# Patient Record
Sex: Male | Born: 1960 | Race: Black or African American | Hispanic: No | Marital: Single | Smoking: Current every day smoker
Health system: Southern US, Community
[De-identification: ages and names within clinical notes are randomized; demographics above are authoritative.]

## PROBLEM LIST (undated history)

## (undated) DIAGNOSIS — G40309 Generalized idiopathic epilepsy and epileptic syndromes, not intractable, without status epilepticus: Secondary | ICD-10-CM

## (undated) DIAGNOSIS — R569 Unspecified convulsions: Secondary | ICD-10-CM

## (undated) DIAGNOSIS — I499 Cardiac arrhythmia, unspecified: Secondary | ICD-10-CM

## (undated) DIAGNOSIS — R29818 Other symptoms and signs involving the nervous system: Secondary | ICD-10-CM

## (undated) DIAGNOSIS — Z8249 Family history of ischemic heart disease and other diseases of the circulatory system: Secondary | ICD-10-CM

## (undated) DIAGNOSIS — E785 Hyperlipidemia, unspecified: Secondary | ICD-10-CM

## (undated) DIAGNOSIS — I10 Essential (primary) hypertension: Secondary | ICD-10-CM

## (undated) DIAGNOSIS — I2510 Atherosclerotic heart disease of native coronary artery without angina pectoris: Secondary | ICD-10-CM

## (undated) DIAGNOSIS — K5900 Constipation, unspecified: Secondary | ICD-10-CM

## (undated) HISTORY — DX: Hyperlipidemia, unspecified: E78.5

## (undated) HISTORY — PX: NO PAST SURGERIES: SHX2092

## (undated) HISTORY — PX: OTHER SURGICAL HISTORY: SHX169

---

## 2010-05-06 ENCOUNTER — Emergency Department (HOSPITAL_COMMUNITY): Admission: EM | Admit: 2010-05-06 | Discharge: 2010-05-06 | Payer: Self-pay | Admitting: Emergency Medicine

## 2010-07-21 ENCOUNTER — Emergency Department (HOSPITAL_COMMUNITY): Admission: EM | Admit: 2010-07-21 | Discharge: 2010-07-21 | Payer: Self-pay | Admitting: Emergency Medicine

## 2010-09-11 DIAGNOSIS — G40909 Epilepsy, unspecified, not intractable, without status epilepticus: Secondary | ICD-10-CM | POA: Insufficient documentation

## 2010-09-17 DIAGNOSIS — F70 Mild intellectual disabilities: Secondary | ICD-10-CM | POA: Insufficient documentation

## 2010-09-17 DIAGNOSIS — K036 Deposits [accretions] on teeth: Secondary | ICD-10-CM | POA: Insufficient documentation

## 2010-09-17 DIAGNOSIS — D649 Anemia, unspecified: Secondary | ICD-10-CM | POA: Insufficient documentation

## 2010-09-17 DIAGNOSIS — I10 Essential (primary) hypertension: Secondary | ICD-10-CM | POA: Insufficient documentation

## 2010-10-23 DIAGNOSIS — R82998 Other abnormal findings in urine: Secondary | ICD-10-CM | POA: Insufficient documentation

## 2010-10-23 DIAGNOSIS — G40401 Other generalized epilepsy and epileptic syndromes, not intractable, with status epilepticus: Secondary | ICD-10-CM | POA: Insufficient documentation

## 2010-10-23 DIAGNOSIS — IMO0002 Reserved for concepts with insufficient information to code with codable children: Secondary | ICD-10-CM | POA: Insufficient documentation

## 2010-10-23 DIAGNOSIS — D72819 Decreased white blood cell count, unspecified: Secondary | ICD-10-CM | POA: Insufficient documentation

## 2010-10-23 DIAGNOSIS — D696 Thrombocytopenia, unspecified: Secondary | ICD-10-CM | POA: Insufficient documentation

## 2010-11-09 ENCOUNTER — Emergency Department (HOSPITAL_COMMUNITY)
Admission: EM | Admit: 2010-11-09 | Discharge: 2010-11-09 | Payer: Self-pay | Source: Home / Self Care | Admitting: Emergency Medicine

## 2010-11-12 LAB — POCT I-STAT, CHEM 8
Creatinine, Ser: 1.2 mg/dL (ref 0.4–1.5)
Glucose, Bld: 90 mg/dL (ref 70–99)
HCT: 41 % (ref 39.0–52.0)
Hemoglobin: 13.9 g/dL (ref 13.0–17.0)
TCO2: 30 mmol/L (ref 0–100)

## 2011-01-03 LAB — VALPROIC ACID LEVEL: Valproic Acid Lvl: 27.6 ug/mL — ABNORMAL LOW (ref 50.0–100.0)

## 2011-01-03 LAB — POCT I-STAT, CHEM 8
BUN: 7 mg/dL (ref 6–23)
Chloride: 102 mEq/L (ref 96–112)
Potassium: 4.4 mEq/L (ref 3.5–5.1)
Sodium: 139 mEq/L (ref 135–145)
TCO2: 29 mmol/L (ref 0–100)

## 2011-01-03 LAB — URINALYSIS, ROUTINE W REFLEX MICROSCOPIC
Bilirubin Urine: NEGATIVE
Glucose, UA: NEGATIVE mg/dL
Hgb urine dipstick: NEGATIVE
Ketones, ur: NEGATIVE mg/dL
Protein, ur: NEGATIVE mg/dL
pH: 8 (ref 5.0–8.0)

## 2011-01-05 LAB — BASIC METABOLIC PANEL
CO2: 27 mEq/L (ref 19–32)
Calcium: 9.3 mg/dL (ref 8.4–10.5)
GFR calc Af Amer: >60 mL/min (ref >60)
Potassium: 3.6 mEq/L (ref 3.5–5.1)
Sodium: 136 mEq/L (ref 135–145)

## 2011-01-05 LAB — VALPROIC ACID LEVEL: Valproic Acid Lvl: 12.5 ug/mL — ABNORMAL LOW (ref 50.0–100.0)

## 2011-11-02 ENCOUNTER — Encounter (HOSPITAL_COMMUNITY): Payer: Self-pay | Admitting: *Deleted

## 2011-11-02 ENCOUNTER — Emergency Department (HOSPITAL_COMMUNITY)
Admission: EM | Admit: 2011-11-02 | Discharge: 2011-11-02 | Disposition: A | Discharge status: Home / Self Care | Payer: Medicaid Other | Attending: Emergency Medicine | Admitting: Emergency Medicine

## 2011-11-02 DIAGNOSIS — G40909 Epilepsy, unspecified, not intractable, without status epilepticus: Secondary | ICD-10-CM

## 2011-11-02 DIAGNOSIS — R569 Unspecified convulsions: Secondary | ICD-10-CM | POA: Insufficient documentation

## 2011-11-02 HISTORY — DX: Generalized idiopathic epilepsy and epileptic syndromes, not intractable, without status epilepticus: G40.309

## 2011-11-02 LAB — CBC
HCT: 39.6 % (ref 39.0–52.0)
Platelets: 107 10*3/uL — ABNORMAL LOW (ref 150–400)
RBC: 5.57 MIL/uL (ref 4.22–5.81)
RDW: 16.5 % — ABNORMAL HIGH (ref 11.5–15.5)
WBC: 4.6 10*3/uL (ref 4.0–10.5)

## 2011-11-02 LAB — BASIC METABOLIC PANEL
BUN: 10 mg/dL (ref 6–23)
Chloride: 99 mEq/L (ref 96–112)
GFR calc Af Amer: >90 mL/min (ref >90)
Potassium: 4.1 mEq/L (ref 3.5–5.1)

## 2011-11-02 LAB — VALPROIC ACID LEVEL: Valproic Acid Lvl: 192.6 ug/mL (ref 50.0–100.0)

## 2011-11-02 MED ORDER — LORAZEPAM 1 MG PO TABS
1.0000 mg | ORAL_TABLET | Freq: Once | ORAL | Status: AC
  Administered 2011-11-02: 1 mg via ORAL
  Filled 2011-11-02: qty 1

## 2011-11-02 NOTE — ED Provider Notes (Signed)
History     CSN: [id number]  Arrival date & time 11/02/11  1526   First MD Initiated Contact with Patient 11/02/11 1742      Chief Complaint  Patient presents with  . Seizures    (Consider location/radiation/quality/duration/timing/severity/associated sxs/prior treatment) HPI History provided by pt who is a poor historian.  Pt had a witnessed seizure this evening.  His mother told him that he was unresponsive for approx 5 minutes while sitting in chair.  Did not fall.  No convulsions.  Pt reports that this is typical for him.  He has a seizure approx once a week.  Has not seen a neurologist since moving from [place] 1 year ago.  He is compliant w/ his depakote and keppra which his doctor had prescribed multiple refills for.  Was supposedly taken off of his dilantin last week in the ED but there is no record in prior chart.  No recent illnesses.  Currently feeling well.    Past Medical History  Diagnosis Date  . Epilepsy seizure, nonconvulsive, generalized     No past surgical history on file.  History reviewed. No pertinent family history.  History  Substance Use Topics  . Smoking status: Never Smoker   . Smokeless tobacco: Not on file  . Alcohol Use: No      Review of Systems  All other systems reviewed and are negative.    Allergies  Review of patient's allergies indicates no known allergies.  Home Medications   Current Outpatient Rx  Name Route Sig Dispense Refill  . DIVALPROEX SODIUM 500 MG PO TBEC Oral Take 500 mg by mouth 3 (three) times daily.    Marland Kitchen LEVETIRACETAM 1000 MG PO TABS Oral Take 1,000 mg by mouth 2 (two) times daily.      BP 125/79  Pulse 64  Temp(Src) 98.3 F (36.8 C) (Oral)  Resp 18  SpO2 97%  Physical Exam  Nursing note and vitals reviewed. Constitutional: He is oriented to person, place, and time. He appears well-developed and well-nourished. No distress.  HENT:  Head: Normocephalic and atraumatic.  Eyes:       Normal appearance    Neck: Normal range of motion.  Cardiovascular: Normal rate and regular rhythm.   Pulmonary/Chest: Effort normal and breath sounds normal.  Neurological: He is alert and oriented to person, place, and time. He has normal strength. No sensory deficit. Coordination normal. GCS eye subscore is 4. GCS verbal subscore is 5. GCS motor subscore is 6.  Skin: Skin is warm and dry. No rash noted.  Psychiatric: He has a normal mood and affect. His behavior is normal.    ED Course  Procedures (including critical care time)  Labs Reviewed  CBC - Abnormal; Notable for the following:    MCV 71.1 (*)    MCH 23.3 (*)    RDW 16.5 (*)    Platelets 107 (*) PLATELET COUNT CONFIRMED BY SMEAR   All other components within normal limits  BASIC METABOLIC PANEL - Abnormal; Notable for the following:    GFR calc non Af Amer 85 (*)    All other components within normal limits  VALPROIC ACID LEVEL - Abnormal; Notable for the following:    Valproic Acid Lvl 192.6 (*)    All other components within normal limits   No results found.   1. Seizure disorder       MDM  Pt w/ h/o seizure disorder presents w/ c/o typical seizure.  Has not seen a neurologist since  moving here from [place] 9yr ago but had several refills of all medications.  No acute findings on exam.  Labs unremarkable w/ exception of elevated valproic acid level.  Has been taking three 500mg  depakote TID but it has been prescribed one 500mg  TID.  No acidosis or electrolyte abnormalities.  Explained to patient how he should take his medication and recommended close f/u with neurology.  D/c'd home w/ referral to [facility].  Return precautions discussed.         Otilio Miu, [place] 11/03/11 [phone]

## 2011-11-02 NOTE — ED Notes (Signed)
Pharmacy Tech at bedside to reconcile medication.

## 2011-11-02 NOTE — ED Notes (Signed)
PA at bedside.

## 2011-11-02 NOTE — ED Notes (Signed)
Spoke with pt's niece Seth Brock) who will be coming to pick pt up.  Contact information: [phone]

## 2011-11-02 NOTE — ED Notes (Signed)
Patient with epileptic seizures and states that he blacked out for about 5 minutes this morning.  Patient is alert and oriented x 3.  Patient wants to make sure that he is medically ok.  Ambulatory at triage.

## 2011-11-02 NOTE — ED Notes (Signed)
Critical Lab value: Valproic Acid 192.6 from [facility].  Will notify MD

## 2011-11-02 NOTE — ED Notes (Signed)
PA Schinlever has been notified regarding critical lab value (Valproic Acid)

## 2011-11-03 NOTE — ED Provider Notes (Signed)
Medical screening examination/treatment/procedure(s) were performed by non-physician practitioner and as supervising physician I was immediately available for consultation/collaboration.  Loren Racer, MD 11/03/11 2328

## 2012-03-15 ENCOUNTER — Encounter (HOSPITAL_COMMUNITY): Payer: Self-pay | Admitting: Emergency Medicine

## 2012-03-15 ENCOUNTER — Emergency Department (HOSPITAL_COMMUNITY)
Admission: EM | Admit: 2012-03-15 | Discharge: 2012-03-15 | Disposition: A | Discharge status: Home / Self Care | Payer: Medicaid Other | Attending: Emergency Medicine | Admitting: Emergency Medicine

## 2012-03-15 DIAGNOSIS — G40909 Epilepsy, unspecified, not intractable, without status epilepticus: Secondary | ICD-10-CM | POA: Insufficient documentation

## 2012-03-15 DIAGNOSIS — R7889 Finding of other specified substances, not normally found in blood: Secondary | ICD-10-CM

## 2012-03-15 DIAGNOSIS — R569 Unspecified convulsions: Secondary | ICD-10-CM

## 2012-03-15 DIAGNOSIS — R7989 Other specified abnormal findings of blood chemistry: Secondary | ICD-10-CM | POA: Insufficient documentation

## 2012-03-15 DIAGNOSIS — Z79899 Other long term (current) drug therapy: Secondary | ICD-10-CM | POA: Insufficient documentation

## 2012-03-15 LAB — VALPROIC ACID LEVEL: Valproic Acid Lvl: 140.4 ug/mL — ABNORMAL HIGH (ref 50.0–100.0)

## 2012-03-15 LAB — BASIC METABOLIC PANEL
BUN: 11 mg/dL (ref 6–23)
CO2: 27 mEq/L (ref 19–32)
Calcium: 9.5 mg/dL (ref 8.4–10.5)
Chloride: 102 mEq/L (ref 96–112)
Creatinine, Ser: 1.03 mg/dL (ref 0.50–1.35)
GFR calc Af Amer: >90 mL/min (ref >90)
GFR calc non Af Amer: 84 mL/min — ABNORMAL LOW (ref >90)
Glucose, Bld: 79 mg/dL (ref 70–99)
Potassium: 4.6 mEq/L (ref 3.5–5.1)
Sodium: 137 mEq/L (ref 135–145)

## 2012-03-15 NOTE — ED Provider Notes (Signed)
History    50yM with seizure. Pt with hx of seizure disorder. Remembers going to store to get food today and then next thing remembers is people around him. Amnestic to exact events. Currently no complaints. Reports compliance with keppra and dilantin. No fever. No trauma. No numbness, tingling or loss of strength. Denies pain anwyhere. No neck stiffness.   CSN: [id number]  Arrival date & time 03/15/12  1357   First MD Initiated Contact with Patient 03/15/12 1455      Chief Complaint  Patient presents with  . Seizures    (Consider location/radiation/quality/duration/timing/severity/associated sxs/prior treatment) HPI  Past Medical History  Diagnosis Date  . Epilepsy seizure, nonconvulsive, generalized     No past surgical history on file.  No family history on file.  History  Substance Use Topics  . Smoking status: Never Smoker   . Smokeless tobacco: Not on file  . Alcohol Use: No      Review of Systems   Review of symptoms negative unless otherwise noted in HPI.   Allergies  Review of patient's allergies indicates no known allergies.  Home Medications   Current Outpatient Rx  Name Route Sig Dispense Refill  . DIVALPROEX SODIUM 500 MG PO TBEC Oral Take 500 mg by mouth 3 (three) times daily.    Marland Kitchen LEVETIRACETAM 1000 MG PO TABS Oral Take 2,000 mg by mouth 2 (two) times daily.       BP 124/74  Pulse 48  Temp(Src) 98.4 F (36.9 C) (Oral)  Resp 20  SpO2 99%  Physical Exam  Nursing note and vitals reviewed. Constitutional: He appears well-developed and well-nourished. No distress.       Laying in bed watching tv. NAD.No external signs of acute trauma.  HENT:  Head: Normocephalic and atraumatic.  Eyes: Conjunctivae and EOM are normal. Pupils are equal, round, and reactive to light. Right eye exhibits no discharge. Left eye exhibits no discharge.  Neck: Normal range of motion. Neck supple.  Cardiovascular: Normal rate, regular rhythm and normal heart sounds.   Exam reveals no gallop and no friction rub.   No murmur heard. Pulmonary/Chest: Effort normal and breath sounds normal. No respiratory distress.  Abdominal: Soft. He exhibits no distension. There is no tenderness.  Musculoskeletal: He exhibits no edema and no tenderness.  Lymphadenopathy:    He has no cervical adenopathy.  Neurological: He is alert. No cranial nerve deficit. He exhibits normal muscle tone. Coordination normal.       Strength 5/5 b/l u/l ext. Good finger to nose b/l.  Skin: Skin is warm and dry.  Psychiatric: Thought content normal.       Appears to have MR. Answering questions appropriately though.    ED Course  Procedures (including critical care time)  Labs Reviewed  BASIC METABOLIC PANEL - Abnormal; Notable for the following:    GFR calc non Af Amer 84 (*)    All other components within normal limits  VALPROIC ACID LEVEL - Abnormal; Notable for the following:    Valproic Acid Lvl 140.4 (*)    All other components within normal limits  LAB REPORT - SCANNED   No results found.   1. Elevated Dilantin level   2. Seizure       MDM  50yM with seizure. Hx of same. Back to baseline and nonfocal exam. Dilantin supratherapeutic. Noted on previous eval but lower than that evaluation. W/u otherwise failry unremarkable. Pt reports taking meds as prescribed. Instructed to decrease dose for a couple days  and then resume as prescribed. Neurology referral provided for close fu. Return precautions discussed.        Raeford Razor, MD 03/18/12 [phone]

## 2012-03-15 NOTE — ED Notes (Signed)
Pt's mother was notified that pt was in the ER.  Mother told this writer to let her know when he is on his way home.

## 2012-03-15 NOTE — ED Notes (Signed)
[id number]<AV> Expected date:<BR> Expected time: 1:42 PM<BR> Means of arrival:Ambulance<BR> Comments:<BR> M90 -- Seizure

## 2012-03-15 NOTE — ED Notes (Signed)
Per EMS: Pt coming from grocery store.  Has developmental delays.  Hx of seizures.  Lives with parents.  Takes dilantin, depakote, and capri for his seizures.  Witnessed seizure about a minute long.  Pt woke up and was alert but could not answer questions.  Able to answer questions about 10 minutes after seizure subsided.

## 2012-03-15 NOTE — Discharge Instructions (Signed)
Epilepsy  A seizure (convulsion) is a sudden change in brain function that causes a change in behavior, muscle activity, or ability to remain awake and alert. If a person has recurring seizures, this is called epilepsy.  CAUSES   Epilepsy is a disorder with many possible causes. Anything that disturbs the normal pattern of brain cell activity can lead to seizures. Seizure can be caused from illness to brain damage to abnormal brain development. Epilepsy may develop because of:   An abnormality in brain wiring.   An imbalance of nerve signaling chemicals (neurotransmitters).   Some combination of these factors.  Scientists are learning an increasing amount about genetic causes of seizures.  SYMPTOMS   The symptoms of a seizure can vary greatly from one person to another. These may include:   An aura, or warning that tells a person they are about to have a seizure.   Abnormal sensations, such as abnormal smell or seeing flashing lights.   Sudden, general body stiffness.   Rhythmic jerking of the face, arm, or leg - on one or both sides.   Sudden change in consciousness.   The person may appear to be awake but not responding.   They may appear to be asleep but cannot be awakened.   Grimacing, chewing, lip smacking, or drooling.   Often there is a period of sleepiness after a seizure.  DIAGNOSIS   The description you give to your caregiver about what you experienced will help them understand your problems. Equally important is the description by any witnesses to your seizure. A physical exam, including a detailed neurological exam, is necessary. An EEG (electroencephalogram) is a painless test of your brain waves. In this test a diagram is created of your brain waves. These diagrams can be interpreted by a specialist. Pictures of your brain are usually taken with:   An MRI.   A CT scan.  Lab tests may be done to look for:   Signs of infection.   Abnormal blood chemistry.  PREVENTION   There is no way to  prevent the development of epilepsy. If you have seizures that are typically triggered by an event (such as flashing lights), try to avoid the trigger. This can help you avoid a seizure.   PROGNOSIS   Most people with epilepsy lead outwardly normal lives. While epilepsy cannot currently be cured, for some people it does eventually go away. Most seizures do not cause brain damage. It is not uncommon for people with epilepsy, especially children, to develop behavioral and emotional problems. These problems are sometimes the consequence of medicine for seizures or social stress. For some people with epilepsy, the risk of seizures restricts their independence and recreational activities. For example, some states refuse drivers licenses to people with epilepsy.  Most women with epilepsy can become pregnant. They should discuss their epilepsy and the medicine they are taking with their caregivers. Women with epilepsy have a 90 percent or better chance of having a normal, healthy baby.  RISKS AND COMPLICATIONS   People with epilepsy are at increased risk of falls, accidents, and injuries. People with epilepsy are at special risk for two life-threatening conditions. These are status epilepticus and sudden unexplained death (extremely rare). Status epilepticus is a long lasting, continuous seizure that is a medical emergency.  TREATMENT   Once epilepsy is diagnosed, it is important to begin treatment as soon as possible. For about 80 percent of those diagnosed with epilepsy, seizures can be controlled with modern medicines   FDA approved a pacemaker for the brain the (vagus nerve stimulator). This stimulator can be used for people with seizures that are not well-controlled by medicine. Studies have shown that in some cases, children may experience fewer seizures if they maintain a  strict diet. The strict diet is called the ketogenic diet. This diet is rich in fats and low in carbohydrates. HOME CARE INSTRUCTIONS   Your caregiver will make recommendations about driving and safety in normal activities. Follow these carefully.   Take any medicine prescribed exactly as directed.   Do any blood tests requested to monitor the levels of your medicine.   The people you live and work with should know that you are prone to seizures. They should receive instructions on how to help you. In general, a witness to a seizure should:   Cushion your head and body.   Turn you on your side.   Avoid unnecessarily restraining you.   Not place anything inside your mouth.   Call for local emergency medical help if there is any question about what has occurred.   Keep a seizure diary. Record what you recall about any seizure, especially any possible trigger.   If your caregiver has given you a follow-up appointment, it is very important to keep that appointment. Not keeping the appointment could result in permanent injury and disability. If there is any problem keeping the appointment, you must call back to this facility for assistance.  SEEK MEDICAL CARE IF:   You develop signs of infection or other illness. This might increase the risk of a seizure.   You seem to be having more frequent seizures.   Your seizure pattern is changing.  SEEK IMMEDIATE MEDICAL CARE IF:   A seizure does not stop after a few moments.   A seizure causes any difficulty in breathing.   A seizure results in a very severe headache.   A seizure leaves you with the inability to speak or use a part of your body.  MAKE SURE YOU:   Understand these instructions.   Will watch your condition.   Will get help right away if you are not doing well or get worse.  Document Released: 10/07/2005 Document Revised: 09/26/2011 Document Reviewed: 05/13/2008 [facility] Patient Information 2012 [place],  [place].  RESOURCE GUIDE  Dental Problems  Patients with Medicaid: [facility] [phone] [address]                                           [phone] [address] Phone:  [phone]                                                  Phone:  [phone]  If unable to pay or uninsured, contact:  [facility] or [facility]. to become qualified for the adult dental clinic.  Chronic Pain Problems Contact [facility]  [phone] Patients need to be referred by their primary care doctor.  Insufficient Money for Medicine Contact [employer]:  call "211" or [facility] (801) 172-0986.  No Primary Care Doctor Call [facility]  [phone] Other agencies that provide inexpensive medical care    [facility]  [phone]    [facility]  [phone]    [facility]  [phone]    [facility]  [phone]    [facility]  229-009-2014    [facility]  [phone]  Psychological Services [facility] Behavioral Health  [phone] [facility]  [phone] [facility]   [phone] (emergency services [phone])  Substance Abuse Resources [facility]  ([phone] [facility] [phone] [facility] [place] [phone] Floydene Flock [phone] Residential & Outpatient Substance Abuse Program  7314342224  Abuse/Neglect [facility] Child Abuse Hotline 959-085-4901 [facility] Child Abuse Hotline [phone] (After Hours)  Emergency Shelter [facility] 801-203-9372  M[facility] Homes [facility] [facility] [phone] [facility] ([phone]  MRSA Hotline #:   203-061-9396    [facility] Resources  [facility]     [employer]                          [facility] Dept. [address] [place]                       [address]      [address] [place] [address]  Blondell Reveal Phone:  [phone]                                   Phone:  [phone]                 Phone:  [phone]  [facility] Mental Health Phone:  [phone]  [facility] Child Abuse Hotline 717-460-3502 [phone] (After Hours)

## 2012-10-19 ENCOUNTER — Emergency Department (HOSPITAL_COMMUNITY)
Admission: EM | Admit: 2012-10-19 | Discharge: 2012-10-19 | Disposition: A | Discharge status: Home / Self Care | Payer: Medicaid Other | Attending: Emergency Medicine | Admitting: Emergency Medicine

## 2012-10-19 ENCOUNTER — Encounter (HOSPITAL_COMMUNITY): Payer: Self-pay | Admitting: *Deleted

## 2012-10-19 DIAGNOSIS — G40909 Epilepsy, unspecified, not intractable, without status epilepticus: Secondary | ICD-10-CM | POA: Insufficient documentation

## 2012-10-19 DIAGNOSIS — Z79899 Other long term (current) drug therapy: Secondary | ICD-10-CM | POA: Insufficient documentation

## 2012-10-19 DIAGNOSIS — Y9389 Activity, other specified: Secondary | ICD-10-CM | POA: Insufficient documentation

## 2012-10-19 DIAGNOSIS — X58XXXA Exposure to other specified factors, initial encounter: Secondary | ICD-10-CM | POA: Insufficient documentation

## 2012-10-19 DIAGNOSIS — IMO0002 Reserved for concepts with insufficient information to code with codable children: Secondary | ICD-10-CM | POA: Insufficient documentation

## 2012-10-19 DIAGNOSIS — R569 Unspecified convulsions: Secondary | ICD-10-CM

## 2012-10-19 DIAGNOSIS — Y929 Unspecified place or not applicable: Secondary | ICD-10-CM | POA: Insufficient documentation

## 2012-10-19 LAB — PHENYTOIN LEVEL, TOTAL: Phenytoin Lvl: <2.5 ug/mL — ABNORMAL LOW (ref 10.0–20.0)

## 2012-10-19 LAB — BASIC METABOLIC PANEL
BUN: 13 mg/dL (ref 6–23)
CO2: 28 mEq/L (ref 19–32)
Calcium: 9.6 mg/dL (ref 8.4–10.5)
GFR calc non Af Amer: >90 mL/min (ref >90)
Glucose, Bld: 95 mg/dL (ref 70–99)

## 2012-10-19 LAB — CBC
Hemoglobin: 12.1 g/dL — ABNORMAL LOW (ref 13.0–17.0)
MCH: 24.7 pg — ABNORMAL LOW (ref 26.0–34.0)
MCHC: 32.8 g/dL (ref 30.0–36.0)
MCV: 75.5 fL — ABNORMAL LOW (ref 78.0–100.0)
Platelets: 202 10*3/uL (ref 150–400)
RBC: 4.89 MIL/uL (ref 4.22–5.81)

## 2012-10-19 MED ORDER — PHENYTOIN SODIUM EXTENDED 30 MG PO CAPS
250.0000 mg | ORAL_CAPSULE | Freq: Once | ORAL | Status: AC
  Administered 2012-10-19: 250 mg via ORAL
  Filled 2012-10-19: qty 5

## 2012-10-19 NOTE — ED Notes (Signed)
Pt's Aunt called on his cell phone, pt instructed me to answer and talk to the person on the phone. Informed pt's aunt that he had a seizure and was found on the sidewalk and EMS brought pt to ED for further evaluation.

## 2012-10-19 NOTE — ED Notes (Signed)
[id number]<AO> Expected date:<BR> Expected time:<BR> Means of arrival:<BR> Comments:<BR> EMS

## 2012-10-19 NOTE — ED Notes (Signed)
ZOX:[id number]<AV> Expected date:<BR> Expected time:<BR> Means of arrival:<BR> Comments:<BR> Triage 4

## 2012-10-19 NOTE — ED Notes (Signed)
Per EMS states someone driving by saw him laying on side walk shaking and called 911, lasted 30 sec-1 min, was walking when EMS arrived, was post-ictal, hx of seizure, last seizure was last month, usually has one every month BP 128/76, HR 82, CBG 105, RR 18. Takes Dilantin and Phenobarbital.

## 2012-10-19 NOTE — ED Notes (Signed)
Pt states he was outside and fell on side walk and had a seizure, denies pain, denies headache/blurred vision/dizziness. Abrasions on knuckles on hands.

## 2012-10-19 NOTE — ED Provider Notes (Signed)
History     CSN: [id number]  Arrival date & time 10/19/12  1708   First MD Initiated Contact with Patient 10/19/12 1817      Chief Complaint  Patient presents with  . Seizures    (Consider location/radiation/quality/duration/timing/severity/associated sxs/prior treatment) Patient is a 51 y.o. male presenting with seizures. The history is provided by the patient, medical records and the EMS personnel.  Seizures  Pertinent negatives include no headaches, no visual disturbance, no chest pain, no cough, no nausea, no vomiting and no diarrhea.    Seth Brock is a 51 y.o. male  with a hx of epilepsy presents to the Emergency Department complaining of acute, spontaneously resolved seizure onset 5pm.  Pt states he was walking on the sidewalk when he blacked out and had a seizure.  He states this happens approximately 1x per month. He does not get an aura and he has never had to come to the hospital for seizures. Associated symptoms include  None. Pt takes Keppra and Dilantin for his seizures, has been compliant and did take them today. Nothing makes it better and nothing makes it worse.  Pt denies headache, neck pain, back pain, chest pain, shortness of breath, abdominal pain, nausea, vomiting, diarrhea, fever or chills.  Pt's mother goes with him to the doctor.  He states he feels good.     Past Medical History  Diagnosis Date  . Epilepsy seizure, nonconvulsive, generalized     History reviewed. No pertinent past surgical history.  No family history on file.  History  Substance Use Topics  . Smoking status: Never Smoker   . Smokeless tobacco: Not on file  . Alcohol Use: No      Review of Systems  Constitutional: Negative for fever, diaphoresis, appetite change, fatigue and unexpected weight change.  HENT: Negative for mouth sores, neck pain and neck stiffness.   Eyes: Negative for visual disturbance.  Respiratory: Negative for cough, chest tightness, shortness of breath and  wheezing.   Cardiovascular: Negative for chest pain.  Gastrointestinal: Negative for nausea, vomiting, abdominal pain, diarrhea and constipation.  Genitourinary: Negative for dysuria, urgency, frequency and hematuria.  Musculoskeletal: Negative for back pain.  Skin: Negative for rash.  Neurological: Positive for seizures. Negative for syncope, light-headedness and headaches.  Psychiatric/Behavioral: Negative for sleep disturbance. The patient is not nervous/anxious.   All other systems reviewed and are negative.    Allergies  Review of patient's allergies indicates no known allergies.  Home Medications   Current Outpatient Rx  Name  Route  Sig  Dispense  Refill  . DIVALPROEX SODIUM 500 MG PO TBEC   Oral   Take 500 mg by mouth 3 (three) times daily.         Marland Kitchen LACOSAMIDE 100 MG PO TABS   Oral   Take 1 tablet by mouth 2 (two) times daily.         Marland Kitchen LEVETIRACETAM 1000 MG PO TABS   Oral   Take 2,000 mg by mouth 2 (two) times daily.            BP 120/79  Pulse 62  Temp 99.2 F (37.3 C) (Oral)  Resp 21  SpO2 96%  Physical Exam  Nursing note and vitals reviewed. Constitutional: He is oriented to person, place, and time. He appears well-developed and well-nourished. No distress.  HENT:  Head: Normocephalic and atraumatic.  Right Ear: Tympanic membrane, external ear and ear canal normal.  Left Ear: Tympanic membrane, external ear and ear canal  normal.  Nose: Nose normal.  Mouth/Throat: Uvula is midline, oropharynx is clear and moist and mucous membranes are normal. No oropharyngeal exudate.  Eyes: Conjunctivae normal and EOM are normal. Pupils are equal, round, and reactive to light. No scleral icterus.  Neck: Normal range of motion. Neck supple.  Cardiovascular: Normal rate, regular rhythm, normal heart sounds and intact distal pulses.  Exam reveals no gallop and no friction rub.   No murmur heard. Pulmonary/Chest: Effort normal and breath sounds normal. No  respiratory distress. He has no wheezes. He has no rales. He exhibits no tenderness.  Abdominal: Soft. Bowel sounds are normal. He exhibits no distension and no mass. There is no tenderness. There is no rebound and no guarding.  Musculoskeletal: Normal range of motion. He exhibits no edema.       Arms:      Minor abrasions to the dorsum of the right hand  Lymphadenopathy:    He has no cervical adenopathy.  Neurological: He is alert and oriented to person, place, and time. He has normal reflexes. No cranial nerve deficit. He exhibits normal muscle tone. Coordination normal.       Speech is clear and goal oriented, follows commands Major Cranial nerves without deficit, no facial droop Normal strength in upper and lower extremities bilaterally including dorsiflexion and plantar flexion, strong and equal grip strength Sensation normal to light and sharp touch Moves extremities without ataxia, coordination intact Normal finger to nose and rapid alternating movements Neg romberg, no pronator drift Normal gait and balance  Skin: Skin is warm and dry. No rash noted. He is not diaphoretic. No erythema.  Psychiatric: He has a normal mood and affect.    ED Course  Procedures (including critical care time)  Labs Reviewed  CBC - Abnormal; Notable for the following:    WBC 3.3 (*)     Hemoglobin 12.1 (*)     HCT 36.9 (*)     MCV 75.5 (*)     MCH 24.7 (*)     All other components within normal limits  PHENYTOIN LEVEL, TOTAL - Abnormal; Notable for the following:    Phenytoin Lvl <2.5 (*)     All other components within normal limits  PHENOBARBITAL LEVEL - Abnormal; Notable for the following:    Phenobarbital <2.4 (*)     All other components within normal limits  BASIC METABOLIC PANEL   No results found.   1. Seizures       MDM  [name] presents with a Hx of seizures and a witnessed seizure today.  Pt is without complaint, alert and oriented, NAD, non-toxic, non-septic appearing,  stating he is ready to go home.  Awaiting dilantin levels.  Neurologist is Levert Feinstein, MD.    CBC with mild leukopenia and mild anemia the patient's baseline; CMP unremarkable. Patient Dilantin level low, less than 2.5.  Will load him with 500 mg here and have him repeat in 2 hours when he returns home.  Also discussed with him the need to followup with his neurologist.  I have also discussed reasons to return immediately to the ER.  Patient expresses understanding and agrees with plan.  1. Medications: Dilantin 500mg  by mouth when you get home then usual home medications as prescribed 2. Treatment: rest, drink plenty of fluids, take medications as prescribed  3. Follow Up: Please followup with your primary doctor for discussion of your diagnoses and further evaluation after today's visit; if you do not have a primary care doctor  use the resource guide provided to find one; follow-up with your neurologlist        Dierdre Forth, PA-C 10/19/12 2020

## 2012-10-19 NOTE — ED Notes (Signed)
Waiting on Dilantin from Pharmacy

## 2012-10-20 NOTE — ED Provider Notes (Signed)
Medical screening examination/treatment/procedure(s) were performed by non-physician practitioner and as supervising physician I was immediately available for consultation/collaboration.  Hurman Horn, MD 10/20/12 [phone]

## 2013-01-05 ENCOUNTER — Telehealth: Payer: Self-pay | Admitting: *Deleted

## 2013-01-05 NOTE — Telephone Encounter (Signed)
Seth Brock, I called [name] and she was unavailable. I changed his Vimpat to 150mg  BID on March 4th. It was called to his pharmacy. He needs to increase his dose to that.

## 2013-01-06 MED ORDER — DIVALPROEX SODIUM 500 MG PO DR TAB: 500.0000 mg | DELAYED_RELEASE_TABLET | Freq: Two times a day (BID) | ORAL | Status: DC

## 2013-01-06 NOTE — Telephone Encounter (Signed)
Spoke to the aunt and she is stating that the patient is taking 100 mg of the Vimpat. She also stated that when the pt went to pick up his medication he asked for the 100 mg because that's what the NP has told him to take. The aunt is very confused about what he is suppose to be taking. She will call the patient to find out what he actually picked up.

## 2013-01-06 NOTE — Telephone Encounter (Signed)
I thought she was the person responsible for his taking the meds appropriately. Please let me know if he did not pick up right RX.when she calls you back.

## 2013-01-07 NOTE — Telephone Encounter (Signed)
The pt aunt called back and said she called the pharmacy and the 150 has not been approved yet. The pt is still taking the 100 mg and when he gets the other one approved he will start taking that.

## 2013-01-25 DIAGNOSIS — Z0289 Encounter for other administrative examinations: Secondary | ICD-10-CM

## 2013-02-09 ENCOUNTER — Encounter: Payer: Self-pay | Admitting: *Deleted

## 2013-02-09 NOTE — Telephone Encounter (Signed)
Patient aunt is calling wanting to know if the patient form was received and filled out. She called yesterday and someone called her back but she missed the call. Please advise.

## 2013-02-10 NOTE — Telephone Encounter (Signed)
Patient is calling again about a form. Please call the patient.

## 2013-02-10 NOTE — Telephone Encounter (Signed)
This encounter was created in error - please disregard.

## 2013-03-03 ENCOUNTER — Other Ambulatory Visit: Payer: Self-pay | Admitting: Neurology

## 2013-03-10 ENCOUNTER — Telehealth: Payer: Self-pay

## 2013-03-10 NOTE — Telephone Encounter (Signed)
I do not want pt on Delayed release, I want him on ER so he gets better coverage.

## 2013-03-10 NOTE — Telephone Encounter (Signed)
I called the pharmacy.  Spoke with Robin.  She said the patients mom said she and the patient were getting confused with the medication.  Says they did refill the ER and patient picked up meds.  They are continuing ER.  I called patient, got no answer.  Left message.

## 2013-03-10 NOTE — Telephone Encounter (Signed)
[facility] [place] [facility]ing saying the patient requested they contact us for a drug change.  Patient told them he has taken Depakote DR (Delayed Release) in the past and he "likes it better" than the ER (Extended Release) we have been prescribing since June 2013 (according to [employer]).  It is listed in Epic as DR, although that is not what we have been sending.  Would you like to change medication?  Please advise.  Thank you.

## 2013-04-01 ENCOUNTER — Ambulatory Visit: Payer: Self-pay | Admitting: Nurse Practitioner

## 2013-06-08 ENCOUNTER — Telehealth: Payer: Self-pay | Admitting: Nurse Practitioner

## 2013-06-11 ENCOUNTER — Ambulatory Visit (INDEPENDENT_AMBULATORY_CARE_PROVIDER_SITE_OTHER): Payer: Medicaid Other | Admitting: Nurse Practitioner

## 2013-06-11 ENCOUNTER — Encounter: Payer: Self-pay | Admitting: Nurse Practitioner

## 2013-06-11 VITALS — BP 133/77 | HR 61 | Ht 72.0 in | Wt 254.0 lb

## 2013-06-11 DIAGNOSIS — R569 Unspecified convulsions: Secondary | ICD-10-CM

## 2013-06-11 DIAGNOSIS — Z79899 Other long term (current) drug therapy: Secondary | ICD-10-CM

## 2013-06-11 DIAGNOSIS — G40209 Localization-related (focal) (partial) symptomatic epilepsy and epileptic syndromes with complex partial seizures, not intractable, without status epilepticus: Secondary | ICD-10-CM

## 2013-06-11 MED ORDER — DIVALPROEX SODIUM ER 500 MG PO TB24: 500.0000 mg | ORAL_TABLET | Freq: Two times a day (BID) | ORAL | Status: DC

## 2013-06-11 NOTE — Patient Instructions (Addendum)
Continue Vimpat 150 twice daily Continue Keppra 1000 mg 2 tabs twice daily Continue Depakote 500 ER one twice daily Will obtain labs Followup in 6 months
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
## 2013-06-15 LAB — COMPREHENSIVE METABOLIC PANEL
ALT: 28 IU/L (ref 0–44)
AST: 25 IU/L (ref 0–40)
Alkaline Phosphatase: 52 IU/L (ref 39–117)
BUN/Creatinine Ratio: 9 (ref 9–20)
CO2: 23 mmol/L (ref 18–29)
Calcium: 9.8 mg/dL (ref 8.7–10.2)
Chloride: 101 mmol/L (ref 96–108)
Potassium: 4.5 mmol/L (ref 3.5–5.2)
Sodium: 138 mmol/L (ref 134–144)

## 2013-06-15 LAB — CBC WITH DIFFERENTIAL/PLATELET
Basophils Absolute: 0 10*3/uL (ref 0.0–0.2)
Basos: 1 % (ref 0–3)
Eosinophils Absolute: 0.1 10*3/uL (ref 0.0–0.4)
Hemoglobin: 11.9 g/dL — ABNORMAL LOW (ref 12.6–17.7)
Lymphs: 62 % — ABNORMAL HIGH (ref 14–46)
MCH: 23.9 pg — ABNORMAL LOW (ref 26.6–33.0)
MCHC: 34 g/dL (ref 31.5–35.7)
MCV: 70 fL — ABNORMAL LOW (ref 79–97)
Monocytes Absolute: 0.3 10*3/uL (ref 0.1–0.9)
Neutrophils Absolute: 0.7 10*3/uL — ABNORMAL LOW (ref 1.4–7.0)
RBC: 4.97 x10E6/uL (ref 4.14–5.80)

## 2013-06-15 LAB — VALPROIC ACID LEVEL: Valproic Acid Lvl: 104 ug/mL — ABNORMAL HIGH (ref 50–100)

## 2013-06-18 NOTE — Progress Notes (Signed)
Quick Note:  Spoke with patient and relayed results of blood work. The patient was also reminded of any future appointments. Patient understood and had no questions.  ______ 

## 2013-07-09 ENCOUNTER — Other Ambulatory Visit: Payer: Self-pay | Admitting: Nurse Practitioner

## 2013-07-12 ENCOUNTER — Telehealth: Payer: Self-pay | Admitting: Nurse Practitioner

## 2013-07-12 NOTE — Telephone Encounter (Signed)
Rx signed and faxed.

## 2013-07-12 NOTE — Telephone Encounter (Signed)
Duplicate Message.  This was already taken care of.

## 2013-07-20 ENCOUNTER — Telehealth: Payer: Self-pay | Admitting: Nurse Practitioner

## 2013-07-20 NOTE — Telephone Encounter (Signed)
Rx was already sent last week.  We have faxed confirmation.  I called the patient back, was told by the lady who answered the phone he was not at home, he was actually at our office.  I called the front desk, they verified he is here.  They will relay message to the patient that Rx was already sent last week and he should follow up with his pharmacy regarding when it is ready for pick up.

## 2013-08-15 ENCOUNTER — Emergency Department (INDEPENDENT_AMBULATORY_CARE_PROVIDER_SITE_OTHER)
Admission: EM | Admit: 2013-08-15 | Discharge: 2013-08-15 | Disposition: A | Discharge status: Home / Self Care | Payer: Medicaid Other | Source: Home / Self Care | Attending: Family Medicine | Admitting: Family Medicine

## 2013-08-15 ENCOUNTER — Encounter (HOSPITAL_COMMUNITY): Payer: Self-pay | Admitting: Emergency Medicine

## 2013-08-15 DIAGNOSIS — M549 Dorsalgia, unspecified: Secondary | ICD-10-CM

## 2013-08-15 DIAGNOSIS — M79609 Pain in unspecified limb: Secondary | ICD-10-CM

## 2013-08-15 DIAGNOSIS — M79604 Pain in right leg: Secondary | ICD-10-CM

## 2013-08-15 MED ORDER — MELOXICAM 15 MG PO TABS: 15.0000 mg | ORAL_TABLET | Freq: Every day | ORAL | Status: DC

## 2013-08-15 NOTE — ED Provider Notes (Signed)
CSN: [id number]     Arrival date & time 08/15/13  1332 History   First MD Initiated Contact with Patient 08/15/13 1435     Chief Complaint  Patient presents with  . Leg Pain   (Consider location/radiation/quality/duration/timing/severity/associated sxs/prior Treatment) HPI Comments: 52 year old male with a history of epilepsy presents complaining of right lower back and leg pain. He says he has frequent seizures and does not know if he has fallen and caused himself to develop pain. He says he often cannot remember if he has seizures or not. He has had them since age 8. He is currently on 3 antiepileptic medications. He says the pain starts in his lower back and goes out and down to his leg. He also has pain in the lateral right ankle. It is not tender to touch but is painful with ambulation and when he first gets up in the morning. Denies any weakness in the leg. Denies loss of bowel or bladder control.  Patient is a 52 y.o. male presenting with leg pain.  Leg Pain Associated symptoms: no fatigue, no fever and no neck pain     Past Medical History  Diagnosis Date  . Epilepsy seizure, nonconvulsive, generalized    Past Surgical History  Procedure Laterality Date  . None listed     History reviewed. No pertinent family history. History  Substance Use Topics  . Smoking status: Former Games developer  . Smokeless tobacco: Never Used  . Alcohol Use: No     Comment: patient quit in 1993     Review of Systems  Constitutional: Negative for fever, chills and fatigue.  HENT: Negative for sore throat.   Eyes: Negative for visual disturbance.  Respiratory: Negative for cough and shortness of breath.   Cardiovascular: Negative for chest pain, palpitations and leg swelling.  Gastrointestinal: Negative for nausea, vomiting, abdominal pain, diarrhea and constipation.  Genitourinary: Negative for dysuria, urgency, frequency and hematuria.  Musculoskeletal: Positive for arthralgias, gait problem and  myalgias. Negative for neck pain and neck stiffness.  Skin: Negative for rash.  Neurological: Positive for seizures. Negative for dizziness, weakness and light-headedness.    Allergies  Review of patient's allergies indicates no known allergies.  Home Medications   Current Outpatient Rx  Name  Route  Sig  Dispense  Refill  . divalproex (DEPAKOTE ER) 500 MG 24 hr tablet   Oral   Take 1 tablet (500 mg total) by mouth 2 (two) times daily.   60 tablet   6   . levETIRAcetam (KEPPRA) 1000 MG tablet      TAKE 2 TABLETS BY MOUTH TWICE A DAY   120 tablet   5   . meloxicam (MOBIC) 15 MG tablet   Oral   Take 1 tablet (15 mg total) by mouth daily.   30 tablet   0   . VIMPAT 150 MG TABS      TAKE ONE TABLET BY MOUTH TWICE DAILY   60 each   5     Pharmacy Fax [phone]    BP 130/70  Pulse 64  Temp(Src) 98 F (36.7 C) (Oral)  Resp 20  SpO2 100% Physical Exam  Nursing note and vitals reviewed. Constitutional: He is oriented to person, place, and time. He appears well-developed and well-nourished. No distress.  HENT:  Head: Normocephalic.  Pulmonary/Chest: Effort normal. No respiratory distress.  Musculoskeletal:       Right hip: Normal.       Right knee: Normal.  Right ankle: Normal.       Lumbar back: Normal.  Neurological: He is alert and oriented to person, place, and time. Coordination normal.  Skin: Skin is warm and dry. No rash noted. He is not diaphoretic.  Psychiatric: Judgment normal. His affect is inappropriate (strange affect, likely due to long history of poorly controlled seizures).    ED Course  Procedures (including critical care time) Labs Review Labs Reviewed - No data to display Imaging Review No results found.    MDM   1. Back pain   2. Leg pain, right    Likely just a temporary muscle strain or traumatic arthritis. Treating symptomatically with meloxicam. Followup if not improving. Physical exam is completely normal. Gait is normal,  range of motion and strength are normal. He does have a neurologist he sees regularly  Meds ordered this encounter  Medications  . meloxicam (MOBIC) 15 MG tablet    Sig: Take 1 tablet (15 mg total) by mouth daily.    Dispense:  30 tablet    Refill:  0    Order Specific Question:  Supervising Provider    Answer:  Clementeen Graham, Kathie Rhodes [[id number]]       Graylon Good, PA-C 08/15/13 1540

## 2013-08-15 NOTE — ED Provider Notes (Signed)
Medical screening examination/treatment/procedure(s) were performed by a resident physician or non-physician practitioner and as the supervising physician I was immediately available for consultation/collaboration.  Mccormick Macon, MD    Hodges Treiber S Yena Tisby, MD 08/15/13 1912 

## 2013-08-15 NOTE — ED Notes (Signed)
C/o right leg pain since Sunday of last week. Doesn't know if there was an injury due to having epilepsy

## 2013-09-05 ENCOUNTER — Other Ambulatory Visit: Payer: Self-pay | Admitting: Neurology

## 2013-09-06 ENCOUNTER — Other Ambulatory Visit: Payer: Self-pay | Admitting: Family Medicine

## 2013-09-06 ENCOUNTER — Ambulatory Visit
Admission: RE | Admit: 2013-09-06 | Discharge: 2013-09-06 | Disposition: A | Discharge status: Home / Self Care | Payer: Medicaid Other | Source: Ambulatory Visit | Attending: Family Medicine | Admitting: Family Medicine

## 2013-09-06 DIAGNOSIS — W19XXXA Unspecified fall, initial encounter: Secondary | ICD-10-CM

## 2013-09-06 DIAGNOSIS — M545 Low back pain: Secondary | ICD-10-CM

## 2013-11-29 ENCOUNTER — Telehealth: Payer: Self-pay | Admitting: *Deleted

## 2013-11-29 NOTE — Telephone Encounter (Signed)
Pt is calling re: wanting to speak to C. Daphine DeutscherMartin, NP needing refill on medication.  Has appt on 12-10-13.  I was able to get that he was having trouble getting refill on medication depakote from the pharmacy.    I asked him what he is taking .  Depakote 500mg  2 tabs po bid.  (precscribed 500mg  po bid).  [facility]  [facility] has all three rxs.  They ( spoke to CarneyJessica) have that he is taking depakote 500mg  po bid.  Pts other (vimpat 150mg  po bid amd keppra 1000mg  2 tabs po bid) are the same and he is taking as prescribed.  Please advise.  He will need to come by scat bus for labs (if ordered).  [phone], [phone].

## 2013-11-29 NOTE — Telephone Encounter (Signed)
Pt is only to take Depakote 500mg  one twice daily not 2 twice daily. He has been toxic on the drug at higher doses.He can get labs done the same day of his appt. Do not take Depakote that morning but bring the meds with him

## 2013-11-29 NOTE — Telephone Encounter (Signed)
I called pt.   Moved appt up to 12-06-13 at 1030.  Will have labs drawn at 0900 and then see Eber Jonesarolyn at 1030.  He will bring meds with him to take after blood drawn.  He stated that has enough to get him in.  Bottle of depakote stated 2 tabs po bid.   I told him that dose for depakote is 500mg  po bid. He verbalized understanding.

## 2013-11-30 ENCOUNTER — Other Ambulatory Visit: Payer: Self-pay | Admitting: *Deleted

## 2013-11-30 DIAGNOSIS — R569 Unspecified convulsions: Secondary | ICD-10-CM

## 2013-11-30 NOTE — Progress Notes (Unsigned)
Per Darrol Angelarolyn Martin, NP order for cmp, cbc, vpa (trough) for pt on 12-06-13 when in for his appt on 12-06-13.

## 2013-12-06 ENCOUNTER — Encounter: Payer: Self-pay | Admitting: Nurse Practitioner

## 2013-12-06 ENCOUNTER — Ambulatory Visit (INDEPENDENT_AMBULATORY_CARE_PROVIDER_SITE_OTHER): Payer: Medicaid Other | Admitting: Nurse Practitioner

## 2013-12-06 ENCOUNTER — Other Ambulatory Visit (INDEPENDENT_AMBULATORY_CARE_PROVIDER_SITE_OTHER): Payer: Self-pay

## 2013-12-06 ENCOUNTER — Encounter (INDEPENDENT_AMBULATORY_CARE_PROVIDER_SITE_OTHER): Payer: Self-pay

## 2013-12-06 VITALS — BP 144/72 | HR 52 | Ht 67.0 in | Wt 264.0 lb

## 2013-12-06 DIAGNOSIS — Z0289 Encounter for other administrative examinations: Secondary | ICD-10-CM

## 2013-12-06 DIAGNOSIS — R569 Unspecified convulsions: Secondary | ICD-10-CM

## 2013-12-06 DIAGNOSIS — G40209 Localization-related (focal) (partial) symptomatic epilepsy and epileptic syndromes with complex partial seizures, not intractable, without status epilepticus: Secondary | ICD-10-CM

## 2013-12-06 MED ORDER — LEVETIRACETAM 1000 MG PO TABS: 1000.0000 mg | ORAL_TABLET | Freq: Two times a day (BID) | ORAL | Status: DC

## 2013-12-06 MED ORDER — LACOSAMIDE 150 MG PO TABS: 150.0000 mg | ORAL_TABLET | Freq: Two times a day (BID) | ORAL | Status: DC

## 2013-12-06 NOTE — Patient Instructions (Signed)
Labs done today will call when results are back Depakote 500mg  one twice daily Keppra (Levetracetam) 1000mg  two tabs twice daily Vimpat 150mg  one tab twice daily F/U in 6 months
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
## 2013-12-07 ENCOUNTER — Telehealth: Payer: Self-pay | Admitting: Nurse Practitioner

## 2013-12-07 LAB — COMPREHENSIVE METABOLIC PANEL
A/G RATIO: 1.2 (ref 1.1–2.5)
ALBUMIN: 4.1 g/dL (ref 3.5–5.5)
ALK PHOS: 51 IU/L (ref 39–117)
ALT: 20 IU/L (ref 0–44)
AST: 20 IU/L (ref 0–40)
BUN / CREAT RATIO: 9 (ref 9–20)
BUN: 9 mg/dL (ref 6–24)
CO2: 26 mmol/L (ref 18–29)
CREATININE: 0.98 mg/dL (ref 0.76–1.27)
Calcium: 10.2 mg/dL (ref 8.7–10.2)
Chloride: 103 mmol/L (ref 97–108)
GFR calc Af Amer: 103 mL/min/{1.73_m2} (ref >59)
GFR, EST NON AFRICAN AMERICAN: 90 mL/min/{1.73_m2} (ref >59)
GLOBULIN, TOTAL: 3.5 g/dL (ref 1.5–4.5)
Glucose: 96 mg/dL (ref 65–99)
Potassium: 4.4 mmol/L (ref 3.5–5.2)
SODIUM: 145 mmol/L — AB (ref 134–144)
Total Bilirubin: 0.3 mg/dL (ref 0.0–1.2)
Total Protein: 7.6 g/dL (ref 6.0–8.5)

## 2013-12-07 LAB — CBC WITH DIFFERENTIAL/PLATELET
BASOS ABS: 0 10*3/uL (ref 0.0–0.2)
Basos: 1 %
Eos: 1 %
Eosinophils Absolute: 0 10*3/uL (ref 0.0–0.4)
HEMATOCRIT: 37.3 % — AB (ref 37.5–51.0)
Hemoglobin: 11.5 g/dL — ABNORMAL LOW (ref 12.6–17.7)
IMMATURE GRANULOCYTES: 0 %
Immature Grans (Abs): 0 10*3/uL (ref 0.0–0.1)
Lymphocytes Absolute: 1.9 10*3/uL (ref 0.7–3.1)
Lymphs: 58 %
MCH: 23.1 pg — AB (ref 26.6–33.0)
MCHC: 30.8 g/dL — ABNORMAL LOW (ref 31.5–35.7)
MCV: 75 fL — AB (ref 79–97)
Monocytes Absolute: 0.3 10*3/uL (ref 0.1–0.9)
Monocytes: 9 %
NEUTROS ABS: 1 10*3/uL — AB (ref 1.4–7.0)
NEUTROS PCT: 31 %
RBC: 4.97 x10E6/uL (ref 4.14–5.80)
RDW: 16.5 % — AB (ref 12.3–15.4)
WBC: 3.3 10*3/uL — ABNORMAL LOW (ref 3.4–10.8)

## 2013-12-07 LAB — VALPROIC ACID LEVEL: Valproic Acid Lvl: 104 ug/mL — ABNORMAL HIGH (ref 50–100)

## 2013-12-07 MED ORDER — DIVALPROEX SODIUM ER 500 MG PO TB24: 500.0000 mg | ORAL_TABLET | Freq: Two times a day (BID) | ORAL | Status: DC

## 2013-12-07 NOTE — Telephone Encounter (Signed)
TC to mom. Continue same dose of Depakote 500mg  twice daily. Will renew RX. She verbalizes understanding. Made her aware Vimpat and Keppra called in yesterday

## 2013-12-10 ENCOUNTER — Ambulatory Visit: Payer: Medicaid Other | Admitting: Nurse Practitioner

## 2014-02-23 ENCOUNTER — Encounter: Payer: Self-pay | Admitting: Nurse Practitioner

## 2014-02-23 NOTE — Patient Instructions (Signed)
(  Initial call from patients mother, Levon Hedgerlizabeth Reiland and return call is documented on her chart.)

## 2014-02-24 ENCOUNTER — Telehealth: Payer: Self-pay | Admitting: Nurse Practitioner

## 2014-02-24 NOTE — Telephone Encounter (Signed)
T C to patient's mom Levon HedgerElizabeth Krakowski. She has realized the patient has extra doses of seizure medications in his drawer and  has been taking extra doses. She was made aware that if he is taking too much medication that could be a reason for his seizures as well. She will make sure all the extra medication to secure and she only takes what he is supposed to take she assures me. He had 2 seizures on 5-5 and 1 the previous night. None since. If he has prolonged seizures greater than 5 minutes need to go to the ER. She verbalizes understanding

## 2014-06-18 ENCOUNTER — Other Ambulatory Visit: Payer: Self-pay | Admitting: Nurse Practitioner

## 2014-06-20 ENCOUNTER — Telehealth: Payer: Self-pay | Admitting: Nurse Practitioner

## 2014-06-20 MED ORDER — DIVALPROEX SODIUM ER 500 MG PO TB24: 500.0000 mg | ORAL_TABLET | Freq: Two times a day (BID) | ORAL | Status: DC

## 2014-06-20 NOTE — Telephone Encounter (Signed)
Rx has been sent .  I called back, got no answer.  No voicemail.  Unable to leave message.

## 2014-06-20 NOTE — Telephone Encounter (Signed)
Mother requesting Rx refill for divalproex (DEPAKOTE ER) 500 MG 24 hr tablet.  Please call anytime and may leave detailed message on voice mail.

## 2014-07-24 ENCOUNTER — Other Ambulatory Visit: Payer: Self-pay | Admitting: Neurology

## 2014-10-19 ENCOUNTER — Encounter: Payer: Self-pay | Admitting: Nurse Practitioner

## 2014-10-19 ENCOUNTER — Ambulatory Visit (INDEPENDENT_AMBULATORY_CARE_PROVIDER_SITE_OTHER): Payer: Medicaid Other | Admitting: Nurse Practitioner

## 2014-10-19 VITALS — BP 140/86 | HR 63 | Temp 97.8°F | Resp 18 | Ht 72.0 in | Wt 257.0 lb

## 2014-10-19 DIAGNOSIS — Z5181 Encounter for therapeutic drug level monitoring: Secondary | ICD-10-CM

## 2014-10-19 DIAGNOSIS — G40209 Localization-related (focal) (partial) symptomatic epilepsy and epileptic syndromes with complex partial seizures, not intractable, without status epilepticus: Secondary | ICD-10-CM

## 2014-10-19 NOTE — Patient Instructions (Signed)
CBC, CMP VPA level and Keppra level today Continue meds at current doses for now until labs back F/U 6 months

## 2014-10-19 NOTE — Progress Notes (Signed)
I agree above plan. 
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
## 2014-10-20 LAB — CBC WITH DIFFERENTIAL/PLATELET
BASOS ABS: 0 10*3/uL (ref 0.0–0.2)
Basos: 1 %
Eos: 2 %
Eosinophils Absolute: 0.1 10*3/uL (ref 0.0–0.4)
HCT: 34.8 % — ABNORMAL LOW (ref 37.5–51.0)
Hemoglobin: 11 g/dL — ABNORMAL LOW (ref 12.6–17.7)
IMMATURE GRANS (ABS): 0 10*3/uL (ref 0.0–0.1)
IMMATURE GRANULOCYTES: 0 %
LYMPHS: 51 %
Lymphocytes Absolute: 1.6 10*3/uL (ref 0.7–3.1)
MCH: 22.5 pg — ABNORMAL LOW (ref 26.6–33.0)
MCHC: 31.6 g/dL (ref 31.5–35.7)
MCV: 71 fL — ABNORMAL LOW (ref 79–97)
MONOCYTES: 11 %
Monocytes Absolute: 0.3 10*3/uL (ref 0.1–0.9)
NEUTROS PCT: 35 %
Neutrophils Absolute: 1 10*3/uL — ABNORMAL LOW (ref 1.4–7.0)
RBC: 4.89 x10E6/uL (ref 4.14–5.80)
RDW: 17.4 % — AB (ref 12.3–15.4)
WBC: 3 10*3/uL — AB (ref 3.4–10.8)

## 2014-10-23 LAB — COMPREHENSIVE METABOLIC PANEL
ALBUMIN: 4.3 g/dL (ref 3.5–5.5)
ALT: 30 IU/L (ref 0–44)
AST: 27 IU/L (ref 0–40)
Albumin/Globulin Ratio: 1.3 (ref 1.1–2.5)
Alkaline Phosphatase: 54 IU/L (ref 39–117)
BUN/Creatinine Ratio: 13 (ref 9–20)
BUN: 11 mg/dL (ref 6–24)
CALCIUM: 9.6 mg/dL (ref 8.7–10.2)
CO2: 20 mmol/L (ref 18–29)
CREATININE: 0.87 mg/dL (ref 0.76–1.27)
Chloride: 102 mmol/L (ref 97–108)
GFR calc Af Amer: 116 mL/min/{1.73_m2} (ref >59)
GFR calc non Af Amer: 100 mL/min/{1.73_m2} (ref >59)
GLOBULIN, TOTAL: 3.3 g/dL (ref 1.5–4.5)
Glucose: 104 mg/dL — ABNORMAL HIGH (ref 65–99)
Potassium: 4.3 mmol/L (ref 3.5–5.2)
Sodium: 138 mmol/L (ref 134–144)
TOTAL PROTEIN: 7.6 g/dL (ref 6.0–8.5)
Total Bilirubin: 0.4 mg/dL (ref 0.0–1.2)

## 2014-10-23 LAB — LEVETIRACETAM LEVEL: Levetiracetam Lvl: 14.7 ug/mL (ref 10.0–40.0)

## 2014-10-23 LAB — VALPROIC ACID LEVEL: Valproic Acid Lvl: 84 ug/mL (ref 50–100)

## 2014-10-28 NOTE — Progress Notes (Signed)
I agree above plan. 

## 2014-12-15 ENCOUNTER — Emergency Department (HOSPITAL_COMMUNITY)
Admission: EM | Admit: 2014-12-15 | Discharge: 2014-12-15 | Disposition: A | Discharge status: Home / Self Care | Payer: Medicaid Other | Source: Home / Self Care | Attending: Emergency Medicine | Admitting: Emergency Medicine

## 2014-12-15 ENCOUNTER — Emergency Department (HOSPITAL_COMMUNITY)
Admission: EM | Admit: 2014-12-15 | Discharge: 2014-12-15 | Disposition: A | Discharge status: Home / Self Care | Payer: Medicaid Other | Attending: Emergency Medicine | Admitting: Emergency Medicine

## 2014-12-15 ENCOUNTER — Encounter (HOSPITAL_COMMUNITY): Payer: Self-pay | Admitting: *Deleted

## 2014-12-15 DIAGNOSIS — R569 Unspecified convulsions: Secondary | ICD-10-CM

## 2014-12-15 DIAGNOSIS — G40909 Epilepsy, unspecified, not intractable, without status epilepticus: Secondary | ICD-10-CM | POA: Insufficient documentation

## 2014-12-15 DIAGNOSIS — Z87891 Personal history of nicotine dependence: Secondary | ICD-10-CM | POA: Insufficient documentation

## 2014-12-15 DIAGNOSIS — Z79899 Other long term (current) drug therapy: Secondary | ICD-10-CM

## 2014-12-15 LAB — CBC
HCT: 36.5 % — ABNORMAL LOW (ref 39.0–52.0)
HEMOGLOBIN: 11.7 g/dL — AB (ref 13.0–17.0)
MCH: 23.4 pg — ABNORMAL LOW (ref 26.0–34.0)
MCHC: 32.1 g/dL (ref 30.0–36.0)
MCV: 72.9 fL — ABNORMAL LOW (ref 78.0–100.0)
PLATELETS: ADEQUATE 10*3/uL (ref 150–400)
RBC: 5.01 MIL/uL (ref 4.22–5.81)
RDW: 16.1 % — ABNORMAL HIGH (ref 11.5–15.5)
WBC: 2.7 10*3/uL — ABNORMAL LOW (ref 4.0–10.5)

## 2014-12-15 LAB — I-STAT CHEM 8, ED
BUN: 14 mg/dL (ref 6–23)
CHLORIDE: 107 mmol/L (ref 96–112)
Calcium, Ion: 1.23 mmol/L (ref 1.12–1.23)
Creatinine, Ser: 0.8 mg/dL (ref 0.50–1.35)
Glucose, Bld: 99 mg/dL (ref 70–99)
HEMATOCRIT: 39 % (ref 39.0–52.0)
Hemoglobin: 13.3 g/dL (ref 13.0–17.0)
POTASSIUM: 4.5 mmol/L (ref 3.5–5.1)
SODIUM: 141 mmol/L (ref 135–145)
TCO2: 22 mmol/L (ref 0–100)

## 2014-12-15 LAB — BASIC METABOLIC PANEL
ANION GAP: 6 (ref 5–15)
BUN: 12 mg/dL (ref 6–23)
CHLORIDE: 109 mmol/L (ref 96–112)
CO2: 22 mmol/L (ref 19–32)
Calcium: 9.5 mg/dL (ref 8.4–10.5)
Creatinine, Ser: 1.02 mg/dL (ref 0.50–1.35)
GFR calc Af Amer: >90 mL/min (ref >90)
GFR calc non Af Amer: 83 mL/min — ABNORMAL LOW (ref >90)
GLUCOSE: 104 mg/dL — AB (ref 70–99)
POTASSIUM: 4.2 mmol/L (ref 3.5–5.1)
Sodium: 137 mmol/L (ref 135–145)

## 2014-12-15 LAB — CBG MONITORING, ED
GLUCOSE-CAPILLARY: 85 mg/dL (ref 70–99)
GLUCOSE-CAPILLARY: 85 mg/dL (ref 70–99)

## 2014-12-15 LAB — VALPROIC ACID LEVEL: Valproic Acid Lvl: 55.9 ug/mL (ref 50.0–100.0)

## 2014-12-15 MED ORDER — LEVETIRACETAM IN NACL 1000 MG/100ML IV SOLN
1000.0000 mg | Freq: Once | INTRAVENOUS | Status: AC
  Administered 2014-12-15: 1000 mg via INTRAVENOUS
  Filled 2014-12-15 (×2): qty 100

## 2014-12-15 NOTE — ED Notes (Signed)
CBG Taken = 85

## 2014-12-15 NOTE — ED Notes (Signed)
Pt was just d/c by this RN earlier today.

## 2014-12-15 NOTE — ED Notes (Signed)
DISCHARGE VITALS COMPLETE. 
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
## 2014-12-15 NOTE — Discharge Instructions (Signed)

## 2014-12-15 NOTE — ED Provider Notes (Signed)
CSN: [id number]     Arrival date & time 12/15/14  1152 History   First MD Initiated Contact with Patient 12/15/14 1156     Chief Complaint  Patient presents with  . Seizures     (Consider location/radiation/quality/duration/timing/severity/associated sxs/prior Treatment) HPI.... Level V caveat for uncertain history. According to nursing notes, [facility] Patient was found on the scene to be "pale, cool, diaphoretic". Soon after, patient was noted to be "running around the store". On arrival to the emergency department, he was alert and oriented 3 without neurological deficits. He stated that he felt fine. No evidence of postictal behavior. No prodromal illnesses. No specific complaints at this time.  Past Medical History  Diagnosis Date  . Epilepsy seizure, nonconvulsive, generalized    Past Surgical History  Procedure Laterality Date  . None listed     History reviewed. No pertinent family history. History  Substance Use Topics  . Smoking status: Former Games developermoker  . Smokeless tobacco: Never Used  . Alcohol Use: No     Comment: patient quit in 1993     Review of Systems  Unable to perform ROS: Acuity of condition      Allergies  Review of patient's allergies indicates no known allergies.  Home Medications   Prior to Admission medications   Medication Sig Start Date End Date Taking? Authorizing Provider  divalproex (DEPAKOTE ER) 500 MG 24 hr tablet TAKE ONE TABLET BY MOUTH TWICE DAILY 07/24/14  Yes Levert FeinsteinYijun Yan, MD  levETIRAcetam (KEPPRA) 1000 MG tablet Take 1 tablet (1,000 mg total) by mouth 2 (two) times daily. 12/06/13  Yes Nilda RiggsNancy Carolyn Martin, NP  VIMPAT 150 MG TABS TAKE ONE TABLET BY MOUTH TWICE DAILY 06/20/14  Yes Levert FeinsteinYijun Yan, MD  meloxicam (MOBIC) 15 MG tablet Take 1 tablet (15 mg total) by mouth daily. Patient not taking: Reported on 12/15/2014 08/15/13   Graylon GoodZachary H Baker, PA-C   BP 121/72 mmHg  Pulse 58  Temp(Src) 98.2 F (36.8 C)  (Oral)  Resp 13  SpO2 100% Physical Exam  Constitutional: He is oriented to person, place, and time. He appears well-developed and well-nourished.  HENT:  Head: Normocephalic and atraumatic.  Eyes: Conjunctivae and EOM are normal. Pupils are equal, round, and reactive to light.  Neck: Normal range of motion. Neck supple.  Cardiovascular: Normal rate and regular rhythm.   Pulmonary/Chest: Effort normal and breath sounds normal.  Abdominal: Soft. Bowel sounds are normal.  Musculoskeletal: Normal range of motion.  Neurological: He is alert and oriented to person, place, and time.  Skin: Skin is warm and dry.  Psychiatric: He has a normal mood and affect. His behavior is normal.  Nursing note and vitals reviewed.   ED Course  Procedures (including critical care time) Labs Review Labs Reviewed  BASIC METABOLIC PANEL - Abnormal; Notable for the following:    Glucose, Bld 104 (*)    GFR calc non Af Amer 83 (*)    All other components within normal limits  CBC - Abnormal; Notable for the following:    WBC 2.7 (*)    Hemoglobin 11.7 (*)    HCT 36.5 (*)    MCV 72.9 (*)    MCH 23.4 (*)    RDW 16.1 (*)    All other components within normal limits  VALPROIC ACID LEVEL  CBG MONITORING, ED    Imaging Review No results found.   EKG Interpretation   Date/Time:  Thursday December 15 2014 12:02:08 EST  Ventricular Rate:  94 PR Interval:  147 QRS Duration: 86 QT Interval:  358 QTC Calculation: 448 R Axis:   47 Text Interpretation:  Age not entered, assumed to be  54 years old for  purpose of ECG interpretation Sinus rhythm Ventricular bigeminy Left  atrial enlargement ST depr, consider ischemia, inferior leads Borderline  ST elevation, anterolateral leads Confirmed by Adriana Simas  MD, Fletcher Rathbun ([id number]) on  12/15/2014 12:37:30 PM      MDM   Final diagnoses:  Seizure disorder   Patient is alert and oriented 3 without neurological deficits. Screening labs including Depakote level were  normal. Patient was observed for greater than 2 hours with no change in his behavior. He was discharged home.    Donnetta Hutching, MD 12/17/14 2038

## 2014-12-15 NOTE — ED Notes (Signed)
CBG 85

## 2014-12-15 NOTE — ED Notes (Signed)
[facility] pt was witnessed to have 1 min seizure at store by employee, pt post ictal upon EMS arrival, pt reported to fall against counter & lowered himself to the floor, pt hx of seizures, pt takes Depakote, pt A&O x4 upon arrival to ED

## 2014-12-15 NOTE — ED Provider Notes (Signed)
CSN: [id number]     Arrival date & time 12/15/14  1756 History   First MD Initiated Contact with Patient 12/15/14 1758     Chief Complaint  Patient presents with  . Seizures     (Consider location/radiation/quality/duration/timing/severity/associated sxs/prior Treatment) HPI Comments:  Patient presents emergency department with chief complaint of seizure. He states that he had a seizure earlier today and was already seen here. He was discharged.  States that he may have had a another seizure.  He states that he is feeling fine.  States that he has been compliant with taking his antiepileptic medications.  He takes keppra, depakote.  Denies any fevers, chills, cough, nausea, vomiting, diarrhea, chest pain, or abdominal pain.  There are no aggravating or alleviating factors.   The history is provided by the patient. No language interpreter was used.    Past Medical History  Diagnosis Date  . Epilepsy seizure, nonconvulsive, generalized    Past Surgical History  Procedure Laterality Date  . None listed     History reviewed. No pertinent family history. History  Substance Use Topics  . Smoking status: Former Games developer  . Smokeless tobacco: Never Used  . Alcohol Use: No     Comment: patient quit in 1993     Review of Systems  Constitutional: Negative for fever and chills.  Respiratory: Negative for shortness of breath.   Cardiovascular: Negative for chest pain.  Gastrointestinal: Negative for nausea, vomiting, diarrhea and constipation.  Genitourinary: Negative for dysuria.  Neurological: Positive for seizures.  All other systems reviewed and are negative.     Allergies  Review of patient's allergies indicates no known allergies.  Home Medications   Prior to Admission medications   Medication Sig Start Date End Date Taking? Authorizing Provider  divalproex (DEPAKOTE ER) 500 MG 24 hr tablet TAKE ONE TABLET BY MOUTH TWICE DAILY 07/24/14   Levert Feinstein, MD  levETIRAcetam (KEPPRA)  1000 MG tablet Take 1 tablet (1,000 mg total) by mouth 2 (two) times daily. 12/06/13   Nilda Riggs, NP  meloxicam (MOBIC) 15 MG tablet Take 1 tablet (15 mg total) by mouth daily. Patient not taking: Reported on 12/15/2014 08/15/13   Graylon Good, PA-C  VIMPAT 150 MG TABS TAKE ONE TABLET BY MOUTH TWICE DAILY 06/20/14   Levert Feinstein, MD   BP 134/74 mmHg  Pulse 64  Resp 14  SpO2 100% Physical Exam  Constitutional: He is oriented to person, place, and time. He appears well-developed and well-nourished.  HENT:  Head: Normocephalic and atraumatic.  Eyes: Conjunctivae and EOM are normal. Pupils are equal, round, and reactive to light. Right eye exhibits no discharge. Left eye exhibits no discharge. No scleral icterus.  Neck: Normal range of motion. Neck supple. No JVD present.  Cardiovascular: Normal rate, regular rhythm and normal heart sounds.  Exam reveals no gallop and no friction rub.   No murmur heard. Pulmonary/Chest: Effort normal and breath sounds normal. No respiratory distress. He has no wheezes. He has no rales. He exhibits no tenderness.  Abdominal: Soft. He exhibits no distension and no mass. There is no tenderness. There is no rebound and no guarding.  Musculoskeletal: Normal range of motion. He exhibits no edema or tenderness.  Neurological: He is alert and oriented to person, place, and time.  Skin: Skin is warm and dry.  Psychiatric: He has a normal mood and affect. His behavior is normal. Judgment and thought content normal.  Nursing note and vitals reviewed.   ED  Course  Procedures (including critical care time) Labs Review Labs Reviewed  I-STAT CHEM 8, ED  CBG MONITORING, ED    Imaging Review No results found.   EKG Interpretation   Date/Time:  Thursday December 15 2014 18:21:33 EST Ventricular Rate:  57 PR Interval:  170 QRS Duration: 95 QT Interval:  412 QTC Calculation: 401 R Axis:   61 Text Interpretation:  Sinus rhythm Atrial premature complexes  Left atrial  enlargement Minimal ST depression, inferior leads Borderline ST elevation,  anterior leads Similar to prior from this morning Confirmed by Gwendolyn GrantWALDEN  MD,  BLAIR ([id number]) on 12/15/2014 6:29:33 PM      MDM   Final diagnoses:  Seizure       Patient seen earlier today with seizures. Labs were reassuring. Patient was discharged, and then reportedly had another seizure.  EKG and electrolytes are unchanged. Patient is well-appearing. States he feels well. I will give him a loading dose of Keppra, discharge to home. Patient states that he wants to go home, but will taken dose of Keppra here. Encouraged him to continue taking his medications. Encouraged him to return if he has an additional seizure  , as he may need to be admitted. Patient understands and agrees with the plan.  Patient discussed with Dr. Gwendolyn GrantWalden who agrees with the plan.   Roxy Horsemanobert Kyah Buesing, PA-C 12/15/14 2028  Elwin MochaBlair Walden, MD 12/15/14 [phone]

## 2014-12-15 NOTE — ED Notes (Signed)
Pt is stable upon d/c and ambulates from ED escorted by staff. 

## 2014-12-15 NOTE — Discharge Instructions (Signed)
Rest. Increase fluids. Eat regular diet. Take your normal medication. Follow-up with your primary care doctor.

## 2014-12-21 ENCOUNTER — Telehealth: Payer: Self-pay | Admitting: Nurse Practitioner
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
## 2014-12-21 NOTE — Telephone Encounter (Addendum)
Spoke to mother. Advised to contact/establish PCP in reference to requesting a letter about patient needing adult supervision to be placed in a home b/c patient is seen by our office for seizures. Explained any portion part of the SCAT form that needed to be filled out by our office to receive transportation to appts ok to drop off. Patient agreed.

## 2014-12-28 ENCOUNTER — Encounter: Payer: Self-pay | Admitting: Nurse Practitioner

## 2014-12-28 ENCOUNTER — Ambulatory Visit (INDEPENDENT_AMBULATORY_CARE_PROVIDER_SITE_OTHER): Payer: Medicaid Other | Admitting: Nurse Practitioner

## 2014-12-28 VITALS — BP 122/66 | HR 69 | Ht 72.0 in | Wt 249.8 lb

## 2014-12-28 DIAGNOSIS — G40209 Localization-related (focal) (partial) symptomatic epilepsy and epileptic syndromes with complex partial seizures, not intractable, without status epilepticus: Secondary | ICD-10-CM

## 2014-12-28 DIAGNOSIS — R5601 Complex febrile convulsions: Secondary | ICD-10-CM | POA: Diagnosis not present

## 2014-12-28 MED ORDER — LEVETIRACETAM 1000 MG PO TABS: 1000.0000 mg | ORAL_TABLET | Freq: Two times a day (BID) | ORAL | Status: DC

## 2014-12-28 MED ORDER — LACOSAMIDE 150 MG PO TABS: 1.0000 | ORAL_TABLET | Freq: Two times a day (BID) | ORAL | Status: DC

## 2014-12-28 MED ORDER — DIVALPROEX SODIUM ER 500 MG PO TB24: 500.0000 mg | ORAL_TABLET | Freq: Two times a day (BID) | ORAL | Status: DC

## 2014-12-28 NOTE — Patient Instructions (Addendum)
Restart Vimpat starter pack given Continue Keppra  At 1000mg  BID Continue Depakote 500mg  BID will refill Given Rx for Vimpat to take after starter 150mg  BID Reviewed labs from ER visit 11/2514 Depakote therapeutic at that time Follow-up in 6 months
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
## 2014-12-29 NOTE — Progress Notes (Signed)
I have reviewed and agreed above plan. 

## 2015-01-01 ENCOUNTER — Other Ambulatory Visit: Payer: Self-pay | Admitting: Neurology

## 2015-01-04 ENCOUNTER — Telehealth: Payer: Self-pay | Admitting: *Deleted

## 2015-01-04 NOTE — Telephone Encounter (Signed)
Form,[facility] and Eber JonesCarolyn 01-04-15.

## 2015-01-10 NOTE — Telephone Encounter (Signed)
Form was given to Donna S., in medical records. 

## 2015-01-11 ENCOUNTER — Telehealth: Payer: Self-pay | Admitting: *Deleted

## 2015-01-11 NOTE — Telephone Encounter (Signed)
Form,[employer] received,completed by Eber Jonesarolyn and Cassandra at front desk for patient 01-11-15.

## 2015-01-15 ENCOUNTER — Other Ambulatory Visit: Payer: Self-pay | Admitting: Neurology

## 2015-01-15 ENCOUNTER — Other Ambulatory Visit: Payer: Self-pay | Admitting: Nurse Practitioner

## 2015-01-28 ENCOUNTER — Emergency Department (HOSPITAL_COMMUNITY)
Admission: EM | Admit: 2015-01-28 | Discharge: 2015-01-28 | Disposition: A | Discharge status: Home / Self Care | Payer: Medicaid Other | Attending: Emergency Medicine | Admitting: Emergency Medicine

## 2015-01-28 ENCOUNTER — Encounter (HOSPITAL_COMMUNITY): Payer: Self-pay | Admitting: Emergency Medicine

## 2015-01-28 DIAGNOSIS — Z87891 Personal history of nicotine dependence: Secondary | ICD-10-CM | POA: Diagnosis not present

## 2015-01-28 DIAGNOSIS — Z79899 Other long term (current) drug therapy: Secondary | ICD-10-CM | POA: Diagnosis not present

## 2015-01-28 DIAGNOSIS — R569 Unspecified convulsions: Secondary | ICD-10-CM

## 2015-01-28 DIAGNOSIS — G40909 Epilepsy, unspecified, not intractable, without status epilepticus: Secondary | ICD-10-CM | POA: Diagnosis not present

## 2015-01-28 LAB — VALPROIC ACID LEVEL: VALPROIC ACID LVL: 41.9 ug/mL — AB (ref 50.0–100.0)
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
## 2015-01-28 NOTE — Discharge Instructions (Signed)
Your depakote level is low (42) - take your seizure medications as prescribed. Do not skip or miss doses.  Follow up with your doctor/neurologist in the coming week.  No driving, swimming, or operating heavy and/or dangerous machinery until cleared to do so by your doctor.  Return to ER if worse, new symptoms, fevers, recurrent or persistent seizures, medical emergency, other concern.       Epilepsy Epilepsy is a disorder in which a person has repeated seizures over time. A seizure is a release of abnormal electrical activity in the brain. Seizures can cause a change in attention, behavior, or the ability to remain awake and alert (altered mental status). Seizures often involve uncontrollable shaking (convulsions).  Most people with epilepsy lead normal lives. However, people with epilepsy are at an increased risk of falls, accidents, and injuries. Therefore, it is important to begin treatment right away. CAUSES  Epilepsy has many possible causes. Anything that disturbs the normal pattern of brain cell activity can lead to seizures. This may include:   Head injury.  Birth trauma.  High fever as a child.  Stroke.  Bleeding into or around the brain.  Certain drugs.  Prolonged low oxygen, such as what occurs after CPR efforts.  Abnormal brain development.  Certain illnesses, such as meningitis, encephalitis (brain infection), malaria, and other infections.  An imbalance of nerve signaling chemicals (neurotransmitters).  SIGNS AND SYMPTOMS  The symptoms of a seizure can vary greatly from one person to another. Right before a seizure, you may have a warning (aura) that a seizure is about to occur. An aura may include the following symptoms:  Fear or anxiety.  Nausea.  Feeling like the room is spinning (vertigo).  Vision changes, such as seeing flashing lights or spots. Common symptoms during a seizure include:  Abnormal sensations, such as an abnormal smell or a bitter  taste in the mouth.   Sudden, general body stiffness.   Convulsions that involve rhythmic jerking of the face, arm, or leg on one or both sides.   Sudden change in consciousness.   Appearing to be awake but not responding.   Appearing to be asleep but cannot be awakened.   Grimacing, chewing, lip smacking, drooling, tongue biting, or loss of bowel or bladder control. After a seizure, you may feel sleepy for a while. DIAGNOSIS  Your health care provider will ask about your symptoms and take a medical history. Descriptions from any witnesses to your seizures will be very helpful in the diagnosis. A physical exam, including a detailed neurological exam, is necessary. Various tests may be done, such as:   An electroencephalogram (EEG). This is a painless test of your brain waves. In this test, a diagram is created of your brain waves. These diagrams can be interpreted by a specialist.  An MRI of the brain.   A CT scan of the brain.   A spinal tap (lumbar puncture, LP).  Blood tests to check for signs of infection or abnormal blood chemistry. TREATMENT  There is no cure for epilepsy, but it is generally treatable. Once epilepsy is diagnosed, it is important to begin treatment as soon as possible. For most people with epilepsy, seizures can be controlled with medicines. The following may also be used:  A pacemaker for the brain (vagus nerve stimulator) can be used for people with seizures that are not well controlled by medicine.  Surgery on the brain. For some people, epilepsy eventually goes away. HOME CARE INSTRUCTIONS   Follow  your health care provider's recommendations on driving and safety in normal activities.  Get enough rest. Lack of sleep can cause seizures.  Only take over-the-counter or prescription medicines as directed by your health care provider. Take any prescribed medicine exactly as directed.  Avoid any known triggers of your seizures.  Keep a seizure  diary. Record what you recall about any seizure, especially any possible trigger.   Make sure the people you live and work with know that you are prone to seizures. They should receive instructions on how to help you. In general, a witness to a seizure should:   Cushion your head and body.   Turn you on your side.   Avoid unnecessarily restraining you.   Not place anything inside your mouth.   Call for emergency medical help if there is any question about what has occurred.   Follow up with your health care provider as directed. You may need regular blood tests to monitor the levels of your medicine.  SEEK MEDICAL CARE IF:   You develop signs of infection or other illness. This might increase the risk of a seizure.   You seem to be having more frequent seizures.   Your seizure pattern is changing.  SEEK IMMEDIATE MEDICAL CARE IF:   You have a seizure that does not stop after a few moments.   You have a seizure that causes any difficulty in breathing.   You have a seizure that results in a very severe headache.   You have a seizure that leaves you with the inability to speak or use a part of your body.  Document Released: 10/07/2005 Document Revised: 07/28/2013 Document Reviewed: 05/19/2013 [facility] Patient Information 2015 [employer], [place]. This information is not intended to replace advice given to you by your health care provider. Make sure you discuss any questions you have with your health care provider.

## 2015-01-28 NOTE — ED Notes (Signed)
Per mother/Elizabeth Margo AyeHall states to discharge pt with bus pass.

## 2015-01-28 NOTE — ED Provider Notes (Signed)
Signed out by Dr Loretha StaplerWofford, to d/c to home when depakote level returns, that d/c instructions are complete.  Pt remains alert, content, while in ED. Tolerating po fluids. No seizure activity. No c/o pain or other new symptoms.     Seth LaineKevin Samie Barclift, MD 01/28/15 [phone]

## 2015-01-28 NOTE — ED Notes (Signed)
Pt found outside furniture [address] Witness observed pt twitching in parking lot. When EMS arrived pt standing next to shopping cart. Pt hx of seizures; takes Keppa and Dilantin.

## 2015-05-19 ENCOUNTER — Other Ambulatory Visit: Payer: Self-pay | Admitting: Nurse Practitioner

## 2015-07-03 ENCOUNTER — Ambulatory Visit: Payer: Medicaid Other | Admitting: Nurse Practitioner

## 2015-07-05 ENCOUNTER — Ambulatory Visit (INDEPENDENT_AMBULATORY_CARE_PROVIDER_SITE_OTHER): Payer: Medicaid Other | Admitting: Nurse Practitioner

## 2015-07-05 ENCOUNTER — Encounter: Payer: Self-pay | Admitting: Nurse Practitioner

## 2015-07-05 VITALS — BP 153/86 | HR 75 | Ht 72.0 in | Wt 259.0 lb

## 2015-07-05 DIAGNOSIS — G40209 Localization-related (focal) (partial) symptomatic epilepsy and epileptic syndromes with complex partial seizures, not intractable, without status epilepticus: Secondary | ICD-10-CM | POA: Diagnosis not present

## 2015-07-05 DIAGNOSIS — Z5181 Encounter for therapeutic drug level monitoring: Secondary | ICD-10-CM

## 2015-07-05 NOTE — Progress Notes (Signed)
I have reviewed and agreed above plan. 

## 2015-07-05 NOTE — Patient Instructions (Signed)
Will check Depakote level today Continue Depakote at current dose for now Continue Vimpat at current dose Continue Keppra at current dose Follow-up 6-8 months
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
## 2015-07-06 LAB — VALPROIC ACID LEVEL: Valproic Acid Lvl: 90 ug/mL (ref 50–100)

## 2015-07-08 ENCOUNTER — Encounter (HOSPITAL_COMMUNITY): Payer: Self-pay | Admitting: Emergency Medicine

## 2015-07-08 ENCOUNTER — Emergency Department (HOSPITAL_COMMUNITY)
Admission: EM | Admit: 2015-07-08 | Discharge: 2015-07-08 | Disposition: A | Discharge status: Home / Self Care | Payer: Medicaid Other | Attending: Emergency Medicine | Admitting: Emergency Medicine

## 2015-07-08 DIAGNOSIS — Z87891 Personal history of nicotine dependence: Secondary | ICD-10-CM | POA: Insufficient documentation

## 2015-07-08 DIAGNOSIS — Z79899 Other long term (current) drug therapy: Secondary | ICD-10-CM | POA: Diagnosis not present

## 2015-07-08 DIAGNOSIS — G40209 Localization-related (focal) (partial) symptomatic epilepsy and epileptic syndromes with complex partial seizures, not intractable, without status epilepticus: Secondary | ICD-10-CM | POA: Insufficient documentation

## 2015-07-08 DIAGNOSIS — R569 Unspecified convulsions: Secondary | ICD-10-CM | POA: Diagnosis present

## 2015-07-08 LAB — I-STAT CHEM 8, ED
BUN: 11 mg/dL (ref 6–20)
CALCIUM ION: 1.23 mmol/L (ref 1.12–1.23)
Chloride: 105 mmol/L (ref 101–111)
Creatinine, Ser: 0.8 mg/dL (ref 0.61–1.24)
Glucose, Bld: 144 mg/dL — ABNORMAL HIGH (ref 65–99)
HCT: 43 % (ref 39.0–52.0)
HEMOGLOBIN: 14.6 g/dL (ref 13.0–17.0)
Potassium: 4.3 mmol/L (ref 3.5–5.1)
SODIUM: 139 mmol/L (ref 135–145)
TCO2: 25 mmol/L (ref 0–100)

## 2015-07-08 MED ORDER — LACOSAMIDE 50 MG PO TABS
150.0000 mg | ORAL_TABLET | Freq: Two times a day (BID) | ORAL | Status: DC
  Administered 2015-07-08: 150 mg via ORAL
  Filled 2015-07-08: qty 3

## 2015-07-08 MED ORDER — DIVALPROEX SODIUM ER 500 MG PO TB24
500.0000 mg | ORAL_TABLET | Freq: Two times a day (BID) | ORAL | Status: DC
  Administered 2015-07-08: 500 mg via ORAL
  Filled 2015-07-08 (×2): qty 1

## 2015-07-08 MED ORDER — LACOSAMIDE 150 MG PO TABS
1.0000 | ORAL_TABLET | Freq: Two times a day (BID) | ORAL | Status: DC
  Filled 2015-07-08: qty 1

## 2015-07-08 MED ORDER — LEVETIRACETAM 500 MG PO TABS
1000.0000 mg | ORAL_TABLET | Freq: Two times a day (BID) | ORAL | Status: DC
  Administered 2015-07-08: 1000 mg via ORAL
  Filled 2015-07-08 (×2): qty 2

## 2015-07-08 NOTE — ED Notes (Signed)
Bed: WA21 Expected date:  Expected time:  Means of arrival:  Comments: Ems  

## 2015-07-08 NOTE — ED Notes (Signed)
Per EMS, patient was on bus and had a focal seizure.  Seizure lasted about 2 minutes.  Afterwards, patient was alert but confused.  Denies any trauma or head injury.  Patient has a history of eliplsey and does take meds for them.  Patient is alert and orientated.    NSR on monitor  BP:  176/78 HR : 74 R: 20 96% room air CBG: 261

## 2015-07-08 NOTE — ED Provider Notes (Signed)
CSN: [id number]     Arrival date & time 07/08/15  1228 History   First MD Initiated Contact with Patient 07/08/15 1235     Chief Complaint  Patient presents with  . Seizures     (Consider location/radiation/quality/duration/timing/severity/associated sxs/prior Treatment) HPI   PCP: No PCP Per Patient PMH: Epilepsy  Seth Brock is a 54 y.o.  male  CHIEF COMPLAINT: Seizure brought in by EMS  Time of onset: Just prior to arrival        Quality:  Focal seizure lasting 2 minutes        Progression:  Resolved, currently no post ictal state, alert and oriented Chronicity: Acute, reoccurring Risk factors: Epilepsy Treatments tried:  None Relieved by: Medications Worsened by: Unknown Associated Symptoms:  Patient denies having any symptoms at this time or any pain complaints.  The patient takes Depakote, Keppra and Vimpat for his seizures he reports being compliant with his Keppra and Depakote but has been out of his Vimpat for 1 months, he got a refill plans to pick it up to initiate tomorrow.   ROS: The patient denies diaphoresis, fever, headache, weakness (general or focal), confusion, change of vision,  dysphagia, aphagia, shortness of breath,  abdominal pains, nausea, vomiting, diarrhea, lower extremity swelling, rash, neck pain, chest pain    Past Medical History  Diagnosis Date  . Epilepsy seizure, nonconvulsive, generalized    Past Surgical History  Procedure Laterality Date  . None listed     No family history on file. Social History  Substance Use Topics  . Smoking status: Former Games developer  . Smokeless tobacco: Never Used  . Alcohol Use: No     Comment: patient quit in 1993     Review of Systems  10 Systems reviewed and are negative for acute change except as noted in the HPI.     Allergies  Review of patient's allergies indicates no known allergies.  Home Medications   Prior to Admission medications   Medication Sig Start Date End Date Taking? Authorizing  Provider  divalproex (DEPAKOTE ER) 500 MG 24 hr tablet Take 1 tablet (500 mg total) by mouth 2 (two) times daily. 12/28/14  Yes Nilda Riggs, NP  levETIRAcetam (KEPPRA) 1000 MG tablet Take 1 tablet (1,000 mg total) by mouth 2 (two) times daily. 12/28/14  Yes Nilda Riggs, NP  VIMPAT 150 MG TABS TAKE ONE TABLET BY MOUTH TWICE DAILY 07/11/15   Levert Feinstein, MD   BP 152/72 mmHg  Pulse 63  Temp(Src) 98.2 F (36.8 C) (Oral)  Resp 18  Ht 6' (1.829 m)  Wt 270 lb (122.471 kg)  BMI 36.61 kg/m2  SpO2 97% Physical Exam  Constitutional: He is oriented to person, place, and time. He appears well-developed and well-nourished. No distress.  HENT:  Head: Normocephalic and atraumatic.  No signs of head, neck or oral injury  Eyes: Pupils are equal, round, and reactive to light.  Neck: Normal range of motion. Neck supple. No spinous process tenderness and no muscular tenderness present.  Cardiovascular: Normal rate and regular rhythm.   Pulmonary/Chest: Effort normal.  Abdominal: Soft.  Neurological: He is alert and oriented to person, place, and time.  Not currently post ictal or displaying any seizure-like activity.   Skin: Skin is warm and dry.  No bruises or contusions  Nursing note and vitals reviewed.   ED Course  Procedures (including critical care time) Labs Review Labs Reviewed  I-STAT CHEM 8, ED - Abnormal; Notable for the following:  Glucose, Bld 144 (*)    All other components within normal limits    Imaging Review No results found. I have personally reviewed and evaluated these images and lab results as part of my medical decision-making.   EKG Interpretation   Date/Time:  Saturday July 08 2015 16:47:54 EDT Ventricular Rate:  64 PR Interval:  156 QRS Duration: 79 QT Interval:  401 QTC Calculation: 414 R Axis:   55 Text Interpretation:  Sinus rhythm Left ventricular hypertrophy No  significant change since last tracing Confirmed by POLLINA  MD,   CHRISTOPHER [phone]) on 07/08/2015 4:52:04 PM      MDM   Final diagnoses:  Partial symptomatic epilepsy with complex partial seizures, not intractable, without status epilepticus    Glucose is mildly elevated at 144, otherwise normal chem 8, pt reports that this happens to him frequently despite being compliant with his medication, believe overstimulus on the bus since he typically stays inside. He was observed in the ED for a few hours without any more seizure activity.   EKG unremarkable,  Normal vital signs. Pt requests dc.  Medications - No data to display  54 y.o.Seth Brock's evaluation in the Emergency Department is complete. It has been determined that no acute conditions requiring further emergency intervention are present at this time. The patient/guardian have been advised of the diagnosis and plan. We have discussed signs and symptoms that warrant return to the ED, such as changes or worsening in symptoms.  Vital signs are stable at discharge. Filed Vitals:   07/08/15 1828  BP: 152/72  Pulse: 63  Temp:   Resp: 18    Patient/guardian has voiced understanding and agreed to follow-up with the PCP or specialist.   Marlon Pel, PA-C 07/13/15 1508  Rolan Bucco, MD 07/20/15 1714

## 2015-07-08 NOTE — Discharge Instructions (Signed)
Epilepsy °People with epilepsy have times when they shake and jerk uncontrollably (seizures). This happens when there is a sudden change in brain function. Epilepsy may have many possible causes. Anything that disturbs the normal pattern of brain cell activity can lead to seizures. °HOME CARE  °· Follow your doctor's instructions about driving and safety during normal activities. °· Get enough sleep. °· Only take medicine as told by your doctor. °· Avoid things that you know can cause you to have seizures (triggers). °· Write down when your seizures happen and what you remember about each seizure. Write down anything you think may have caused the seizure to happen. °· Tell the people you live and work with that you have seizures. Make sure they know how to help you. They should: °¨ Cushion your head and body. °¨ Turn you on your side. °¨ Not restrain you. °¨ Not place anything inside your mouth. °¨ Call for local emergency medical help if there is any question about what has happened. °· Keep all follow-up visits with your doctor. This is very important. °GET HELP IF: °· You get an infection or start to feel sick. You may have more seizures when you are sick. °· You are having seizures more often. °· Your seizure pattern is changing. °GET HELP RIGHT AWAY IF:  °· A seizure does not stop after a few seconds or minutes. °· A seizure causes you to have trouble breathing. °· A seizure gives you a very bad headache. °· A seizure makes you unable to speak or use a part of your body. °Document Released: 08/04/2009 Document Revised: 07/28/2013 Document Reviewed: 05/19/2013 °ExitCare® Patient Information ©2015 [employer], LLC. This information is not intended to replace advice given to you by your health care provider. Make sure you discuss any questions you have with your health care provider. ° °

## 2015-07-08 NOTE — ED Notes (Signed)
Bed: WHALB Expected date:  Expected time:  Means of arrival:  Comments: Ems- seizure 

## 2015-07-09 ENCOUNTER — Other Ambulatory Visit: Payer: Self-pay | Admitting: Neurology

## 2015-07-10 ENCOUNTER — Telehealth: Payer: Self-pay | Admitting: Neurology

## 2015-07-11 ENCOUNTER — Encounter: Payer: Self-pay | Admitting: *Deleted

## 2015-07-11 ENCOUNTER — Telehealth: Payer: Self-pay | Admitting: *Deleted

## 2015-07-11 NOTE — Telephone Encounter (Signed)
Mailed letter of lab results to pt.  Attempted multiple times to reach by phone.  He had not returned messages.  Mailed letter 07-11-15, with results of valproic acid (good level).  He is to call back if questions.

## 2015-07-11 NOTE — Telephone Encounter (Signed)
Vimpat rx faxed to [facility] at [phone].

## 2015-07-12 ENCOUNTER — Encounter: Payer: Self-pay | Admitting: *Deleted

## 2015-07-12 ENCOUNTER — Telehealth: Payer: Self-pay | Admitting: Neurology

## 2015-07-12 NOTE — Telephone Encounter (Addendum)
We will let Dr. Terrace Arabia know about his mother.  His Vimpat was faxed to the [facility], as requested. Rx canceled at [facility].

## 2015-07-12 NOTE — Telephone Encounter (Signed)
Patient stopped by the office to let us know his mother has passed. His aunt will be coming with him to visits. Seth Brock. He is out of medication Vimpat 150 mg. They would like it to go to [facility] on [address]

## 2015-07-24 ENCOUNTER — Other Ambulatory Visit: Payer: Self-pay | Admitting: Nurse Practitioner

## 2015-07-26 ENCOUNTER — Telehealth: Payer: Self-pay | Admitting: *Deleted

## 2015-07-26 MED ORDER — LEVETIRACETAM 1000 MG PO TABS: 1000.0000 mg | ORAL_TABLET | Freq: Two times a day (BID) | ORAL | Status: DC

## 2015-07-26 MED ORDER — DIVALPROEX SODIUM ER 500 MG PO TB24: 500.0000 mg | ORAL_TABLET | Freq: Two times a day (BID) | ORAL | Status: DC

## 2015-07-26 NOTE — Telephone Encounter (Signed)
He was given 7 refills when seen in September. They will need to call the pharmacy each month so he does not run out

## 2015-07-26 NOTE — Telephone Encounter (Signed)
I called and was able to speak to pts sister briefly then pts now caregiver (Seth Brock, aunt) since his mother has died.  I do not see there names on DPR.  Did speak to them that needed to have pt sign DPR for [place] to speak to them.  She verbalized understanding.  She stated the vimpat prescription was not at the pharmacy - [facility].  I told her that it was faxed 07-11-15.  She uses [facility] at [address]. [address] ([address]  I will fax to them.  He had 2 seizures yesterday.  I explained that he needs to start on the vimpat right away.

## 2015-07-26 NOTE — Telephone Encounter (Signed)
I called [facility] at [address] (they do not have in stock) and did not have pts insurance information (when she goes next to take his insurance card with her).    Since pt has had recent seizures ( 2 yesterday) and has not been taking vimpat.  I LMVM for [name], aunt that he needs medication and to pick up at [facility] on [address] this time so he can get started on this right away.  They can get at [facility] next time.  She is to call back if questions.   Would like all medications in the future to be at the [facility].  Will need prescriptions on other seizure meds next month.

## 2015-07-27 NOTE — Telephone Encounter (Signed)
I called and spoke to pt then to aunt, Seth Brock.  She did get prescription finally.  Did not get my VM yesterday.  She went to [place] aide they then [facility] to [facility] who had ready.  She had medication.  I relayed that she should be set up for getting all prescriptions at [facility] now.  She verbalized understanding.

## 2015-08-03 ENCOUNTER — Emergency Department (HOSPITAL_COMMUNITY): Payer: Medicaid Other

## 2015-08-03 ENCOUNTER — Encounter (HOSPITAL_COMMUNITY): Payer: Self-pay | Admitting: Emergency Medicine

## 2015-08-03 ENCOUNTER — Observation Stay (HOSPITAL_COMMUNITY)
Admission: EM | Admit: 2015-08-03 | Discharge: 2015-08-04 | Disposition: A | Discharge status: Home / Self Care | Payer: Medicaid Other | Attending: Internal Medicine | Admitting: Internal Medicine

## 2015-08-03 ENCOUNTER — Telehealth: Payer: Self-pay | Admitting: Neurology

## 2015-08-03 DIAGNOSIS — G40409 Other generalized epilepsy and epileptic syndromes, not intractable, without status epilepticus: Secondary | ICD-10-CM | POA: Diagnosis not present

## 2015-08-03 DIAGNOSIS — Y998 Other external cause status: Secondary | ICD-10-CM | POA: Diagnosis not present

## 2015-08-03 DIAGNOSIS — T23201A Burn of second degree of right hand, unspecified site, initial encounter: Secondary | ICD-10-CM | POA: Diagnosis not present

## 2015-08-03 DIAGNOSIS — R569 Unspecified convulsions: Secondary | ICD-10-CM

## 2015-08-03 DIAGNOSIS — I480 Paroxysmal atrial fibrillation: Secondary | ICD-10-CM | POA: Diagnosis not present

## 2015-08-03 DIAGNOSIS — Y9389 Activity, other specified: Secondary | ICD-10-CM | POA: Diagnosis not present

## 2015-08-03 DIAGNOSIS — F70 Mild intellectual disabilities: Secondary | ICD-10-CM | POA: Diagnosis not present

## 2015-08-03 DIAGNOSIS — X088XXA Exposure to other specified smoke, fire and flames, initial encounter: Secondary | ICD-10-CM | POA: Diagnosis not present

## 2015-08-03 DIAGNOSIS — Z79899 Other long term (current) drug therapy: Secondary | ICD-10-CM | POA: Diagnosis not present

## 2015-08-03 DIAGNOSIS — I4892 Unspecified atrial flutter: Secondary | ICD-10-CM | POA: Insufficient documentation

## 2015-08-03 DIAGNOSIS — Y9289 Other specified places as the place of occurrence of the external cause: Secondary | ICD-10-CM | POA: Insufficient documentation

## 2015-08-03 DIAGNOSIS — Z87891 Personal history of nicotine dependence: Secondary | ICD-10-CM | POA: Diagnosis not present

## 2015-08-03 DIAGNOSIS — L905 Scar conditions and fibrosis of skin: Secondary | ICD-10-CM | POA: Insufficient documentation

## 2015-08-03 DIAGNOSIS — I10 Essential (primary) hypertension: Secondary | ICD-10-CM | POA: Diagnosis not present

## 2015-08-03 DIAGNOSIS — G40909 Epilepsy, unspecified, not intractable, without status epilepticus: Secondary | ICD-10-CM

## 2015-08-03 DIAGNOSIS — Z23 Encounter for immunization: Secondary | ICD-10-CM | POA: Diagnosis not present

## 2015-08-03 LAB — CBC WITH DIFFERENTIAL/PLATELET
BASOS ABS: 0.1 10*3/uL (ref 0.0–0.1)
Basophils Relative: 1 %
Eosinophils Absolute: 0 10*3/uL (ref 0.0–0.7)
Eosinophils Relative: 1 %
HCT: 38.5 % — ABNORMAL LOW (ref 39.0–52.0)
HEMOGLOBIN: 12.2 g/dL — AB (ref 13.0–17.0)
LYMPHS ABS: 2.3 10*3/uL (ref 0.7–4.0)
LYMPHS PCT: 36 %
MCH: 23 pg — ABNORMAL LOW (ref 26.0–34.0)
MCHC: 31.7 g/dL (ref 30.0–36.0)
MCV: 72.5 fL — ABNORMAL LOW (ref 78.0–100.0)
Monocytes Absolute: 0.4 10*3/uL (ref 0.1–1.0)
Monocytes Relative: 7 %
NEUTROS PCT: 55 %
Neutro Abs: 3.5 10*3/uL (ref 1.7–7.7)
Platelets: 227 10*3/uL (ref 150–400)
RBC: 5.31 MIL/uL (ref 4.22–5.81)
RDW: 15.9 % — ABNORMAL HIGH (ref 11.5–15.5)
WBC: 6.3 10*3/uL (ref 4.0–10.5)

## 2015-08-03 LAB — PROTIME-INR
INR: 1.06 (ref 0.00–1.49)
PROTHROMBIN TIME: 14 s (ref 11.6–15.2)

## 2015-08-03 LAB — COMPREHENSIVE METABOLIC PANEL
ALBUMIN: 4.2 g/dL (ref 3.5–5.0)
ALT: 28 U/L (ref 17–63)
AST: 34 U/L (ref 15–41)
Alkaline Phosphatase: 52 U/L (ref 38–126)
Anion gap: 10 (ref 5–15)
BUN: 10 mg/dL (ref 6–20)
CHLORIDE: 101 mmol/L (ref 101–111)
CO2: 25 mmol/L (ref 22–32)
CREATININE: 0.82 mg/dL (ref 0.61–1.24)
Calcium: 10 mg/dL (ref 8.9–10.3)
GFR calc Af Amer: >60 mL/min (ref >60)
Glucose, Bld: 118 mg/dL — ABNORMAL HIGH (ref 65–99)
POTASSIUM: 3.9 mmol/L (ref 3.5–5.1)
SODIUM: 136 mmol/L (ref 135–145)
Total Bilirubin: 0.6 mg/dL (ref 0.3–1.2)
Total Protein: 8.7 g/dL — ABNORMAL HIGH (ref 6.5–8.1)

## 2015-08-03 LAB — BASIC METABOLIC PANEL
Anion gap: 8 (ref 5–15)
BUN: 11 mg/dL (ref 6–20)
CALCIUM: 9.8 mg/dL (ref 8.9–10.3)
CO2: 25 mmol/L (ref 22–32)
CREATININE: 0.79 mg/dL (ref 0.61–1.24)
Chloride: 103 mmol/L (ref 101–111)
GFR calc non Af Amer: >60 mL/min (ref >60)
GLUCOSE: 101 mg/dL — AB (ref 65–99)
Potassium: 4.3 mmol/L (ref 3.5–5.1)
Sodium: 136 mmol/L (ref 135–145)

## 2015-08-03 LAB — APTT: APTT: 23 s — AB (ref 24–37)

## 2015-08-03 LAB — RAPID URINE DRUG SCREEN, HOSP PERFORMED
Amphetamines: NOT DETECTED
BARBITURATES: NOT DETECTED
BENZODIAZEPINES: NOT DETECTED
Cocaine: NOT DETECTED
Opiates: NOT DETECTED
Tetrahydrocannabinol: NOT DETECTED

## 2015-08-03 LAB — CBC
HCT: 35 % — ABNORMAL LOW (ref 39.0–52.0)
Hemoglobin: 11 g/dL — ABNORMAL LOW (ref 13.0–17.0)
MCH: 23 pg — AB (ref 26.0–34.0)
MCHC: 31.4 g/dL (ref 30.0–36.0)
MCV: 73.1 fL — ABNORMAL LOW (ref 78.0–100.0)
PLATELETS: 228 10*3/uL (ref 150–400)
RBC: 4.79 MIL/uL (ref 4.22–5.81)
RDW: 15.8 % — ABNORMAL HIGH (ref 11.5–15.5)
WBC: 5.3 10*3/uL (ref 4.0–10.5)

## 2015-08-03 LAB — TROPONIN I

## 2015-08-03 LAB — VALPROIC ACID LEVEL: Valproic Acid Lvl: 89 ug/mL (ref 50.0–100.0)

## 2015-08-03 LAB — MAGNESIUM: MAGNESIUM: 1.8 mg/dL (ref 1.7–2.4)

## 2015-08-03 LAB — ETHANOL

## 2015-08-03 LAB — CBG MONITORING, ED: GLUCOSE-CAPILLARY: 98 mg/dL (ref 65–99)

## 2015-08-03 LAB — TSH: TSH: 1.621 u[IU]/mL (ref 0.350–4.500)

## 2015-08-03 LAB — PHOSPHORUS: PHOSPHORUS: 3.1 mg/dL (ref 2.5–4.6)

## 2015-08-03 MED ORDER — LEVETIRACETAM 500 MG PO TABS
1000.0000 mg | ORAL_TABLET | Freq: Two times a day (BID) | ORAL | Status: DC
  Administered 2015-08-03 – 2015-08-04 (×2): 1000 mg via ORAL
  Filled 2015-08-03 (×3): qty 2

## 2015-08-03 MED ORDER — ACETAMINOPHEN 325 MG PO TABS
650.0000 mg | ORAL_TABLET | Freq: Four times a day (QID) | ORAL | Status: DC | PRN
  Administered 2015-08-03 – 2015-08-04 (×2): 650 mg via ORAL
  Filled 2015-08-03 (×2): qty 2

## 2015-08-03 MED ORDER — LACOSAMIDE 150 MG PO TABS: 1.0000 | ORAL_TABLET | Freq: Two times a day (BID) | ORAL | Status: DC

## 2015-08-03 MED ORDER — ONDANSETRON HCL 4 MG/2ML IJ SOLN: 4.0000 mg | Freq: Four times a day (QID) | INTRAMUSCULAR | Status: DC | PRN

## 2015-08-03 MED ORDER — SODIUM CHLORIDE 0.9 % IV SOLN
INTRAVENOUS | Status: AC
  Administered 2015-08-03: 21:00:00 via INTRAVENOUS

## 2015-08-03 MED ORDER — LACOSAMIDE 50 MG PO TABS
150.0000 mg | ORAL_TABLET | Freq: Two times a day (BID) | ORAL | Status: DC
  Administered 2015-08-03 – 2015-08-04 (×2): 150 mg via ORAL
  Filled 2015-08-03 (×2): qty 3

## 2015-08-03 MED ORDER — SILVER SULFADIAZINE 1 % EX CREA
TOPICAL_CREAM | Freq: Once | CUTANEOUS | Status: AC
  Administered 2015-08-03: 1 via TOPICAL
  Filled 2015-08-03: qty 50

## 2015-08-03 MED ORDER — ACETAMINOPHEN 650 MG RE SUPP: 650.0000 mg | Freq: Four times a day (QID) | RECTAL | Status: DC | PRN

## 2015-08-03 MED ORDER — INFLUENZA VAC SPLIT QUAD 0.5 ML IM SUSY
0.5000 mL | PREFILLED_SYRINGE | INTRAMUSCULAR | Status: AC
  Administered 2015-08-04: 0.5 mL via INTRAMUSCULAR
  Filled 2015-08-03 (×2): qty 0.5

## 2015-08-03 MED ORDER — DIVALPROEX SODIUM ER 500 MG PO TB24
500.0000 mg | ORAL_TABLET | Freq: Two times a day (BID) | ORAL | Status: DC
  Administered 2015-08-03 – 2015-08-04 (×2): 500 mg via ORAL
  Filled 2015-08-03 (×3): qty 1

## 2015-08-03 MED ORDER — DILTIAZEM HCL ER COATED BEADS 120 MG PO CP24
120.0000 mg | ORAL_CAPSULE | Freq: Every day | ORAL | Status: DC
  Administered 2015-08-03 – 2015-08-04 (×2): 120 mg via ORAL
  Filled 2015-08-03 (×2): qty 1

## 2015-08-03 MED ORDER — ONDANSETRON HCL 4 MG PO TABS: 4.0000 mg | ORAL_TABLET | Freq: Four times a day (QID) | ORAL | Status: DC | PRN

## 2015-08-03 MED ORDER — SODIUM CHLORIDE 0.9 % IJ SOLN: 3.0000 mL | Freq: Two times a day (BID) | INTRAMUSCULAR | Status: DC
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
## 2015-08-03 NOTE — H&P (Signed)
[facility] History and Physical  Angela BurkeLionel Kothari HYQ:[id number] DOB: [birth year] DOA: 08/03/2015  Referring physician: ER physician: Dr. Arby BarrettePfeiffer Marcy [facility]   Chief Complaint: seizures   HPI:  54 year old male with past medical history of seizures who [facility] with concern for 2 episodes of witnessed seizures at home. His caretaker at bedside provides most of the history . She said this seizure was different than other seizures he had in past. This time his eyes were bulging out. Caretaker not sure how long seizure lasted. At this time he is not postictal. No loss of consciousness. No other reports of chest pain, shortness of breath or palpitations. No falls. NO abdominal pain, nausea or vomiting. No fevers. No blood in stool or urine.   In ED. BP was 142/64 - 161/108. Blood wokr was notable for hemoglobin of 11, normal troponin. CXR showed no acute cardiopulmonary findings. He had intermittent epsiodes of a fib on telemetry. The 12 lead EKG showed sinus rhythm. ED consulted cardio for input. Pt admitted for observation.  Assessment & Plan    Principal Problem: Seizures (HCC) - Resumed home meds - Depakote WNL - Seizure precautions  Active Problems: Paroxysmal atrial fibrillation, new onset - Obtain 12 lead EKG - Cardiology consulted by ED  DVT prophylaxis:  - SCD's bilaterally   Radiological Exams on Admission: Dg Chest 2 View 08/03/2015  No active cardiopulmonary disease. Electronically Signed   By: Harmon PierJeffrey  Hu M.D.   On: 08/03/2015 16:40    EKG: I have personally reviewed EKG. EKG shows pending   Code Status: Full Family Communication: Plan of care discussed with the patient  Disposition Plan: Admit for further evaluation  Manson PasseyEVINE, Ananda Caya, MD  [facility] Pager [phone]  Time spent in minutes: 75 minutes  Review of Systems:  Constitutional: Negative for fever, chills and malaise/fatigue. Negative for diaphoresis.  HENT:  Negative for hearing loss, ear pain, nosebleeds, congestion, sore throat, neck pain, tinnitus and ear discharge.   Eyes: Negative for blurred vision, double vision, photophobia, pain, discharge and redness.  Respiratory: Negative for cough, hemoptysis, sputum production, shortness of breath, wheezing and stridor.   Cardiovascular: Negative for chest pain, palpitations, orthopnea, claudication and leg swelling.  Gastrointestinal: Negative for nausea, vomiting and abdominal pain. Negative for heartburn, constipation, blood in stool and melena.  Genitourinary: Negative for dysuria, urgency, frequency, hematuria and flank pain.  Musculoskeletal: Negative for myalgias, back pain, joint pain and falls.  Skin: Negative for itching and rash.  Neurological: per HPI Endo/Heme/Allergies: Negative for environmental allergies and polydipsia. Does not bruise/bleed easily.  Psychiatric/Behavioral: Negative for suicidal ideas. The patient is not nervous/anxious.      Past Medical History  Diagnosis Date  . Epilepsy seizure, nonconvulsive, generalized [facility](HCC)    Past Surgical History  Procedure Laterality Date  . None listed     Social History:  reports that he has quit smoking. He has never used smokeless tobacco. He reports that he does not drink alcohol or use illicit drugs.  No Known Allergies  Family History: Hypertension in family    Prior to Admission medications   Medication Sig Start Date End Date Taking? Authorizing Provider  divalproex (DEPAKOTE ER) 500 MG 24 hr tablet Take 1 tablet (500 mg total) by mouth 2 (two) times daily. Will need November 2016 to start.  [facility] 07/[facility]  Yes Nilda RiggsNancy Carolyn Martin, NP  levETIRAcetam (KEPPRA) 1000 MG tablet Take 1 tablet (1,000  mg total) by mouth 2 (two) times daily. Will start in November 2016 .  Changing from [facility] to [facility]. 07/26/15  Yes Nilda Riggs, NP  VIMPAT 150 MG TABS TAKE ONE TABLET BY MOUTH TWICE DAILY  07/11/15  Yes Levert Feinstein, MD   Physical Exam: Filed Vitals:   08/03/15 1411 08/03/15 1415 08/03/15 1634  BP: 164/108 142/64 154/79  Pulse: 73 72 69  Temp: 98.4 F (36.9 C)    TempSrc: Oral    Resp: SpO2: 98% 96% 96%    Physical Exam  Constitutional: Appears well-developed and well-nourished. No distress.  HENT: Normocephalic. No tonsillar erythema or exudates Eyes: Conjunctivae  are normal. No scleral icterus.  Neck: Normal ROM. Neck supple. No JVD. No tracheal deviation. No thyromegaly.  CVS: RRR, S1/S2 +, no murmurs, no gallops, no carotid bruit.  Pulmonary: Effort and breath sounds normal, no stridor, rhonchi, wheezes, rales.  Abdominal: Soft. BS +,  no distension, tenderness, rebound or guarding.  Musculoskeletal: Normal range of motion. No edema and no tenderness.  Lymphadenopathy: No lymphadenopathy noted, cervical, inguinal. Neuro: Alert. Normal reflexes, muscle tone coordination. No focal neurologic deficits. Skin: Skin is warm and dry.  Psychiatric: Normal mood and affect. Behavior normal.   Labs on Admission:  Basic Metabolic Panel:  Recent Labs Lab 08/03/15 1457  NA 136  K 4.3  CL 103  CO2 25  GLUCOSE 101*  BUN 11  CREATININE 0.79  CALCIUM 9.8   Liver Function Tests: No results for input(s): AST, ALT, ALKPHOS, BILITOT, PROT, ALBUMIN in the last 168 hours. No results for input(s): LIPASE, AMYLASE in the last 168 hours. No results for input(s): AMMONIA in the last 168 hours. CBC:  Recent Labs Lab 08/03/15 1457  WBC 5.3  HGB 11.0*  HCT 35.0*  MCV 73.1*  PLT 228   Cardiac Enzymes:  Recent Labs Lab 08/03/15 1457  TROPONINI <0.03   BNP: Invalid input(s): POCBNP CBG:  Recent Labs Lab 08/03/15 1410  GLUCAP 98    If 7PM-7AM, please contact night-coverage [email or web address] Password [email or web address] 08/03/2015, 4:59 PM

## 2015-08-03 NOTE — Progress Notes (Addendum)
Patient listed as having Medicaid [place] Access insurance.  [facility] on [facility] card [facility]  [facility].  EDCm informed patient that if he wanted to change the pcp listed on his insurance card he would have to call the DSS.  [facility] to call the [facility] to send him another card.  Patient and family thankful for services.  No further EDCM needs at this time.
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
## 2015-08-03 NOTE — ED Notes (Signed)
Patients aunt is at bedside. Said that patients seizures today were very different from seizures he had had in the past. She states that the one's today were much more violent and that his eye were "bulging out of his head". MD aware.
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
## 2015-08-03 NOTE — ED Notes (Signed)
Patient transported to X-ray 

## 2015-08-04 ENCOUNTER — Observation Stay (HOSPITAL_BASED_OUTPATIENT_CLINIC_OR_DEPARTMENT_OTHER): Payer: Medicaid Other

## 2015-08-04 DIAGNOSIS — I480 Paroxysmal atrial fibrillation: Secondary | ICD-10-CM

## 2015-08-04 DIAGNOSIS — I4892 Unspecified atrial flutter: Secondary | ICD-10-CM | POA: Diagnosis not present

## 2015-08-04 DIAGNOSIS — R569 Unspecified convulsions: Secondary | ICD-10-CM

## 2015-08-04 DIAGNOSIS — I10 Essential (primary) hypertension: Secondary | ICD-10-CM

## 2015-08-04 LAB — BASIC METABOLIC PANEL
ANION GAP: 6 (ref 5–15)
BUN: 11 mg/dL (ref 6–20)
CHLORIDE: 104 mmol/L (ref 101–111)
CO2: 26 mmol/L (ref 22–32)
Calcium: 9.7 mg/dL (ref 8.9–10.3)
Creatinine, Ser: 0.76 mg/dL (ref 0.61–1.24)
GFR calc Af Amer: >60 mL/min (ref >60)
GFR calc non Af Amer: >60 mL/min (ref >60)
GLUCOSE: 108 mg/dL — AB (ref 65–99)
POTASSIUM: 4.4 mmol/L (ref 3.5–5.1)
Sodium: 136 mmol/L (ref 135–145)

## 2015-08-04 LAB — CBC
HEMATOCRIT: 37.1 % — AB (ref 39.0–52.0)
HEMOGLOBIN: 11.6 g/dL — AB (ref 13.0–17.0)
MCH: 22.7 pg — ABNORMAL LOW (ref 26.0–34.0)
MCHC: 31.3 g/dL (ref 30.0–36.0)
MCV: 72.5 fL — AB (ref 78.0–100.0)
Platelets: 229 10*3/uL (ref 150–400)
RBC: 5.12 MIL/uL (ref 4.22–5.81)
RDW: 15.8 % — AB (ref 11.5–15.5)
WBC: 5.1 10*3/uL (ref 4.0–10.5)

## 2015-08-04 LAB — GLUCOSE, CAPILLARY: Glucose-Capillary: 128 mg/dL — ABNORMAL HIGH (ref 65–99)

## 2015-08-04 MED ORDER — DILTIAZEM HCL ER COATED BEADS 120 MG PO CP24: 120.0000 mg | ORAL_CAPSULE | Freq: Every day | ORAL | Status: DC

## 2015-08-04 NOTE — Telephone Encounter (Signed)
Patient has an appt in Aug 08 2015

## 2015-08-04 NOTE — Progress Notes (Signed)
Echocardiogram 2D Echocardiogram has been performed.  Dorothey BasemanReel, Chrissi Crow M 08/04/2015, 2:27 PM

## 2015-08-04 NOTE — Evaluation (Signed)
Physical Therapy One TIme Evaluation Patient Details Name: Seth Brock MRN: [id number] DOB: 11-Feb-1961 Today's Date: 08/04/2015   History of Present Illness  54 year old male with past medical history of seizures who presented to [facility] ED with concern for 2 episodes of witnessed seizures at home  Clinical Impression  Patient evaluated by Physical Therapy with no further acute PT needs identified. All education has been completed and the patient has no further questions.  Pt presents at his baseline in regards to mobility. See below for any follow-up Physical Therapy or equipment needs. PT is signing off. Thank you for this referral.     Follow Up Recommendations No PT follow up    Equipment Recommendations  None recommended by PT    Recommendations for Other Services       Precautions / Restrictions Precautions Precaution Comments: seizure Restrictions Weight Bearing Restrictions: No      Mobility  Bed Mobility Overal bed mobility: Independent                Transfers Overall transfer level: Independent                  Ambulation/Gait Ambulation/Gait assistance: Independent Ambulation Distance (Feet): 400 Feet Assistive device: None Gait Pattern/deviations: WFL(Within Functional Limits)   Gait velocity interpretation: at or above normal speed for age/gender General Gait Details: pt denies any symptoms  Stairs            Wheelchair Mobility    Modified Rankin (Stroke Patients Only)       Balance Overall balance assessment: History of Falls (falls due to seizures)                           High level balance activites: Backward walking;Direction changes;Sudden stops;Head turns High Level Balance Comments: pt performed above while ambulating without any difficulty, no LOB             Pertinent Vitals/Pain Pain Assessment: No/denies pain    Home Living Family/patient expects to be discharged to:: Group home                  Additional Comments: per chart from group home    Prior Function Level of Independence: Independent               Hand Dominance        Extremity/Trunk Assessment   Upper Extremity Assessment: Overall WFL for tasks assessed           Lower Extremity Assessment: Overall WFL for tasks assessed      Cervical / Trunk Assessment: Normal  Communication   Communication: No difficulties  Cognition Arousal/Alertness: Awake/alert Behavior During Therapy: WFL for tasks assessed/performed Overall Cognitive Status: Within Functional Limits for tasks assessed (baseline cognitive delay?, no hx)                      General Comments      Exercises        Assessment/Plan    PT Assessment Patent does not need any further PT services  PT Diagnosis     PT Problem List    PT Treatment Interventions     PT Goals (Current goals can be found in the Care Plan section) Acute Rehab PT Goals PT Goal Formulation: All assessment and education complete, DC therapy    Frequency     Barriers to discharge  Co-evaluation               End of Session   Activity Tolerance: Patient tolerated treatment well Patient left: in chair;with family/visitor present      Functional Assessment Tool Used: clinical judgement Functional Limitation: Mobility: Walking and moving around Mobility: Walking and Moving Around Current Status [phone](G8978): 0 percent impaired, limited or restricted Mobility: Walking and Moving Around Goal Status [phone](G8979): 0 percent impaired, limited or restricted Mobility: Walking and Moving Around Discharge Status [phone](G8980): 0 percent impaired, limited or restricted    Time: [id number] PT Time Calculation (min) (ACUTE ONLY): 9 min   Charges:   PT Evaluation $Initial PT Evaluation Tier I: 1 Procedure     PT G Codes:   PT G-Codes **NOT FOR INPATIENT CLASS** Functional Assessment Tool Used: clinical judgement Functional Limitation:  Mobility: Walking and moving around Mobility: Walking and Moving Around Current Status ([id number](G8978): 0 percent impaired, limited or restricted Mobility: Walking and Moving Around Goal Status ([id number](G8979): 0 percent impaired, limited or restricted Mobility: Walking and Moving Around Discharge Status ([id number](G8980): 0 percent impaired, limited or restricted    [name],[name] 08/04/2015, 11:18 AM [name], PT, DPT 08/04/2015 Pager: [phone]

## 2015-08-04 NOTE — Progress Notes (Signed)
Spoke with pt and aunt concerning PCP. Pt will need to call SS for listing of PCP for Medicaid on the back of pt's card. It was noted that [facility]. Pt and aunt stated that they only went there for urgent care and want an appointment with [facility]. An appointment was made for Fr. 10/21 at 11:15 AM, pt and aunt are aware.

## 2015-08-04 NOTE — Discharge Summary (Signed)
Physician Discharge Summary  Seth Brock FAO:[id number] DOB: Sep 10, 1961 DOA: 08/03/2015  PCP: No PCP Per Patient- infor to be provided on [facility] on discharge, he has primary neurologist who monitors seizures  Admit date: 08/03/2015 Discharge date: 08/04/2015  Recommendations for Outpatient Follow-up:  1. Please note new medication is Cardizem for heart rate control.  Discharge Diagnoses:  Principal Problem:   Seizures (HCC)   New on set atrial fibrillation   Discharge Condition: stable   Diet recommendation: as tolerated   History of present illness:  54 year old male with past medical history of seizures who presented to [facility] ED with concern for 2 episodes of witnessed seizures at home. His caretaker at bedside provides most of the history . She said this seizure was different than other seizures he had in past. This time his eyes were bulging out. Caretaker not sure how long seizure lasted. At this time he is not postictal. No loss of consciousness. No other reports of chest pain, shortness of breath or palpitations. No falls. NO abdominal pain, nausea or vomiting. No fevers. No blood in stool or urine.   In ED. BP was 142/64 - 161/108. Blood wokr was notable for hemoglobin of 11, normal troponin. CXR showed no acute cardiopulmonary findings. He had intermittent epsiodes of a fib on telemetry. The 12 lead EKG showed sinus rhythm. ED consulted cardio for input. Pt admitted for observation.  Hospital Course:   Assessment & Plan    Principal Problem: Seizures (HCC) - Patient can continue home medications for seizure control, Depakote, Vimpat and Keppra - No seizures since the admission - Patient will follow up with neurology on an outpatient basis  Active Problems: Paroxysmal atrial fibrillation, new onset -12-lead EKG with sinus rhythm but on telemetry paroxysmal atrial fibrillation  - Cardiology has seen the patient in consultation. Recommends Cardizem.  - Patient has frequent  seizures and is at high risk of falls for which reason he would not be a good candidate for anticoagulation.  - His CHADS vasc score is pretty low at 1-2 (age and HTN), although no official diagnosis of HTN - 2-D echo will be obtained during this hospital stay. We'll follow-up on results prior to discharge.   Essential hypertension  - Started Cardizem 120 mg daily for cardiology   DVT prophylax   Signed:  Manson Passey, MD  [facility] 08/04/2015, 9:09 AM  Pager #: [phone]  Time spent in minutes: more than 30 minutes  Procedures:  2 D ECHO   Consultations:  Cardiology   Discharge Exam: Filed Vitals:   08/04/15 0606  BP: 120/57  Pulse: 64  Temp: 97.7 F (36.5 C)  Resp: 20   Filed Vitals:   08/03/15 1910 08/03/15 1940 08/03/15 2029 08/04/15 0606  BP:  159/87 156/83 120/57  Pulse: 62   64  Temp:   98.7 F (37.1 C) 97.7 F (36.5 C)  TempSrc:   Oral Oral  Resp: Height:    (1.854 m)   Weight:   115.304 kg (254 lb 3.2 oz) 114.2 kg (251 lb 12.3 oz)  SpO2: 98%  97% 100%    General: Pt is alert, follows commands appropriately, not in acute distress Cardiovascular: Regular rate and rhythm, S1/S2 +, no murmurs Respiratory: Clear to auscultation bilaterally, no wheezing, no crackles, no rhonchi Abdominal: Soft, non tender, non distended, bowel sounds +, no guarding Extremities: no edema, no cyanosis, pulses palpable bilaterally DP and PT Neuro: Grossly nonfocal  Discharge  Instructions  Discharge Instructions    Call MD for:  difficulty breathing, headache or visual disturbances    Complete by:  As directed      Call MD for:  persistant dizziness or light-headedness    Complete by:  As directed      Call MD for:  persistant nausea and vomiting    Complete by:  As directed      Call MD for:  severe uncontrolled pain    Complete by:  As directed      Diet - low sodium heart healthy    Complete by:  As directed      Discharge  instructions    Complete by:  As directed   1. Please note new medication is Cardizem for heart rate control.     Increase activity slowly    Complete by:  As directed             Medication List    TAKE these medications        diltiazem 120 MG 24 hr capsule  Commonly known as:  CARDIZEM CD  Take 1 capsule (120 mg total) by mouth daily.     divalproex 500 MG 24 hr tablet  Commonly known as:  DEPAKOTE ER  Take 1 tablet (500 mg total) by mouth 2 (two) times daily. Will need November 2016 to start.  Changing from [place] to [facility].     levETIRAcetam 1000 MG tablet  Commonly known as:  KEPPRA  Take 1 tablet (1,000 mg total) by mouth 2 (two) times daily. Will start in November 2016 .  Changing from [facility] to [facility].     VIMPAT 150 MG Tabs  Generic drug:  Lacosamide  TAKE ONE TABLET BY MOUTH TWICE DAILY           Follow-up Information    Follow up with [facility]    . Schedule an appointment as soon as possible for a visit in 1 week.   Why:  Follow up appt after recent hospitalization   Contact information:   [address] [facility] [address] [phone]       The results of significant diagnostics from this hospitalization (including imaging, microbiology, ancillary and laboratory) are listed below for reference.    Significant Diagnostic Studies: Dg Chest 2 View  08/03/2015  CLINICAL DATA:  Acute seizure today. EXAM: CHEST  2 VIEW COMPARISON:  None. FINDINGS: The cardiomediastinal silhouette is unremarkable. There is no evidence of focal airspace disease, pulmonary edema, suspicious pulmonary nodule/mass, pleural effusion, or pneumothorax. No acute bony abnormalities are identified. IMPRESSION: No active cardiopulmonary disease. Electronically Signed   By: Harmon Pier M.D.   On: 08/03/2015 16:40    Microbiology: No results found for this or any previous visit (from the past 240 hour(s)).   Labs: Basic  Metabolic Panel:  Recent Labs Lab 08/03/15 1457 08/03/15 2206 08/04/15 0456  NA 136 136 136  K 4.3 3.9 4.4  CL 103 101 104  CO2 GLUCOSE 101* 118* 108*  BUN CREATININE 0.79 0.82 0.76  CALCIUM 9.8 10.0 9.7  MG  --  1.8  --   PHOS  --  3.1  --    Liver Function Tests:  Recent Labs Lab 08/03/15 2206  AST 34  ALT 28  ALKPHOS 52  BILITOT 0.6  PROT 8.7*  ALBUMIN 4.2   No results for input(s): LIPASE, AMYLASE in  the last 168 hours. No results for input(s): AMMONIA in the last 168 hours. CBC:  Recent Labs Lab 08/03/15 1457 08/03/15 2206 08/04/15 0456  WBC 5.3 6.3 5.1  NEUTROABS  --  3.5  --   HGB 11.0* 12.2* 11.6*  HCT 35.0* 38.5* 37.1*  MCV 73.1* 72.5* 72.5*  PLT 228 227 229   Cardiac Enzymes:  Recent Labs Lab 08/03/15 1457  TROPONINI <0.03   BNP: BNP (last 3 results) No results for input(s): BNP in the last 8760 hours.  ProBNP (last 3 results) No results for input(s): PROBNP in the last 8760 hours.  CBG:  Recent Labs Lab 08/03/15 1410 08/04/15 0726  GLUCAP 98 128*

## 2015-08-04 NOTE — Discharge Instructions (Addendum)
Atrial Flutter °Atrial flutter is a heart rhythm that can cause the heart to beat very fast (tachycardia). It originates in the upper chambers of the heart (atria). In atrial flutter, the top chambers of the heart (atria) often beat much faster than the bottom chambers of the heart (ventricles). Atrial flutter has a regular "saw toothed" appearance in an EKG readout. An EKG is a test that records the electrical activity of the heart. Atrial flutter can cause the heart to beat up to 150 beats per minute (BPM). Atrial flutter can either be short lived (paroxysmal) or permanent.  °CAUSES  °Causes of atrial flutter can be many. Some of these include: °· Heart related issues: °¨ Heart attack (myocardial infarction). °¨ Heart failure. °¨ Heart valve problems. °¨ Poorly controlled high blood pressure (hypertension). °¨ After open heart surgery. °· Lung related issues: °¨ A blood clot in the lungs (pulmonary embolism). °¨ Chronic obstructive pulmonary disease (COPD). Medications used to treat COPD can attribute to atrial flutter. °· Other related causes: °¨ Hyperthyroidism. °¨ Caffeine. °¨ Some decongestant cold medications. °¨ Low electrolyte levels such as potassium or magnesium. °¨ Cocaine. °SYMPTOMS °· An awareness of your heart beating rapidly (palpitations). °· Shortness of breath. °· Chest pain. °· Low blood pressure (hypotension). °· Dizziness or fainting. °DIAGNOSIS  °Different tests can be performed to diagnose atrial flutter.  °· An EKG. °· Holter monitor. This is a 24-hour recording of your heart rhythm. You will also be given a diary. Write down all symptoms that you have and what you were doing at the time you experienced symptoms. °· Cardiac event monitor. This small device can be worn for up to 30 days. When you have heart symptoms, you will push a button on the device. This will then record your heart rhythm. °· Echocardiogram. This is an imaging test to look at your heart. Your caregiver will look at your  heart valves and the ventricles. °· Stress test. This test can help determine if the atrial flutter is related to exercise or if coronary artery disease is present. °· Laboratory studies will look at certain blood levels like: °¨ Complete blood count (CBC). °¨ Potassium. °¨ Magnesium. °¨ Thyroid function. °TREATMENT  °Treatment of atrial flutter varies. A combination of therapies may be used or sometimes atrial flutter may need only 1 type of treatment.  °Lab work: °If your blood work, such as your electrolytes (potassium, magnesium) or your thyroid function tests, are abnormal, your caregiver will treat them accordingly.  °Medication:  °There are several different types of medications that can convert your heart to a normal rhythm and prevent atrial flutter from reoccurring.  °Nonsurgical procedures: °Nonsurgical techniques may be used to control atrial flutter. Some examples include: °· Cardioversion. This technique uses either drugs or an electrical shock to restore a normal heart rhythm: °¨ Cardioversion drugs may be given through an intravenous (IV) line to help "reset" the heart rhythm. °¨ In electrical cardioversion, your caregiver shocks your heart with electrical energy. This helps to reset the heartbeat to a normal rhythm. °· Ablation. If atrial flutter is a persistent problem, an ablation may be needed. This procedure is done under mild sedation. High frequency radio-wave energy is used to destroy the area of heart tissue responsible for atrial flutter. °SEEK IMMEDIATE MEDICAL CARE IF:  °You have: °· Dizziness. °· Near fainting or fainting. °· Shortness of breath. °· Chest pain or pressure. °· Sudden nausea or vomiting. °· Profuse sweating. °If you have the above symptoms,   call your local emergency service immediately! Do not drive yourself to the hospital. MAKE SURE YOU:   Understand these instructions.  Will watch your condition.  Will get help right away if you are not doing well or get worse.    This information is not intended to replace advice given to you by your health care provider. Make sure you discuss any questions you have with your health care provider.   Document Released: 02/23/2009 Document Revised: 10/28/2014 Document Reviewed: 04/21/2015 Elsevier Interactive Patient Education 2016 Elsevier Inc. Diltiazem extended-release capsules or tablets What is this medicine? DILTIAZEM (dil TYE a zem) is a calcium-channel blocker. It affects the amount of calcium found in your heart and muscle cells. This relaxes your blood vessels, which can reduce the amount of work the heart has to do. This medicine is used to treat high blood pressure and chest pain caused by angina. This medicine may be used for other purposes; ask your health care provider or pharmacist if you have questions. What should I tell my health care provider before I take this medicine? They need to know if you have any of these conditions: -heart problems, low blood pressure, irregular heartbeat -liver disease -previous heart attack -an unusual or allergic reaction to diltiazem, other medicines, foods, dyes, or preservatives -pregnant or trying to get pregnant -breast-feeding How should I use this medicine? Take this medicine by mouth with a glass of water. Follow the directions on the prescription label. Swallow whole, do not crush or chew. Ask your doctor or pharmacist if your should take this medicine with food. Take your doses at regular intervals. Do not take your medicine more often then directed. Do not stop taking except on the advice of your doctor or health care professional. Ask your doctor or health care professional how to gradually reduce the dose. Talk to your pediatrician regarding the use of this medicine in children. Special care may be needed. Overdosage: If you think you have taken too much of this medicine contact a poison control center or emergency room at once. NOTE: This medicine is only for  you. Do not share this medicine with others. What if I miss a dose? If you miss a dose, take it as soon as you can. If it is almost time for your next dose, take only that dose. Do not take double or extra doses. What may interact with this medicine? Do not take this medicine with any of the following medications: -cisapride -hawthorn -pimozide -ranolazine -red yeast rice This medicine may also interact with the following medications: -buspirone -carbamazepine -cimetidine -cyclosporine -digoxin -local anesthetics or general anesthetics -lovastatin -medicines for anxiety or difficulty sleeping like midazolam and triazolam -medicines for high blood pressure or heart problems -quinidine -rifampin, rifabutin, or rifapentine This list may not describe all possible interactions. Give your health care provider a list of all the medicines, herbs, non-prescription drugs, or dietary supplements you use. Also tell them if you smoke, drink alcohol, or use illegal drugs. Some items may interact with your medicine. What should I watch for while using this medicine? Check your blood pressure and pulse rate regularly. Ask your doctor or health care professional what your blood pressure and pulse rate should be and when you should contact him or her. You may feel dizzy or lightheaded. Do not drive, use machinery, or do anything that needs mental alertness until you know how this medicine affects you. To reduce the risk of dizzy or fainting spells, do not sit or  stand up quickly, especially if you are an older patient. Alcohol can make you more dizzy or increase flushing and rapid heartbeats. Avoid alcoholic drinks. What side effects may I notice from receiving this medicine? Side effects that you should report to your doctor or health care professional as soon as possible: -allergic reactions like skin rash, itching or hives, swelling of the face, lips, or tongue -confusion, mental depression -feeling  faint or lightheaded, falls -redness, blistering, peeling or loosening of the skin, including inside the mouth -slow, irregular heartbeat -swelling of the feet and ankles -unusual bleeding or bruising, pinpoint red spots on the skin Side effects that usually do not require medical attention (report to your doctor or health care professional if they continue or are bothersome): -constipation or diarrhea -difficulty sleeping -facial flushing -headache -nausea, vomiting -sexual dysfunction -weak or tired This list may not describe all possible side effects. Call your doctor for medical advice about side effects. You may report side effects to FDA at 1-800-FDA-1088. Where should I keep my medicine? Keep out of the reach of children. Store at room temperature between 15 and 30 degrees C (59 and 86 degrees F). Protect from humidity. Throw away any unused medicine after the expiration date. NOTE: This sheet is a summary. It may not cover all possible information. If you have questions about this medicine, talk to your doctor, pharmacist, or health care provider.    2016, Elsevier/Gold Standard. (2008-01-28 14:35:47)

## 2015-08-04 NOTE — Progress Notes (Signed)
Discussed with patient and aunt discharge instructions, both verbalized agreement and understanding.  Patient's IV was discontinued with no complications.  Patient to go down in wheelchair with all belongings to go home in private vehicle. Morene CrockerKimberly Billey Wojciak, RN-BSN, 08/04/2015, [phone]

## 2015-08-04 NOTE — Consult Note (Signed)
CARDIOLOGY CONSULT NOTE   Patient ID: Seth Brock MRN: [id number] DOB/AGE: 12-17-1960 54 y.o.  Admit date: 08/03/2015  Primary Physician   No PCP Per Patient Primary Cardiologist   New Reason for Consultation  afib with RVR  HPI: Seth Brock is a 54 y.o. male with a history of seizure disorder on multiple medications and mild mental retardation who presented to [facility] with concerns of two episodes of witnessed seizures at home. Telemertry revealed episodes of afib and cardiology is consulted.   He has mental retardation and lived with his mother for his entire life until last month when she died after a massive stroke. He now lives alone at a boarding house in [place]. His aunt is in the process of trying to get him to live with her as he is unfit to live alone. He has a long history of seizures and falls very frequently. His aunt shows me bruises and bandages all over his body from recent falls due to seizure activity. Patient has no past cardiac history; however he does not follow with a PCP. He denies a history of HTN, DM, previous CVA or CHF. However, review of his BPs while admitted have been mildly elevated and he has some mild hyperglycemia. Patient does not feel this dysrhythmia and denies palpations, CP, SOB, lightheadedness/dizziness or syncope besides his known seizures. He is feeling well currently and has no complaints.     Past Medical History  Diagnosis Date  . Epilepsy seizure, nonconvulsive, generalized [facility])      Past Surgical History  Procedure Laterality Date  . None listed      No Known Allergies  I have reviewed the patient's current medications . diltiazem  120 mg Oral Daily  . divalproex  500 mg Oral BID  . Influenza vac split quadrivalent PF  0.5 mL Intramuscular Tomorrow-1000  . lacosamide  150 mg Oral BID  . levETIRAcetam  1,000 mg Oral BID  . sodium chloride  3 mL Intravenous Q12H     acetaminophen **OR** acetaminophen, ondansetron **OR**  ondansetron (ZOFRAN) IV  Prior to Admission medications   Medication Sig Start Date End Date Taking? Authorizing Provider  divalproex (DEPAKOTE ER) 500 MG 24 hr tablet Take 1 tablet (500 mg total) by mouth 2 (two) times daily. Will need November 2016 to start.  Changing from [place] to [facility]. 07/26/15  Yes Nilda Riggs, NP  levETIRAcetam (KEPPRA) 1000 MG tablet Take 1 tablet (1,000 mg total) by mouth 2 (two) times daily. Will start in November 2016 .  Changing from [facility] to [facility]. 07/26/15  Yes Nilda Riggs, NP  VIMPAT 150 MG TABS TAKE ONE TABLET BY MOUTH TWICE DAILY 07/11/15  Yes Levert Feinstein, MD     Social History   Social History  . Marital Status: Single    Spouse Name: N/A  . Number of Children: N/A  . Years of Education: N/A   Occupational History  . Not on file.   Social History Main Topics  . Smoking status: Former Games developer  . Smokeless tobacco: Never Used  . Alcohol Use: No     Comment: patient quit in 1993   . Drug Use: No     Comment: patient quit 1993  . Sexual Activity: Not on file   Other Topics Concern  . Not on file   Social History Narrative   Patient lives with aunt and mother.    Patient does not work.    Patient drinks  caffeine daily. (Tea)    Family Status  Relation Status Death Age  . Mother Deceased   . Father Deceased   . Sister Alive   . Brother Alive   . Sister Alive   . Sister Alive   . Brother Alive   . Brother Alive    History reviewed. No pertinent family history.   ROS:  Full 14 point review of systems complete and found to be negative unless listed above.  Physical Exam: Blood pressure 120/57, pulse 64, temperature 97.7 F (36.5 C), temperature source Oral, resp. rate 20, height 6\' 1"  (1.854 m), weight 251 lb 12.3 oz (114.2 kg), SpO2 100 %.  General: Well developed, well nourished, male in no acute distress Head: Eyes PERRLA, No xanthomas.   Normocephalic and atraumatic, oropharynx without edema or exudate.  Dentition:  Lungs: CTAB Heart: HRRR S1 S2, no rub/gallop, Heart irregular rate and rhythm with S1, S2  murmur. pulses are 2+ extrem.   Neck: No carotid bruits. No lymphadenopathy. No JVD. Abdomen: Bowel sounds present, abdomen soft and non-tender without masses or hernias noted. Msk:  No spine or cva tenderness. No weakness, no joint deformities or effusions. Extremities: No clubbing or cyanosis. No edema. Bandage on right hand. Bruises on legs.  Neuro: Alert and oriented X 3. No focal deficits noted. Psych:  Good affect, responds appropriately Skin: No rashes or lesions noted.  Labs:   Lab Results  Component Value Date   WBC 5.1 08/04/2015   HGB 11.6* 08/04/2015   HCT 37.1* 08/04/2015   MCV 72.5* 08/04/2015   PLT 229 08/04/2015    Recent Labs  08/03/15 2206  INR 1.06    Recent Labs Lab 08/03/15 2206 08/04/15 0456  NA 136 136  K 3.9 4.4  CL 101 104  CO2 25 26  BUN 10 11  CREATININE 0.82 0.76  CALCIUM 10.0 9.7  PROT 8.7*  --   BILITOT 0.6  --   ALKPHOS 52  --   ALT 28  --   AST 34  --   GLUCOSE 118* 108*  ALBUMIN 4.2  --    MAGNESIUM  Date Value Ref Range Status  08/03/2015 1.8 1.7 - 2.4 mg/dL Final    Recent Labs  14/78/2910/13/16 1457  TROPONINI <0.03    TSH  Date/Time Value Ref Range Status  08/03/2015 10:06 PM 1.621 0.350 - 4.500 uIU/mL Final   No results found for: VITAMINB12, FOLATE, FERRITIN, TIBC, IRON, RETICCTPCT  Echo: pending  ECG:  HR 68  Sinus rhythm PAC Abnormal R-wave progression, early transition Nonspecific ST depression Early repol in anterior leads.  Radiology:  Dg Chest 2 View  08/03/2015  CLINICAL DATA:  Acute seizure today. EXAM: CHEST  2 VIEW COMPARISON:  None. FINDINGS: The cardiomediastinal silhouette is unremarkable. There is no evidence of focal airspace disease, pulmonary edema, suspicious pulmonary nodule/mass, pleural effusion, or pneumothorax. No acute bony abnormalities are identified. IMPRESSION: No active  cardiopulmonary disease. Electronically Signed   By: Harmon PierJeffrey  Hu M.D.   On: 08/03/2015 16:40    ASSESSMENT AND PLAN:    Principal Problem:   Seizures (HCC)  New onset atrial fib/flutter- review of tele reveals intermittent atrial flutter with variable block and some afib/flutter with RVR. This is completely asymptomatic.  -- Patient has no past cardiac history; however he does not follow with a PCP. He denies a history of HTN, DM, previous CVA or CHF. However, review of his BPs while admitted have been mildly elevated. He has  also had some hyperglycemia noted in labwork. TSH is normal.  -- I will order a 2D ECHO and HgA1c for further risk stratification in terms of long term anticoagulation. However, his CHADSVASC score is relatively low (0 to 1-2) and I believe the risks of bleed are much greater than that of stroke. The patient seizes and falls very frequently despite adherence to 3 anti seizure medications.  -- He does have some RVR on review of tele.   SignedJanetta Hora, PA-C 08/04/2015 7:06 AM  Pager [phone]  Co-Sign MD  History and all data above reviewed.  Patient examined.  I agree with the findings as above.  Very pleasant patient with PAF.  The patient exam reveals COR:RRR  ,  Lungs: Clear  ,  Abd: Positive bowel sounds, no rebound no guarding, Ext No edema  .  All available labs, radiology testing, previous records reviewed. Agree with documented assessment and plan. PAF:  Not a candidate for anticoagulation with frequent seizures with trauma.  Low embolic risk.  Agree with starting Cardizem.  Echo pending but this can be done as an outpatient if he is discharged.    Fayrene Fearing Angelamarie Avakian  12:14 PM  08/04/2015

## 2015-08-08 ENCOUNTER — Ambulatory Visit: Payer: Medicaid Other | Admitting: Neurology

## 2015-08-09 ENCOUNTER — Encounter: Payer: Self-pay | Admitting: Neurology

## 2015-08-11 ENCOUNTER — Ambulatory Visit: Payer: Medicaid Other | Attending: Family Medicine | Admitting: Family Medicine

## 2015-08-11 ENCOUNTER — Encounter: Payer: Self-pay | Admitting: Family Medicine

## 2015-08-11 VITALS — BP 158/106 | HR 73 | Temp 98.4°F | Resp 18 | Ht 73.0 in | Wt 254.0 lb

## 2015-08-11 DIAGNOSIS — Z79899 Other long term (current) drug therapy: Secondary | ICD-10-CM | POA: Diagnosis not present

## 2015-08-11 DIAGNOSIS — T23131A Burn of first degree of multiple right fingers (nail), not including thumb, initial encounter: Secondary | ICD-10-CM | POA: Diagnosis not present

## 2015-08-11 DIAGNOSIS — X088XXA Exposure to other specified smoke, fire and flames, initial encounter: Secondary | ICD-10-CM | POA: Insufficient documentation

## 2015-08-11 DIAGNOSIS — F70 Mild intellectual disabilities: Secondary | ICD-10-CM | POA: Diagnosis not present

## 2015-08-11 DIAGNOSIS — T23101A Burn of first degree of right hand, unspecified site, initial encounter: Secondary | ICD-10-CM | POA: Diagnosis not present

## 2015-08-11 DIAGNOSIS — R569 Unspecified convulsions: Secondary | ICD-10-CM | POA: Insufficient documentation

## 2015-08-11 DIAGNOSIS — I10 Essential (primary) hypertension: Secondary | ICD-10-CM | POA: Insufficient documentation

## 2015-08-11 DIAGNOSIS — I480 Paroxysmal atrial fibrillation: Secondary | ICD-10-CM | POA: Diagnosis not present

## 2015-08-11 DIAGNOSIS — T23151A Burn of first degree of right palm, initial encounter: Secondary | ICD-10-CM | POA: Diagnosis not present

## 2015-08-11 DIAGNOSIS — T23109A Burn of first degree of unspecified hand, unspecified site, initial encounter: Secondary | ICD-10-CM | POA: Insufficient documentation

## 2015-08-11 DIAGNOSIS — I4891 Unspecified atrial fibrillation: Secondary | ICD-10-CM | POA: Insufficient documentation

## 2015-08-11 MED ORDER — SILVER SULFADIAZINE 1 % EX CREA: TOPICAL_CREAM | Freq: Once | CUTANEOUS | Status: DC

## 2015-08-11 MED ORDER — DILTIAZEM HCL ER COATED BEADS 180 MG PO CP24: 180.0000 mg | ORAL_CAPSULE | Freq: Every day | ORAL | Status: DC

## 2015-08-11 MED ORDER — SILVER SULFADIAZINE 1 % EX CREA: 1.0000 "application " | TOPICAL_CREAM | Freq: Every day | CUTANEOUS | Status: DC
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
## 2015-08-11 NOTE — Patient Instructions (Signed)
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
## 2015-08-21 ENCOUNTER — Ambulatory Visit (INDEPENDENT_AMBULATORY_CARE_PROVIDER_SITE_OTHER): Payer: Medicaid Other | Admitting: Neurology

## 2015-08-21 ENCOUNTER — Encounter (INDEPENDENT_AMBULATORY_CARE_PROVIDER_SITE_OTHER): Payer: Self-pay

## 2015-08-21 ENCOUNTER — Encounter: Payer: Self-pay | Admitting: Neurology

## 2015-08-21 VITALS — BP 140/81 | HR 80 | Ht 73.0 in | Wt 255.0 lb

## 2015-08-21 DIAGNOSIS — R569 Unspecified convulsions: Secondary | ICD-10-CM | POA: Diagnosis not present

## 2015-08-21 MED ORDER — LEVETIRACETAM 1000 MG PO TABS: 1500.0000 mg | ORAL_TABLET | Freq: Two times a day (BID) | ORAL | Status: DC
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
## 2015-08-25 ENCOUNTER — Ambulatory Visit: Payer: Medicaid Other | Attending: Family Medicine | Admitting: Family Medicine

## 2015-08-25 ENCOUNTER — Encounter: Payer: Self-pay | Admitting: Family Medicine

## 2015-08-25 VITALS — BP 124/74 | HR 74 | Temp 97.8°F | Resp 18 | Ht 73.0 in | Wt 244.0 lb

## 2015-08-25 DIAGNOSIS — T23009A Burn of unspecified degree of unspecified hand, unspecified site, initial encounter: Secondary | ICD-10-CM | POA: Diagnosis not present

## 2015-08-25 DIAGNOSIS — R569 Unspecified convulsions: Secondary | ICD-10-CM | POA: Insufficient documentation

## 2015-08-25 DIAGNOSIS — E669 Obesity, unspecified: Secondary | ICD-10-CM | POA: Diagnosis not present

## 2015-08-25 DIAGNOSIS — Z6832 Body mass index (BMI) 32.0-32.9, adult: Secondary | ICD-10-CM | POA: Insufficient documentation

## 2015-08-25 DIAGNOSIS — Z79899 Other long term (current) drug therapy: Secondary | ICD-10-CM | POA: Diagnosis not present

## 2015-08-25 DIAGNOSIS — Z87891 Personal history of nicotine dependence: Secondary | ICD-10-CM | POA: Insufficient documentation

## 2015-08-25 DIAGNOSIS — I10 Essential (primary) hypertension: Secondary | ICD-10-CM | POA: Insufficient documentation

## 2015-08-25 DIAGNOSIS — X58XXXA Exposure to other specified factors, initial encounter: Secondary | ICD-10-CM | POA: Diagnosis not present

## 2015-08-25 DIAGNOSIS — G40909 Epilepsy, unspecified, not intractable, without status epilepticus: Secondary | ICD-10-CM | POA: Diagnosis present

## 2015-08-25 DIAGNOSIS — T23101A Burn of first degree of right hand, unspecified site, initial encounter: Secondary | ICD-10-CM

## 2015-08-25 DIAGNOSIS — I480 Paroxysmal atrial fibrillation: Secondary | ICD-10-CM | POA: Insufficient documentation

## 2015-08-25 NOTE — Patient Instructions (Signed)
Hypertension Hypertension, commonly called high blood pressure, is when the force of blood pumping through your arteries is too strong. Your arteries are the blood vessels that carry blood from your heart throughout your body. A blood pressure reading consists of a higher number over a lower number, such as 110/72. The higher number (systolic) is the pressure inside your arteries when your heart pumps. The lower number (diastolic) is the pressure inside your arteries when your heart relaxes. Ideally you want your blood pressure below 120/80. Hypertension forces your heart to work harder to pump blood. Your arteries may become narrow or stiff. Having untreated or uncontrolled hypertension can cause heart attack, stroke, kidney disease, and other problems. RISK FACTORS Some risk factors for high blood pressure are controllable. Others are not.  Risk factors you cannot control include:   Race. You may be at higher risk if you are African American.  Age. Risk increases with age.  Gender. Men are at higher risk than women before age 45 years. After age 65, women are at higher risk than men. Risk factors you can control include:  Not getting enough exercise or physical activity.  Being overweight.  Getting too much fat, sugar, calories, or salt in your diet.  Drinking too much alcohol. SIGNS AND SYMPTOMS Hypertension does not usually cause signs or symptoms. Extremely high blood pressure (hypertensive crisis) may cause headache, anxiety, shortness of breath, and nosebleed. DIAGNOSIS To check if you have hypertension, your health care provider will measure your blood pressure while you are seated, with your arm held at the level of your heart. It should be measured at least twice using the same arm. Certain conditions can cause a difference in blood pressure between your right and left arms. A blood pressure reading that is higher than normal on one occasion does not mean that you need treatment. If  it is not clear whether you have high blood pressure, you may be asked to return on a different day to have your blood pressure checked again. Or, you may be asked to monitor your blood pressure at home for 1 or more weeks. TREATMENT Treating high blood pressure includes making lifestyle changes and possibly taking medicine. Living a healthy lifestyle can help lower high blood pressure. You may need to change some of your habits. Lifestyle changes may include:  Following the DASH diet. This diet is high in fruits, vegetables, and whole grains. It is low in salt, red meat, and added sugars.  Keep your sodium intake below 2,300 mg per day.  Getting at least 30-45 minutes of aerobic exercise at least 4 times per week.  Losing weight if necessary.  Not smoking.  Limiting alcoholic beverages.  Learning ways to reduce stress. Your health care provider may prescribe medicine if lifestyle changes are not enough to get your blood pressure under control, and if one of the following is true:  You are 18-59 years of age and your systolic blood pressure is above 140.  You are 60 years of age or older, and your systolic blood pressure is above 150.  Your diastolic blood pressure is above 90.  You have diabetes, and your systolic blood pressure is over 140 or your diastolic blood pressure is over 90.  You have kidney disease and your blood pressure is above 140/90.  You have heart disease and your blood pressure is above 140/90. Your personal target blood pressure may vary depending on your medical conditions, your age, and other factors. HOME CARE INSTRUCTIONS    Have your blood pressure rechecked as directed by your health care provider.   Take medicines only as directed by your health care provider. Follow the directions carefully. Blood pressure medicines must be taken as prescribed. The medicine does not work as well when you skip doses. Skipping doses also puts you at risk for  problems.  Do not smoke.   Monitor your blood pressure at home as directed by your health care provider. SEEK MEDICAL CARE IF:   You think you are having a reaction to medicines taken.  You have recurrent headaches or feel dizzy.  You have swelling in your ankles.  You have trouble with your vision. SEEK IMMEDIATE MEDICAL CARE IF:  You develop a severe headache or confusion.  You have unusual weakness, numbness, or feel faint.  You have severe chest or abdominal pain.  You vomit repeatedly.  You have trouble breathing. MAKE SURE YOU:   Understand these instructions.  Will watch your condition.  Will get help right away if you are not doing well or get worse.   This information is not intended to replace advice given to you by your health care provider. Make sure you discuss any questions you have with your health care provider.   Document Released: 10/07/2005 Document Revised: 02/21/2015 Document Reviewed: 07/30/2013 Elsevier Interactive Patient Education 2016 [employer] Inc[employer]  

## 2015-08-25 NOTE — Progress Notes (Signed)
Pt's here for 2wk f/up for HTN. Pt reports feeling good. Denies pain.  Pt reports taken his meds this morning.
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
## 2015-08-29 ENCOUNTER — Telehealth: Payer: Self-pay | Admitting: General Practice

## 2015-08-29 NOTE — Telephone Encounter (Signed)
Pt.'s aunt would like to know if they are some home services that the patient could receive for his health condition (seizures).....Marland Kitchen.please follow up with his aunt.Marland Kitchen.Marland Kitchen..Marland Kitchen

## 2015-08-30 ENCOUNTER — Telehealth: Payer: Self-pay | Admitting: Clinical

## 2015-08-30 NOTE — Telephone Encounter (Signed)
Returning patients call, found that Seth Brock wants referral for PCS. Seth Brock informed, and understands, that [facility] will be sent to [facility], and that he should expect a call from [facility] in the next few days.

## 2015-09-01 ENCOUNTER — Telehealth: Payer: Self-pay

## 2015-09-01 NOTE — Telephone Encounter (Signed)
CMA called patient's, Aunt [name]. Ms. Hyman HopesWebb is on patients contact list. She wanted to know if we have an assistant program that could help her nephew, seeing that he as seizures. I told patient that I would consult with Dr. Venetia NightAmao and give her call back.

## 2015-09-05 ENCOUNTER — Telehealth: Payer: Self-pay | Admitting: General Practice

## 2015-09-05 NOTE — Telephone Encounter (Signed)
Patient's Aunt called Stating that the patient had a seizure and is needing advice from the nurse.  Patient had a seizure, Patients Aunt said the he is fine and stable now. Patient's Celine Ahrunt is requesting a home health aid as she is vision impaired. Please follow up with patient

## 2015-09-06 NOTE — Telephone Encounter (Signed)
Routed message to Elpidio EricHeather Woods, RN.

## 2015-09-07 ENCOUNTER — Telehealth: Payer: Self-pay

## 2015-09-07 NOTE — Telephone Encounter (Signed)
Nurse called patient, patient verified date of birth. Patient reports having a seizure yesterday. Patient gave telephone to aunt.  Per aunt, patient is having uncontrollable seizures, 2-3 a week.  Aunt explains seizures come at random, sometimes 1 a day, sometimes 2 a day. Patient was taking a shower yesterday morning, had a seizure and fell out in tub.  Patient's aunt agrees to call 911 if patient has seizure and hits head or has injury. Nurse gave patients aunt, telephone number to call nurse directly, if needed [phone]. Patients aunt aware of nurse sending Dr. Venetia NightAmao message.

## 2015-09-08 NOTE — Telephone Encounter (Signed)
Please inform him to call his neurologist. Thank you.

## 2015-09-19 ENCOUNTER — Telehealth: Payer: Self-pay | Admitting: Neurology

## 2015-09-19 NOTE — Telephone Encounter (Signed)
Spoke to [place] - he has had two breakthrough seizures (no serious injuries) - denies missing any medications.  An earlier appt has been scheduled for him.  Instructed him to call us back with any further seizure activity.

## 2015-09-19 NOTE — Telephone Encounter (Signed)
Nurse called patient, patient verified date of birth. Patient reports having seizure last week.  Patient reports feeling good currently.  Patient agrees to call neurologist to make neurologist aware of frequency and severity of seizures.  Patient voices understanding and has no further questions at this time.

## 2015-09-19 NOTE — Telephone Encounter (Signed)
Pt called sts he had seizure 09/18/15 lasting about 3 minutes, jerking, making a noise and eyes batting and rolling. He then became incoherent and tired. She sts he had a seizure a few weeks ago and fell in bathroom hitting his head in the bathtub. His aunt was with him and sts he does not remember the seizure. Pt sts he was [facility] by [facility] (Dr Jaclyn ShaggyEnobong Amao is his provider) to call neurologist when he has seizure for documentation.

## 2015-10-02 ENCOUNTER — Ambulatory Visit (INDEPENDENT_AMBULATORY_CARE_PROVIDER_SITE_OTHER): Payer: Medicaid Other | Admitting: Neurology

## 2015-10-02 ENCOUNTER — Encounter: Payer: Self-pay | Admitting: Neurology

## 2015-10-02 VITALS — BP 124/72 | HR 64 | Ht 73.0 in | Wt 252.0 lb

## 2015-10-02 DIAGNOSIS — I480 Paroxysmal atrial fibrillation: Secondary | ICD-10-CM

## 2015-10-02 DIAGNOSIS — R569 Unspecified convulsions: Secondary | ICD-10-CM | POA: Diagnosis not present

## 2015-10-02 MED ORDER — DIVALPROEX SODIUM ER 500 MG PO TB24: ORAL_TABLET | ORAL | Status: DC

## 2015-10-02 MED ORDER — LACOSAMIDE 150 MG PO TABS: 1.0000 | ORAL_TABLET | Freq: Two times a day (BID) | ORAL | Status: DC
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
## 2015-10-02 NOTE — Patient Instructions (Signed)
Vagus Nerve Stimulation Device Placement Vagus nerve stimulation (VNS) device placement is surgery to put a VNS device in your chest. This device is also called a pulse generator or an implant. It is put in the left side of your chest. It sends mild electrical pulses to your vagus nerve. The vagus nerve runs from your chest, through your neck, to the lowerpart of your brain. The nervecontrols some of your involuntary body functions, such as heart rate. You may have this surgery if you have a seizure disorder like epilepsy or if you have depression that cannot be controlled with medicine. LET [facility] PROVIDER KNOW ABOUT:  Any allergies you have.  All medicines you are taking, including vitamins, herbs, eye drops, creams, and over-the-counter medicines.  Previous problems you or members of your family have had with the use of anesthetics.  Any blood disorders you have.  Previous surgeries you have had.  Any medical conditions you have.  Whether you are pregnant or may be pregnant.  Any history you have of heart arrhythmia, lung disease, stomach ulcers, fainting, hoarseness, schizophrenia, or bipolar disorder.  Any thoughts you have had about suicide.  Any other type of brain stimulation you receive. RISKS AND COMPLICATIONS Generally, this is a safe procedure. However, problems may occur, including:  Infection.  Bleeding.  Damage to other nerves in the neck or chest.  Pain in the chest or neck area. BEFORE THE PROCEDURE  Ask your health care provider about:  Changing or stopping your regular medicines. This is especially important if you are taking diabetes medicines or blood thinners.  Taking medicines such as aspirin and ibuprofen. These medicines can thin your blood. Do not take these medicines before your procedure if your health care provider instructs you not to.  Follow instructions from your health care provider about eating or drinking restrictions.  Plan  to have someone take you home after the procedure. PROCEDURE  Your health care team will wash or sanitize their hands.  Your skin will be washed with soap.  An IV tube will be inserted into one of your veins.  You will be given a medicine to make you fall asleep (general anesthetic).  Your surgeon will make a small surgical cut (incision) in the upper part of your chest wall, on the left side.  Your surgeon will make a pocket under your skin for the device, which is about the size of a [place] silver dollar coin.  Your surgeon will make another small incision in the lower part of your neck, on the left side.  Your surgeon will locate your vagus nerve through the neck incision.  Your surgeon will wind a wire around the nerve.  Your surgeon will pass this wire under the skin of your neck down to the chest incision. It then will be connected to the device.  The incisions will be closed with stitches (sutures) or staples and covered with a bandage (dressing). The procedure may vary among health care providers and hospitals. AFTER THE PROCEDURE  You will be taken to a recovery room.  Your blood pressure, heart rate, breathing rate, and blood oxygen level will be monitored often until the medicines you were given have worn off.   This information is not intended to replace advice given to you by your health care provider. Make sure you discuss any questions you have with your health care provider.   Document Released: 01/22/2011 Document Revised: 06/28/2015 Document Reviewed: 02/22/2015 Elsevier Interactive Patient Education 2016  [employer].

## 2015-11-03 ENCOUNTER — Telehealth: Payer: Self-pay | Admitting: Family Medicine

## 2015-11-03 NOTE — Telephone Encounter (Signed)
Phillis KnackMargaret Burnett from [facility] called requesting to speak to nurse regarding the pt. The Pt. Needs a FL2 form, because the caregiver is having trouble taking care of the pt. Please f/u.  Caregiver- [name] [phone]

## 2015-11-06 ENCOUNTER — Encounter: Payer: Self-pay | Admitting: Clinical

## 2015-11-06 ENCOUNTER — Telehealth: Payer: Self-pay | Admitting: General Practice

## 2015-11-06 NOTE — Progress Notes (Signed)
[facility] FL[facility] [phone], recommending Mr. Stovall to SNF/Extended Care, at request of caregiver [name]

## 2015-11-06 NOTE — Telephone Encounter (Signed)
Phillis KnackMargaret Burnett from [facility] called requesting to speak to nurse regarding the pt. The Pt. Needs a FL2 form, because the caregiver is having trouble taking care of the pt. Please f/u. Patient had a seizure yesterday 11/05/15 and Saturday 11/04/15.  Caregiver- [name] [phone]

## 2015-11-06 NOTE — Telephone Encounter (Signed)
Spoke with caregiver [name] and let her know that Luisa HartJamie McManness,social worker, is working on this.  Gave her Ms. McManness' number if she had questions.

## 2015-11-08 ENCOUNTER — Ambulatory Visit (INDEPENDENT_AMBULATORY_CARE_PROVIDER_SITE_OTHER): Payer: Medicaid Other | Admitting: Neurology

## 2015-11-08 DIAGNOSIS — R569 Unspecified convulsions: Secondary | ICD-10-CM | POA: Diagnosis not present

## 2015-11-08 DIAGNOSIS — I480 Paroxysmal atrial fibrillation: Secondary | ICD-10-CM

## 2015-11-08 NOTE — Procedures (Signed)
   HISTORY: 55 years old male, with frequent seizure activity, mental retardation  TECHNIQUE:  16 channel EEG was performed based on standard 10-16 international system. One channel was dedicated to EKG, which has demonstrates normal sinus rhythm of 72 beats per minutes.  Upon awakening, the posterior background activity was mildly dysarrhythmic, in the theta range 7 Hz, reactive to eye opening and closure.  There was evidence of bilateral frontal slower, with higher amplitude, dysarrhythmic slower 5 Hz activity, intermixed with frequent F8 spike slow waves  Photic stimulation was performed, which induced a symmetric photic driving, with increased appearance of F8  spike slow waves  Hyperventilation was performed, there was continued evidence of mild background slowing, increased right frontal frequency of bifrontal spike and slow waves, with phase reversal at F8  No sleep was achieved.  CONCLUSION: This is an abnormal EEG.  There is evidence of mild background slowing, consistent with his mental retardation, there is also evidence of right frontal area focal irritation, he is at high risk for recurrent seizure.

## 2015-11-09 ENCOUNTER — Other Ambulatory Visit: Payer: Self-pay | Admitting: *Deleted

## 2015-11-09 ENCOUNTER — Telehealth: Payer: Self-pay | Admitting: Neurology

## 2015-11-09 ENCOUNTER — Telehealth: Payer: Self-pay | Admitting: *Deleted

## 2015-11-09 MED ORDER — LACOSAMIDE 150 MG PO TABS: ORAL_TABLET | ORAL | Status: DC

## 2015-11-09 MED ORDER — LACOSAMIDE 150 MG PO TABS: 1.0000 | ORAL_TABLET | Freq: Two times a day (BID) | ORAL | Status: DC

## 2015-11-09 NOTE — Telephone Encounter (Signed)
Felicia with [employer] is calling regarding a fax that was just sent to them for the patient.  She needs to verify directions for Lacosamide (VIMPAT) 150 MG TABS. Thank you.

## 2015-11-09 NOTE — Telephone Encounter (Signed)
Rx for Vimpat faxed and confirmed to [facility] at [phone], per pt request.

## 2015-11-09 NOTE — Telephone Encounter (Signed)
Spoke to [facility] to confimed patient's Vimpat dosage.

## 2015-11-09 NOTE — Telephone Encounter (Signed)
I have called patient and his family, EEG was abnormal, he has increased chance of having seizure,  His aunt reported to seizure this week, I have increased his Vimpat from 150/300 mg to 150 mg 2 tablets twice a day, keep current dose of Depakote ER 500 mg in the morning/2 tablets every night  This is an abnormal EEG. There is evidence of mild background slowing, consistent with his mental retardation, there is also evidence of right frontal area focal irritation, he is at high risk for recurrent seizure.

## 2015-11-23 ENCOUNTER — Ambulatory Visit: Payer: Medicaid Other | Admitting: Family Medicine

## 2015-11-24 ENCOUNTER — Telehealth: Payer: Self-pay

## 2015-11-24 ENCOUNTER — Encounter: Payer: Self-pay | Admitting: Family Medicine

## 2015-11-24 ENCOUNTER — Telehealth: Payer: Self-pay | Admitting: Clinical

## 2015-11-24 ENCOUNTER — Ambulatory Visit: Payer: Medicaid Other | Attending: Family Medicine | Admitting: Family Medicine

## 2015-11-24 ENCOUNTER — Ambulatory Visit: Payer: Medicaid Other | Admitting: Family Medicine

## 2015-11-24 VITALS — BP 157/77 | HR 60 | Temp 97.9°F | Resp 16 | Ht 73.0 in | Wt 256.0 lb

## 2015-11-24 DIAGNOSIS — F79 Unspecified intellectual disabilities: Secondary | ICD-10-CM | POA: Diagnosis not present

## 2015-11-24 DIAGNOSIS — I10 Essential (primary) hypertension: Secondary | ICD-10-CM | POA: Insufficient documentation

## 2015-11-24 DIAGNOSIS — I480 Paroxysmal atrial fibrillation: Secondary | ICD-10-CM | POA: Insufficient documentation

## 2015-11-24 DIAGNOSIS — F70 Mild intellectual disabilities: Secondary | ICD-10-CM | POA: Insufficient documentation

## 2015-11-24 DIAGNOSIS — Z79899 Other long term (current) drug therapy: Secondary | ICD-10-CM | POA: Diagnosis not present

## 2015-11-24 DIAGNOSIS — R569 Unspecified convulsions: Secondary | ICD-10-CM | POA: Insufficient documentation

## 2015-11-24 MED ORDER — DILTIAZEM HCL ER COATED BEADS 180 MG PO CP24: 180.0000 mg | ORAL_CAPSULE | Freq: Every day | ORAL | Status: DC

## 2015-11-24 NOTE — Addendum Note (Signed)
Addended by: Jaclyn Shaggy on: 11/24/2015 04:50 PM   Modules accepted: Orders, SmartSet

## 2015-11-24 NOTE — Progress Notes (Signed)
Subjective:  Patient ID: Seth Brock, male    DOB: August 01, 1961  Age: 55 y.o. MRN: [id number]  CC: Hypertension   HPI Seth Brock is a 55 year old male with a history of hypertension, paroxysmal A. fib (not on anticoagulation due to high risk for falls), seizure who comes into the clinic for follow-up visit.  His blood pressure is elevated today and he states he has been out of his Diltiazem-  he never went to the pharmacy to pick up. He has been compliant with his antiseizure medications but continues to have recurrent seizures which are grand mal with resulting falls outside or in the bathroom and his major caregiver is his aunt who would like him to be placed in a long-term facility as she is unable to cope with his care and the patient is in agreement. He recently had an EEG by [facility] neurology which revealed evidence of mild background slowing consistent with mental retardation, evidence of right frontal area of focal irritation, patient at high risk for recurrent seizures.   Outpatient Prescriptions Prior to Visit  Medication Sig Dispense Refill  . divalproex (DEPAKOTE ER) 500 MG 24 hr tablet One tab po qam then 2 tabs po qhs 90 tablet 11  . Lacosamide (VIMPAT) 150 MG TABS Two tablets twice daily. 120 tablet 11  . levETIRAcetam (KEPPRA) 1000 MG tablet Take 2 tablets (2,000 mg total) by mouth 2 (two) times daily. Will start in November 2016 .  [facility]anging from [facility] to [facility]. 90 tablet 11  . diltiazem (CARDIZEM CD) 180 MG 24 hr capsule Take 1 capsule (180 mg total) by mouth daily. 30 capsule 2  . silver sulfADIAZINE (SILVADENE) 1 % cream Apply 1 application topically daily. (Patient not taking: Reported on 11/24/2015) 50 g 0   Facility-Administered Medications Prior to Visit  Medication Dose Route Frequency Provider Last Rate Last Dose  . silver sulfADIAZINE (SILVADENE) 1 % cream   Topical Once Jaclyn Shaggy, MD        ROS Review of Systems  Constitutional: Negative for activity  change and appetite change.  HENT: Negative for sinus pressure and sore throat.   Eyes: Negative for visual disturbance.  Respiratory: Negative for cough, chest tightness and shortness of breath.   Cardiovascular: Negative for chest pain and leg swelling.  Gastrointestinal: Negative for abdominal pain, diarrhea, constipation and abdominal distention.  Endocrine: Negative.   Genitourinary: Negative for dysuria.  Musculoskeletal: Negative for myalgias and joint swelling.  Skin: Negative for rash.  Allergic/Immunologic: Negative.   Neurological: Positive for seizures. Negative for weakness, light-headedness and numbness.  Psychiatric/Behavioral: Negative for suicidal ideas and dysphoric mood.    Objective:  BP 157/77 mmHg  Pulse 60  Temp(Src) 97.9 F (36.6 C) (Oral)  Resp 16  Ht  (1.854 m)  Wt 256 lb (116.121 kg)  BMI 33.78 kg/m2  SpO2 99%  BP/Weight 11/24/2015 10/02/2015 08/25/2015  Systolic BP 157 124 124  Diastolic BP 77 72 74  Wt. (Lbs) 256 252 244  BMI 33.78 33.25 32.2     Physical Exam  Constitutional: He is oriented to person, place, and time. He appears well-developed and well-nourished.  Cardiovascular: Normal rate, normal heart sounds and intact distal pulses.   No murmur heard. Pulmonary/Chest: Effort normal and breath sounds normal. He has no wheezes. He has no rales. He exhibits no tenderness.  Abdominal: Soft. Bowel sounds are normal. He exhibits no distension and no mass. There is no tenderness.  Musculoskeletal: Normal range of motion.  Neurological:  He is alert and oriented to person, place, and time.     Assessment & Plan:   1. Essential hypertension Uncontrolled due to being off antihypertensives which i have refilled today. Will reassess BP at next visit  - diltiazem (CARDIZEM CD) 180 MG 24 hr capsule; Take 1 capsule (180 mg total) by mouth daily.  Dispense: 30 capsule; Refill: 2 - COMPLETE METABOLIC PANEL WITH GFR - Lipid panel  2. Paroxysmal  atrial fibrillation (HCC) Currently in sinus rhythm Not on anticoagulation due to high risk for falls  3. Seizures (HCC) At high risk for recurrent seizures as per Neuro We are working on placement for him as caregiver is unable to cope; LCSW called in to see patient to facilitate process. Keep appointment with [facility] Neurology - Valproic acid level  4. Mental retardation Mild Stable  Meds ordered this encounter  Medications  . diltiazem (CARDIZEM CD) 180 MG 24 hr capsule    Sig: Take 1 capsule (180 mg total) by mouth daily.    Dispense:  30 capsule    Refill:  2    Discontinue 120 mg    Follow-up: Return in about 1 month (around 12/22/2015) for follow up on hypertension.   Jaclyn Shaggy MD

## 2015-11-24 NOTE — Telephone Encounter (Signed)
F/U with patient; informed both Seth Brock, and his Aunt Seth Brock, that a list of assisted living facilities and family homes will be sent to home address, with facilities verbally stating that they will take new Medicaid patients, highlighted in blue. It is recommended that Seth Brock and Seth Brock look over the available facilities, call [facility] at [phone], if they need more direction in help finding a facility, and to contact me, Seth Brock, at [phone] to let me know where they would like referral (FL2) sent for placement. Seth Brock and Seth Brock thankful for the information, state they understand, and Seth Brock says she will contact [facility] asap with their top 3 choices of assisted living and/or family home facilities.

## 2015-11-24 NOTE — Telephone Encounter (Signed)
CMA called patient, patient verified name and DOB. Aunt wasn't available to take my call. CMA called cell number on file. I spoke with the patient and he stated that for lab draw on Monday, between 12-1 would work for him. I told patient that I would call back before closing of the business day to follow-up with his Aunt so she would be aware too.  Tried home number on file in an attempt to reach patient's Aunt. Patient home number is disconnected.

## 2015-11-24 NOTE — Patient Instructions (Signed)
Hypertension Hypertension, commonly called high blood pressure, is when the force of blood pumping through your arteries is too strong. Your arteries are the blood vessels that carry blood from your heart throughout your body. A blood pressure reading consists of a higher number over a lower number, such as 110/72. The higher number (systolic) is the pressure inside your arteries when your heart pumps. The lower number (diastolic) is the pressure inside your arteries when your heart relaxes. Ideally you want your blood pressure below 120/80. Hypertension forces your heart to work harder to pump blood. Your arteries may become narrow or stiff. Having untreated or uncontrolled hypertension can cause heart attack, stroke, kidney disease, and other problems. RISK FACTORS Some risk factors for high blood pressure are controllable. Others are not.  Risk factors you cannot control include:   Race. You may be at higher risk if you are African American.  Age. Risk increases with age.  Gender. Men are at higher risk than women before age 45 years. After age 65, women are at higher risk than men. Risk factors you can control include:  Not getting enough exercise or physical activity.  Being overweight.  Getting too much fat, sugar, calories, or salt in your diet.  Drinking too much alcohol. SIGNS AND SYMPTOMS Hypertension does not usually cause signs or symptoms. Extremely high blood pressure (hypertensive crisis) may cause headache, anxiety, shortness of breath, and nosebleed. DIAGNOSIS To check if you have hypertension, your health care provider will measure your blood pressure while you are seated, with your arm held at the level of your heart. It should be measured at least twice using the same arm. Certain conditions can cause a difference in blood pressure between your right and left arms. A blood pressure reading that is higher than normal on one occasion does not mean that you need treatment. If  it is not clear whether you have high blood pressure, you may be asked to return on a different day to have your blood pressure checked again. Or, you may be asked to monitor your blood pressure at home for 1 or more weeks. TREATMENT Treating high blood pressure includes making lifestyle changes and possibly taking medicine. Living a healthy lifestyle can help lower high blood pressure. You may need to change some of your habits. Lifestyle changes may include:  Following the DASH diet. This diet is high in fruits, vegetables, and whole grains. It is low in salt, red meat, and added sugars.  Keep your sodium intake below 2,300 mg per day.  Getting at least 30-45 minutes of aerobic exercise at least 4 times per week.  Losing weight if necessary.  Not smoking.  Limiting alcoholic beverages.  Learning ways to reduce stress. Your health care provider may prescribe medicine if lifestyle changes are not enough to get your blood pressure under control, and if one of the following is true:  You are 18-59 years of age and your systolic blood pressure is above 140.  You are 60 years of age or older, and your systolic blood pressure is above 150.  Your diastolic blood pressure is above 90.  You have diabetes, and your systolic blood pressure is over 140 or your diastolic blood pressure is over 90.  You have kidney disease and your blood pressure is above 140/90.  You have heart disease and your blood pressure is above 140/90. Your personal target blood pressure may vary depending on your medical conditions, your age, and other factors. HOME CARE INSTRUCTIONS    Have your blood pressure rechecked as directed by your health care provider.   Take medicines only as directed by your health care provider. Follow the directions carefully. Blood pressure medicines must be taken as prescribed. The medicine does not work as well when you skip doses. Skipping doses also puts you at risk for  problems.  Do not smoke.   Monitor your blood pressure at home as directed by your health care provider. SEEK MEDICAL CARE IF:   You think you are having a reaction to medicines taken.  You have recurrent headaches or feel dizzy.  You have swelling in your ankles.  You have trouble with your vision. SEEK IMMEDIATE MEDICAL CARE IF:  You develop a severe headache or confusion.  You have unusual weakness, numbness, or feel faint.  You have severe chest or abdominal pain.  You vomit repeatedly.  You have trouble breathing. MAKE SURE YOU:   Understand these instructions.  Will watch your condition.  Will get help right away if you are not doing well or get worse.   This information is not intended to replace advice given to you by your health care provider. Make sure you discuss any questions you have with your health care provider.   Document Released: 10/07/2005 Document Revised: 02/21/2015 Document Reviewed: 07/30/2013 Elsevier Interactive Patient Education 2016 [employer] Inc[employer]  

## 2015-11-24 NOTE — Progress Notes (Signed)
Patient's here for f/up HTN. Patient states he's not having pain today.  Patient has not receive med diltiazem  Patient decline flu vaccine, and A1c screening.

## 2015-11-27 ENCOUNTER — Ambulatory Visit: Payer: Medicaid Other | Attending: Internal Medicine

## 2015-11-27 DIAGNOSIS — R569 Unspecified convulsions: Secondary | ICD-10-CM

## 2015-11-27 DIAGNOSIS — I10 Essential (primary) hypertension: Secondary | ICD-10-CM

## 2015-11-27 LAB — COMPLETE METABOLIC PANEL WITH GFR
ALBUMIN: 4 g/dL (ref 3.6–5.1)
ALK PHOS: 54 U/L (ref 40–115)
ALT: 20 U/L (ref 9–46)
AST: 24 U/L (ref 10–35)
BILIRUBIN TOTAL: 0.5 mg/dL (ref 0.2–1.2)
BUN: 10 mg/dL (ref 7–25)
CALCIUM: 9.9 mg/dL (ref 8.6–10.3)
CO2: 28 mmol/L (ref 20–31)
CREATININE: 0.79 mg/dL (ref 0.70–1.33)
Chloride: 103 mmol/L (ref 98–110)
GFR, Est Non African American: >89 mL/min (ref >=60)
Glucose, Bld: 129 mg/dL — ABNORMAL HIGH (ref 65–99)
Potassium: 4.5 mmol/L (ref 3.5–5.3)
Sodium: 138 mmol/L (ref 135–146)
TOTAL PROTEIN: 7.9 g/dL (ref 6.1–8.1)

## 2015-11-27 LAB — LIPID PANEL
Cholesterol: 173 mg/dL (ref 125–200)
HDL: 17 mg/dL — ABNORMAL LOW (ref >=40)
LDL CALC: 97 mg/dL (ref <130)
TRIGLYCERIDES: 295 mg/dL — AB (ref <150)
Total CHOL/HDL Ratio: 10.2 Ratio — ABNORMAL HIGH (ref <=5.0)
VLDL: 59 mg/dL — ABNORMAL HIGH (ref <30)

## 2015-11-28 LAB — VALPROIC ACID LEVEL: VALPROIC ACID LVL: 71.1 ug/mL (ref 50.0–100.0)

## 2015-11-28 NOTE — Telephone Encounter (Signed)
Spoke with patient Aunt, and she verified patient name and DOB. Aunt was called on Friday, 3rd about labs on Monday, 6th. Patient Aunt verified understanding of appt time with no further questions.

## 2015-11-30 ENCOUNTER — Telehealth: Payer: Self-pay | Admitting: *Deleted

## 2015-11-30 NOTE — Telephone Encounter (Signed)
-----   Message from Jaclyn Shaggy, MD sent at 11/28/2015  8:48 AM EST ----- Normal Depakote level, mildly elevated triglycerides for which he'll need him to work on low-cholesterol diet and take over-the-counter omega-3 capsules

## 2015-12-01 NOTE — Telephone Encounter (Signed)
Spoke to patient and verified name and date of birth and gave results.  Patient verbalized understanding.

## 2016-01-02 ENCOUNTER — Encounter: Payer: Self-pay | Admitting: Neurology

## 2016-01-02 ENCOUNTER — Ambulatory Visit (INDEPENDENT_AMBULATORY_CARE_PROVIDER_SITE_OTHER): Payer: Medicaid Other | Admitting: Neurology

## 2016-01-02 VITALS — BP 157/82 | HR 80 | Ht 73.0 in | Wt 254.0 lb

## 2016-01-02 DIAGNOSIS — F79 Unspecified intellectual disabilities: Secondary | ICD-10-CM

## 2016-01-02 DIAGNOSIS — I480 Paroxysmal atrial fibrillation: Secondary | ICD-10-CM | POA: Diagnosis not present

## 2016-01-02 DIAGNOSIS — R569 Unspecified convulsions: Secondary | ICD-10-CM | POA: Diagnosis not present

## 2016-01-02 MED ORDER — LEVETIRACETAM 1000 MG PO TABS: 2000.0000 mg | ORAL_TABLET | Freq: Two times a day (BID) | ORAL | Status: DC

## 2016-01-02 MED ORDER — LACOSAMIDE 150 MG PO TABS: ORAL_TABLET | ORAL | Status: DC

## 2016-01-02 MED ORDER — DIVALPROEX SODIUM ER 500 MG PO TB24: ORAL_TABLET | ORAL | Status: DC
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
## 2016-01-02 NOTE — Patient Instructions (Signed)
You should let you aunt Seth JacobsonHelen to help with medications  Document seizure event on the calendar, so we know the frequency of the seizure  I also sent home with vagal nerve stimulation therapy brochure, manufacture [employer] supporting team will contact her at [phone]

## 2016-01-15 ENCOUNTER — Encounter (HOSPITAL_COMMUNITY): Payer: Self-pay | Admitting: *Deleted

## 2016-01-15 ENCOUNTER — Emergency Department (HOSPITAL_COMMUNITY)
Admission: EM | Admit: 2016-01-15 | Discharge: 2016-01-15 | Disposition: A | Discharge status: Home / Self Care | Payer: Medicaid Other | Attending: Emergency Medicine | Admitting: Emergency Medicine

## 2016-01-15 DIAGNOSIS — Z87891 Personal history of nicotine dependence: Secondary | ICD-10-CM | POA: Diagnosis not present

## 2016-01-15 DIAGNOSIS — R109 Unspecified abdominal pain: Secondary | ICD-10-CM | POA: Diagnosis present

## 2016-01-15 DIAGNOSIS — Z79899 Other long term (current) drug therapy: Secondary | ICD-10-CM | POA: Diagnosis not present

## 2016-01-15 DIAGNOSIS — E860 Dehydration: Secondary | ICD-10-CM | POA: Diagnosis not present

## 2016-01-15 DIAGNOSIS — K5900 Constipation, unspecified: Secondary | ICD-10-CM | POA: Insufficient documentation

## 2016-01-15 DIAGNOSIS — G40909 Epilepsy, unspecified, not intractable, without status epilepticus: Secondary | ICD-10-CM | POA: Insufficient documentation

## 2016-01-15 LAB — LIPASE, BLOOD: Lipase: 78 U/L — ABNORMAL HIGH (ref 11–51)

## 2016-01-15 LAB — CBC
HCT: 38.8 % — ABNORMAL LOW (ref 39.0–52.0)
Hemoglobin: 12.7 g/dL — ABNORMAL LOW (ref 13.0–17.0)
MCH: 23.3 pg — ABNORMAL LOW (ref 26.0–34.0)
MCHC: 32.7 g/dL (ref 30.0–36.0)
MCV: 71.2 fL — AB (ref 78.0–100.0)
Platelets: 196 10*3/uL (ref 150–400)
RBC: 5.45 MIL/uL (ref 4.22–5.81)
RDW: 15.8 % — AB (ref 11.5–15.5)
WBC: 9.3 10*3/uL (ref 4.0–10.5)

## 2016-01-15 LAB — URINALYSIS, ROUTINE W REFLEX MICROSCOPIC
Bilirubin Urine: NEGATIVE
Glucose, UA: NEGATIVE mg/dL
Hgb urine dipstick: NEGATIVE
Ketones, ur: 15 mg/dL — AB
Leukocytes, UA: NEGATIVE
Nitrite: NEGATIVE
Protein, ur: 30 mg/dL — AB
Specific Gravity, Urine: 1.01 (ref 1.005–1.030)
pH: 6 (ref 5.0–8.0)

## 2016-01-15 LAB — COMPREHENSIVE METABOLIC PANEL
ALBUMIN: 3.7 g/dL (ref 3.5–5.0)
ALT: 18 U/L (ref 17–63)
AST: 26 U/L (ref 15–41)
Alkaline Phosphatase: 56 U/L (ref 38–126)
Anion gap: 12 (ref 5–15)
BUN: 9 mg/dL (ref 6–20)
CHLORIDE: 102 mmol/L (ref 101–111)
CO2: 21 mmol/L — ABNORMAL LOW (ref 22–32)
Calcium: 10.1 mg/dL (ref 8.9–10.3)
Creatinine, Ser: 1.46 mg/dL — ABNORMAL HIGH (ref 0.61–1.24)
GFR calc Af Amer: >60 mL/min (ref >60)
GFR, EST NON AFRICAN AMERICAN: 54 mL/min — AB (ref >60)
Glucose, Bld: 152 mg/dL — ABNORMAL HIGH (ref 65–99)
POTASSIUM: 4.4 mmol/L (ref 3.5–5.1)
Sodium: 135 mmol/L (ref 135–145)
Total Bilirubin: 0.7 mg/dL (ref 0.3–1.2)
Total Protein: 8.3 g/dL — ABNORMAL HIGH (ref 6.5–8.1)

## 2016-01-15 LAB — URINE MICROSCOPIC-ADD ON: RBC / HPF: NONE SEEN RBC/hpf (ref 0–5)

## 2016-01-15 MED ORDER — POLYETHYLENE GLYCOL 3350 17 GM/SCOOP PO POWD: ORAL | Status: DC

## 2016-01-15 NOTE — ED Provider Notes (Signed)
CSN: [id number]     Arrival date & time 01/15/16  [id number] History   First MD Initiated Contact with Patient 01/15/16 0957     Chief Complaint  Patient presents with  . Abdominal Pain  . Emesis     (Consider location/radiation/quality/duration/timing/severity/associated sxs/prior Treatment) Patient is a 55 y.o. male presenting with abdominal pain and vomiting. The history is provided by the patient.  Abdominal Pain Pain location:  R flank Pain quality: aching   Pain radiates to:  Does not radiate Pain severity:  Moderate Onset quality:  Gradual Duration:  4 days Timing:  Constant Progression:  Unchanged Chronicity:  New Context: eating (has aching cramps on right side)   Relieved by:  Nothing Worsened by:  Nothing tried Ineffective treatments:  None tried Associated symptoms: constipation (last Bm 4 days ago) and vomiting   Emesis Associated symptoms: abdominal pain     Past Medical History  Diagnosis Date  . Epilepsy seizure, nonconvulsive, generalized [facility](HCC)    Past Surgical History  Procedure Laterality Date  . None listed     History reviewed. No pertinent family history. Social History  Substance Use Topics  . Smoking status: Former Games developermoker  . Smokeless tobacco: Never Used  . Alcohol Use: No     Comment: patient quit in 1993     Review of Systems  Gastrointestinal: Positive for vomiting, abdominal pain and constipation (last Bm 4 days ago).  All other systems reviewed and are negative.     Allergies  Review of patient's allergies indicates no known allergies.  Home Medications   Prior to Admission medications   Medication Sig Start Date End Date Taking? Authorizing Provider  diltiazem (CARDIZEM CD) 180 MG 24 hr capsule Take 1 capsule (180 mg total) by mouth daily. 11/24/15  Yes Jaclyn ShaggyEnobong Amao, MD  divalproex (DEPAKOTE ER) 500 MG 24 hr tablet 2 tabs po twice a day 01/02/16  Yes Levert FeinsteinYijun Yan, MD  Lacosamide (VIMPAT) 150 MG TABS Two tablets twice daily. 01/02/16   Yes Levert FeinsteinYijun Yan, MD  levETIRAcetam (KEPPRA) 1000 MG tablet Take 2 tablets (2,000 mg total) by mouth 2 (two) times daily. 01/02/16  Yes Levert FeinsteinYijun Yan, MD   BP 106/78 mmHg  Pulse 82  Temp(Src) 98.2 F (36.8 C) (Oral)  Resp 18  SpO2 98% Physical Exam  Constitutional: He is oriented to person, place, and time. He appears well-developed and well-nourished. No distress.  HENT:  Head: Normocephalic and atraumatic.  Eyes: Conjunctivae are normal.  Neck: Neck supple. No tracheal deviation present.  Cardiovascular: Normal rate, regular rhythm and normal heart sounds.   Pulmonary/Chest: Effort normal and breath sounds normal. No respiratory distress.  Abdominal: Soft. He exhibits no distension. There is no tenderness. There is no rebound and no guarding.  Neurological: He is alert and oriented to person, place, and time.  Skin: Skin is warm and dry.  Psychiatric: He has a normal mood and affect.  Vitals reviewed.   ED Course  Procedures (including critical care time) Labs Review Labs Reviewed  LIPASE, BLOOD - Abnormal; Notable for the following:    Lipase 78 (*)    All other components within normal limits  COMPREHENSIVE METABOLIC PANEL - Abnormal; Notable for the following:    CO2 21 (*)    Glucose, Bld 152 (*)    Creatinine, Ser 1.46 (*)    Total Protein 8.3 (*)    GFR calc non Af Amer 54 (*)    All other components within normal limits  CBC -  Abnormal; Notable for the following:    Hemoglobin 12.7 (*)    HCT 38.8 (*)    MCV 71.2 (*)    MCH 23.3 (*)    RDW 15.8 (*)    All other components within normal limits  URINALYSIS, ROUTINE W REFLEX MICROSCOPIC (NOT AT [facility]) - Abnormal; Notable for the following:    APPearance HAZY (*)    Ketones, ur 15 (*)    Protein, ur 30 (*)    All other components within normal limits  URINE MICROSCOPIC-ADD ON - Abnormal; Notable for the following:    Squamous Epithelial / LPF 0-5 (*)    Bacteria, UA RARE (*)    Casts HYALINE CASTS (*)    All other  components within normal limits    Imaging Review No results found. I have personally reviewed and evaluated these images and lab results as part of my medical decision-making.   EKG Interpretation None      MDM   Final diagnoses:  Moderate dehydration  Constipation, unspecified constipation type   55 y.o. male presents with right side abdominal cramping and pain after eating, loss of appetite, a few espisodes of vomiting. He states last BM was 4 days ago. Able to tolerate PO here without vomiting, reassuring exam, appears clinically dehyrdated with come increase in creatinine but able to adequately orally hydrate. I suspect the patient is constipated based on symptoms, mild elevation in lipase is nondiagnostic and this would be atypical for pancreatitis.   I recommended continued oral rehydration, monitoring of symptoms, miralax to help with constipation, and close follow up for recheck kidney function/lipase this week and to re-evaluate his symptoms either with PCP or in the ED. No signs of bowel obstruction currently without persistent vomiting and Pt having flatus. No significant VS abnormality with variable rate of asymptomatic afib which he is known to have, treated conservatively as an OP. Plan to follow up with PCP as needed and return precautions discussed for worsening or new concerning symptoms.     Lyndal Pulley, MD 01/16/16 [phone]

## 2016-01-15 NOTE — Discharge Instructions (Signed)

## 2016-01-15 NOTE — ED Notes (Signed)
Gave pt water, per Chrislyn - RN.

## 2016-01-15 NOTE — ED Notes (Signed)
Pt received water, per Dr. Clydene PughKnott.

## 2016-01-15 NOTE — ED Notes (Signed)
Pt reports onset Saturday of generalized abd pain and lack of appetite, denies diarrhea. Had episode of vomiting.

## 2016-01-17 ENCOUNTER — Ambulatory Visit: Payer: Medicaid Other | Admitting: Family Medicine

## 2016-01-23 ENCOUNTER — Telehealth: Payer: Self-pay | Admitting: Family Medicine

## 2016-01-26 ENCOUNTER — Ambulatory Visit: Payer: Medicaid Other | Admitting: Family Medicine

## 2016-01-26 ENCOUNTER — Ambulatory Visit: Payer: Medicaid Other | Attending: Family Medicine | Admitting: Family Medicine

## 2016-01-26 ENCOUNTER — Encounter: Payer: Self-pay | Admitting: Family Medicine

## 2016-01-26 VITALS — BP 128/83 | HR 81 | Temp 98.1°F | Resp 18 | Ht 73.0 in | Wt 259.0 lb

## 2016-01-26 DIAGNOSIS — N179 Acute kidney failure, unspecified: Secondary | ICD-10-CM | POA: Diagnosis not present

## 2016-01-26 DIAGNOSIS — K5909 Other constipation: Secondary | ICD-10-CM | POA: Diagnosis not present

## 2016-01-26 DIAGNOSIS — K5900 Constipation, unspecified: Secondary | ICD-10-CM | POA: Insufficient documentation

## 2016-01-26 DIAGNOSIS — I480 Paroxysmal atrial fibrillation: Secondary | ICD-10-CM | POA: Diagnosis not present

## 2016-01-26 DIAGNOSIS — R569 Unspecified convulsions: Secondary | ICD-10-CM | POA: Diagnosis present

## 2016-01-26 DIAGNOSIS — Z79899 Other long term (current) drug therapy: Secondary | ICD-10-CM | POA: Insufficient documentation

## 2016-01-26 DIAGNOSIS — I10 Essential (primary) hypertension: Secondary | ICD-10-CM | POA: Insufficient documentation

## 2016-01-26 NOTE — Progress Notes (Signed)
Patient is here for Seizures  Patient states his last seizure activity was last week.  Patient denies pain at this time.  Patient states he has taken medications today and patient has eaten.

## 2016-01-26 NOTE — Progress Notes (Signed)
Subjective:  Patient ID : Seth Brock, male    DOB: 1960-11-24  Age: 55 y.o. MRN: [id number]  CC: Seizures   HPI Nayib Remer is a 55 year old male with a history of seizures, hypertension, atrial fibrillation who presents after an ED where he was managed for constipation and poor oral intake and was found to have acute kidney injury secondary to dehydration with an elevated creatinine of 1.46 up from baseline of 0.6. He was prescribed MiraLAX with resulting improvement in symptoms.  The patient has no complaints today and has been compliant with his medication. His last seizure was last week. Seizures and managed by neurology and he also sees cardiology for atrial fibrillation.   Outpatient Prescriptions Prior to Visit  Medication Sig Dispense Refill  . diltiazem (CARDIZEM CD) 180 MG 24 hr capsule Take 1 capsule (180 mg total) by mouth daily. 30 capsule 2  . divalproex (DEPAKOTE ER) 500 MG 24 hr tablet 2 tabs po twice a day 120 tablet 11  . Lacosamide (VIMPAT) 150 MG TABS Two tablets twice daily. 120 tablet 11  . levETIRAcetam (KEPPRA) 1000 MG tablet Take 2 tablets (2,000 mg total) by mouth 2 (two) times daily. 120 tablet 11  . polyethylene glycol powder (MIRALAX) powder TAKE 6 CAPFULS OF MIRALAX IN A 32 OUNCE GATORADE AND DRINK THE WHOLE BEVERAGE FOLLOWED BY 3 CAPFULS TWICE A DAY FOR THE NEXT WEEK AND FOLLOW UP WITH YOUR PRIMARY CARE PHYSICIAN. 500 g 0   No facility-administered medications prior to visit.    ROS Review of Systems  Constitutional: Negative for activity change and appetite change.  HENT: Negative for sinus pressure and sore throat.   Respiratory: Negative for chest tightness, shortness of breath and wheezing.   Cardiovascular: Negative for chest pain and palpitations.  Gastrointestinal: Negative for abdominal pain, constipation and abdominal distention.  Genitourinary: Negative.   Musculoskeletal: Negative.   Neurological: Positive for seizures.    Psychiatric/Behavioral: Negative for behavioral problems and dysphoric mood.    Objective:  BP 128/83 mmHg  Pulse 81  Temp(Src) 98.1 F (36.7 C) (Oral)  Resp 18  Ht  (1.854 m)  Wt 259 lb (117.482 kg)  BMI 34.18 kg/m2  SpO2 100%  BP/Weight 01/26/2016 01/15/2016 01/02/2016  Systolic BP 128 127 157  Diastolic BP 83 84 82  Wt. (Lbs) 259 - 254  BMI 34.18 - 33.52      Physical Exam  Constitutional: He is oriented to person, place, and time. He appears well-developed and well-nourished.  Cardiovascular: Normal rate, normal heart sounds and intact distal pulses.  An irregularly irregular rhythm present.  No murmur heard. Pulmonary/Chest: Effort normal and breath sounds normal. He has no wheezes. He has no rales. He exhibits no tenderness.  Abdominal: Soft. Bowel sounds are normal. He exhibits no distension and no mass. There is no tenderness.  Musculoskeletal: Normal range of motion.  Neurological: He is alert and oriented to person, place, and time.   BMET    Component Value Date/Time   NA 135 01/15/2016 0947   NA 138 10/19/2014 0929   K 4.4 01/15/2016 0947   CL 102 01/15/2016 0947   CO2 21* 01/15/2016 0947   GLUCOSE 152* 01/15/2016 0947   GLUCOSE 104* 10/19/2014 0929   BUN 9 01/15/2016 0947   BUN 11 10/19/2014 0929   CREATININE 1.46* 01/15/2016 0947   CREATININE 0.79 11/27/2015 0916   CALCIUM 10.1 01/15/2016 0947   GFRNONAA 54* 01/15/2016 0947   GFRNONAA >89 11/27/2015 4098  GFRAA >60 01/15/2016 0947   GFRAA >89 11/27/2015 0916      Assessment & Plan:   1. Essential hypertension Controlled Continue antihypertensives  2. Paroxysmal atrial fibrillation (HCC) Rate control with Cardizem Not on anticoagulants due to high risk of fall from seizures   3. Other constipation Improved  4. Acute kidney injury (HCC) - Basic Metabolic Panel   No orders of the defined types were placed in this encounter.    Follow-up: Return in about 3 months (around  04/26/2016) for follow up on seizures.   Jaclyn ShaggyEnobong Amao MD

## 2016-01-26 NOTE — Addendum Note (Signed)
Addended by: Jaclyn ShaggyAMAO, Paisley Grajeda on: 01/26/2016 03:21 PM   Modules accepted: Orders

## 2016-01-29 ENCOUNTER — Telehealth: Payer: Self-pay | Admitting: Family Medicine

## 2016-01-29 NOTE — Telephone Encounter (Signed)
Patient's family member called and wanted to inform his PCP that he has another seizure today....please advised

## 2016-01-30 ENCOUNTER — Other Ambulatory Visit: Payer: Self-pay | Admitting: Family Medicine

## 2016-01-30 ENCOUNTER — Ambulatory Visit: Payer: Medicaid Other | Attending: Family Medicine

## 2016-01-30 DIAGNOSIS — I10 Essential (primary) hypertension: Secondary | ICD-10-CM

## 2016-01-31 LAB — BASIC METABOLIC PANEL
BUN: 10 mg/dL (ref 7–25)
CO2: 24 mmol/L (ref 20–31)
Calcium: 9.7 mg/dL (ref 8.6–10.3)
Chloride: 102 mmol/L (ref 98–110)
Creat: 0.94 mg/dL (ref 0.70–1.33)
GLUCOSE: 117 mg/dL — AB (ref 65–99)
POTASSIUM: 4.8 mmol/L (ref 3.5–5.3)
SODIUM: 137 mmol/L (ref 135–146)

## 2016-01-31 NOTE — Telephone Encounter (Signed)
This Case Manager informed Dr. Venetia NightAmao that patient's family called and reported patient had a seizure on 01/29/16. Dr. Venetia NightAmao indicated patient should call and inform his neurologist. This Case Manager placed call to patient to provide update. Call placed to #[phone]; number no longer in service. In addition, call placed to #[phone]; unable to reach patient and no voicemail to leave message requesting return call.

## 2016-02-01 ENCOUNTER — Telehealth: Payer: Self-pay

## 2016-02-01 NOTE — Telephone Encounter (Signed)
Spoke with patient Aunt [name]. She verified pt name and DOB. Patient's Aunt was given lab results with no further questions.

## 2016-02-01 NOTE — Telephone Encounter (Signed)
-----   Message from Jaclyn ShaggyEnobong Amao, MD sent at 02/01/2016  8:17 AM EDT ----- Please inform the patient that labs are normal. Thank you.

## 2016-02-01 NOTE — Telephone Encounter (Signed)
This Case Manager placed an additional call to patient to inform him that per Dr. Venetia NightAmao he should call and inform his neurologist of seizure on 01/29/16. Call placed once again to #[phone]; number no longer in service. Also, placed an additional call to #[phone]; however, unable to reach patient as number not accepting incoming calls.

## 2016-02-13 ENCOUNTER — Telehealth: Payer: Self-pay | Admitting: Clinical

## 2016-02-13 NOTE — Telephone Encounter (Signed)
Attempt to f/u with pt and his aunt about placement for pt, no voicemail set up, no message left.   Pts Aunt Seth Brock is working with [employer] to find placement for [name]. Both [facility] and [name] have been informed that [facility] will be leaving [employer] on Friday, 02-16-16, and that either of them may call Asher MuirJamie at [phone] when they have decided upon a preferred home placement for [name], so that an FL2 form may be sent to that agency, prior to that time.

## 2016-02-15 ENCOUNTER — Ambulatory Visit: Payer: Medicaid Other | Admitting: Neurology

## 2016-02-15 ENCOUNTER — Other Ambulatory Visit: Payer: Self-pay | Admitting: Neurosurgery

## 2016-02-26 NOTE — Pre-Procedure Instructions (Signed)
    Seth BurkeLionel Brock   Your procedure is scheduled on  Tuesday, May 16.  [facility].               Your surgery or procedure is scheduled for 7:30 AM   Call this number if you have problems the morning of surgery:[phone]                    For any other questions, please call [phone], Monday - Friday 8 AM - 4 PM.   Remember:  Do not eat food or drink liquids after midnight Monday, May 15.  Take these medicines the morning of surgery with A SIP OF WATER:diltiazem (CARDIZEM CD), divalproex (DEPAKOTE ER),  levETIRAcetam (KEPPRA),  Lacosamide (VIMPAT) .   Do not wear jewelry, make-up or nail polish.  Do not wear lotions, powders, or perfumes.   Do not shave 48 hours prior to surgery.    Do not bring valuables to the hospital.  [facility] is not responsible for any belongings or valuables.  Contacts, dentures or bridgework may not be worn into surgery.  Leave your suitcase in the car.  After surgery it may be brought to your room.  For patients admitted to the hospital, discharge time will be determined by your treatment team.  Patients discharged the day of surgery will not be allowed to drive home.   Name and phone number of your driver:   -  Please read over the following fact sheets that you were given. [place]- Preparing For [facility]

## 2016-02-27 ENCOUNTER — Encounter (HOSPITAL_COMMUNITY): Payer: Self-pay

## 2016-02-27 ENCOUNTER — Encounter (HOSPITAL_COMMUNITY)
Admission: RE | Admit: 2016-02-27 | Discharge: 2016-02-27 | Disposition: A | Discharge status: Home / Self Care | Payer: Medicaid Other | Source: Ambulatory Visit | Attending: Neurosurgery | Admitting: Neurosurgery

## 2016-02-27 DIAGNOSIS — G40419 Other generalized epilepsy and epileptic syndromes, intractable, without status epilepticus: Secondary | ICD-10-CM | POA: Insufficient documentation

## 2016-02-27 DIAGNOSIS — Z01812 Encounter for preprocedural laboratory examination: Secondary | ICD-10-CM | POA: Diagnosis not present

## 2016-02-27 HISTORY — DX: Essential (primary) hypertension: I10

## 2016-02-27 HISTORY — DX: Constipation, unspecified: K59.00

## 2016-02-27 HISTORY — DX: Unspecified convulsions: R56.9

## 2016-02-27 LAB — BASIC METABOLIC PANEL
ANION GAP: 11 (ref 5–15)
BUN: 11 mg/dL (ref 6–20)
CALCIUM: 9.8 mg/dL (ref 8.9–10.3)
CO2: 19 mmol/L — AB (ref 22–32)
Chloride: 107 mmol/L (ref 101–111)
Creatinine, Ser: 0.84 mg/dL (ref 0.61–1.24)
GFR calc Af Amer: >60 mL/min (ref >60)
GFR calc non Af Amer: >60 mL/min (ref >60)
GLUCOSE: 103 mg/dL — AB (ref 65–99)
Potassium: 5.3 mmol/L — ABNORMAL HIGH (ref 3.5–5.1)
Sodium: 137 mmol/L (ref 135–145)

## 2016-02-27 LAB — CBC
HEMATOCRIT: 36.5 % — AB (ref 39.0–52.0)
HEMOGLOBIN: 11.4 g/dL — AB (ref 13.0–17.0)
MCH: 22.9 pg — ABNORMAL LOW (ref 26.0–34.0)
MCHC: 31.2 g/dL (ref 30.0–36.0)
MCV: 73.3 fL — ABNORMAL LOW (ref 78.0–100.0)
Platelets: 229 10*3/uL (ref 150–400)
RBC: 4.98 MIL/uL (ref 4.22–5.81)
RDW: 16.8 % — ABNORMAL HIGH (ref 11.5–15.5)
WBC: 3.4 10*3/uL — ABNORMAL LOW (ref 4.0–10.5)

## 2016-02-27 NOTE — Progress Notes (Signed)
   02/27/16 1320  OBSTRUCTIVE SLEEP APNEA  Have you ever been diagnosed with sleep apnea through a sleep study? No  Do you snore loudly (loud enough to be heard through closed doors)?  0  Do you often feel tired, fatigued, or sleepy during the daytime (such as falling asleep during driving or talking to someone)? 0  Has anyone observed you stop breathing during your sleep? 0  Do you have, or are you being treated for high blood pressure? 1  BMI more than 35 kg/m2? 1  Age > 50 (1-yes) 1  Neck circumference greater than:Male 16 inches or larger, Male 17inches or larger? 1  Male Gender (Yes=1) 1  Obstructive Sleep Apnea Score 5  Score 5 or greater  Results sent to PCP

## 2016-03-05 ENCOUNTER — Encounter (HOSPITAL_COMMUNITY): Payer: Self-pay | Admitting: Certified Registered"

## 2016-03-05 ENCOUNTER — Encounter (HOSPITAL_COMMUNITY)
Admission: RE | Disposition: A | Discharge status: Home / Self Care | Payer: Self-pay | Source: Ambulatory Visit | Attending: Neurosurgery

## 2016-03-05 ENCOUNTER — Ambulatory Visit (HOSPITAL_COMMUNITY): Payer: Medicaid Other | Admitting: Certified Registered"

## 2016-03-05 ENCOUNTER — Ambulatory Visit (HOSPITAL_COMMUNITY)
Admission: RE | Admit: 2016-03-05 | Discharge: 2016-03-05 | Disposition: A | Discharge status: Home / Self Care | Payer: Medicaid Other | Source: Ambulatory Visit | Attending: Neurosurgery | Admitting: Neurosurgery

## 2016-03-05 DIAGNOSIS — K5900 Constipation, unspecified: Secondary | ICD-10-CM | POA: Insufficient documentation

## 2016-03-05 DIAGNOSIS — I10 Essential (primary) hypertension: Secondary | ICD-10-CM | POA: Insufficient documentation

## 2016-03-05 DIAGNOSIS — Z87891 Personal history of nicotine dependence: Secondary | ICD-10-CM | POA: Diagnosis not present

## 2016-03-05 DIAGNOSIS — G40419 Other generalized epilepsy and epileptic syndromes, intractable, without status epilepticus: Secondary | ICD-10-CM | POA: Insufficient documentation

## 2016-03-05 DIAGNOSIS — R6250 Unspecified lack of expected normal physiological development in childhood: Secondary | ICD-10-CM | POA: Diagnosis not present

## 2016-03-05 DIAGNOSIS — G40919 Epilepsy, unspecified, intractable, without status epilepticus: Secondary | ICD-10-CM | POA: Diagnosis present

## 2016-03-05 DIAGNOSIS — I480 Paroxysmal atrial fibrillation: Secondary | ICD-10-CM | POA: Diagnosis not present

## 2016-03-05 DIAGNOSIS — Z79899 Other long term (current) drug therapy: Secondary | ICD-10-CM | POA: Insufficient documentation

## 2016-03-05 DIAGNOSIS — Z9181 History of falling: Secondary | ICD-10-CM | POA: Diagnosis not present

## 2016-03-05 HISTORY — PX: VAGUS NERVE STIMULATOR INSERTION: SHX348

## 2016-03-05 SURGERY — VAGAL NERVE STIMULATOR IMPLANT
Anesthesia: General | Site: Neck | Laterality: Left

## 2016-03-05 MED ORDER — HEMOSTATIC AGENTS (NO CHARGE) OPTIME
TOPICAL | Status: DC | PRN
  Administered 2016-03-05: 1 via TOPICAL

## 2016-03-05 MED ORDER — PROPOFOL 10 MG/ML IV BOLUS
INTRAVENOUS | Status: AC
  Filled 2016-03-05: qty 20

## 2016-03-05 MED ORDER — ONDANSETRON HCL 4 MG/2ML IJ SOLN: 4.0000 mg | Freq: Four times a day (QID) | INTRAMUSCULAR | Status: DC | PRN

## 2016-03-05 MED ORDER — FENTANYL CITRATE (PF) 250 MCG/5ML IJ SOLN
INTRAMUSCULAR | Status: AC
  Filled 2016-03-05: qty 5

## 2016-03-05 MED ORDER — CEFAZOLIN SODIUM-DEXTROSE 2-4 GM/100ML-% IV SOLN
2.0000 g | INTRAVENOUS | Status: AC
  Administered 2016-03-05: 2 g via INTRAVENOUS
  Filled 2016-03-05: qty 100

## 2016-03-05 MED ORDER — LACTATED RINGERS IV SOLN
INTRAVENOUS | Status: DC | PRN
  Administered 2016-03-05 (×2): via INTRAVENOUS

## 2016-03-05 MED ORDER — PHENYLEPHRINE HCL 10 MG/ML IJ SOLN
INTRAMUSCULAR | Status: DC | PRN
  Administered 2016-03-05: 80 ug via INTRAVENOUS
  Administered 2016-03-05: 40 ug via INTRAVENOUS
  Administered 2016-03-05 (×5): 80 ug via INTRAVENOUS

## 2016-03-05 MED ORDER — PROPOFOL 10 MG/ML IV BOLUS
INTRAVENOUS | Status: DC | PRN
  Administered 2016-03-05: 200 mg via INTRAVENOUS

## 2016-03-05 MED ORDER — 0.9 % SODIUM CHLORIDE (POUR BTL) OPTIME
TOPICAL | Status: DC | PRN
  Administered 2016-03-05: 1000 mL

## 2016-03-05 MED ORDER — LIDOCAINE 2% (20 MG/ML) 5 ML SYRINGE
INTRAMUSCULAR | Status: AC
  Filled 2016-03-05: qty 5

## 2016-03-05 MED ORDER — HYDROMORPHONE HCL 1 MG/ML IJ SOLN
INTRAMUSCULAR | Status: AC
  Filled 2016-03-05: qty 1

## 2016-03-05 MED ORDER — SODIUM CHLORIDE 0.9 % IR SOLN
Status: DC | PRN
  Administered 2016-03-05: 08:00:00

## 2016-03-05 MED ORDER — BUPIVACAINE HCL 0.5 % IJ SOLN
INTRAMUSCULAR | Status: DC | PRN
  Administered 2016-03-05: 4 mL

## 2016-03-05 MED ORDER — FENTANYL CITRATE (PF) 100 MCG/2ML IJ SOLN
INTRAMUSCULAR | Status: DC | PRN
  Administered 2016-03-05: 100 ug via INTRAVENOUS
  Administered 2016-03-05: 50 ug via INTRAVENOUS

## 2016-03-05 MED ORDER — THROMBIN 5000 UNITS EX SOLR
CUTANEOUS | Status: DC | PRN
Start: 2016-03-05 — End: 2016-03-05
  Administered 2016-03-05 (×2): 5000 [IU] via TOPICAL

## 2016-03-05 MED ORDER — ESMOLOL HCL 100 MG/10ML IV SOLN
INTRAVENOUS | Status: DC | PRN
  Administered 2016-03-05: 20 mg via INTRAVENOUS

## 2016-03-05 MED ORDER — OXYCODONE HCL 5 MG/5ML PO SOLN: 5.0000 mg | Freq: Once | ORAL | Status: DC | PRN

## 2016-03-05 MED ORDER — ONDANSETRON HCL 4 MG/2ML IJ SOLN
INTRAMUSCULAR | Status: DC | PRN
  Administered 2016-03-05: 4 mg via INTRAVENOUS

## 2016-03-05 MED ORDER — ROCURONIUM BROMIDE 100 MG/10ML IV SOLN
INTRAVENOUS | Status: DC | PRN
  Administered 2016-03-05: 50 mg via INTRAVENOUS

## 2016-03-05 MED ORDER — ONDANSETRON HCL 4 MG/2ML IJ SOLN
INTRAMUSCULAR | Status: AC
  Filled 2016-03-05: qty 2

## 2016-03-05 MED ORDER — OXYCODONE HCL 5 MG PO TABS
5.0000 mg | ORAL_TABLET | Freq: Once | ORAL | Status: DC | PRN
Start: 2016-03-05 — End: 2016-03-05

## 2016-03-05 MED ORDER — HYDROMORPHONE HCL 1 MG/ML IJ SOLN
0.2500 mg | INTRAMUSCULAR | Status: DC | PRN
  Administered 2016-03-05 (×2): 0.5 mg via INTRAVENOUS

## 2016-03-05 MED ORDER — NEOSTIGMINE METHYLSULFATE 10 MG/10ML IV SOLN
INTRAVENOUS | Status: DC | PRN
  Administered 2016-03-05: 4 mg via INTRAVENOUS

## 2016-03-05 MED ORDER — OXYCODONE-ACETAMINOPHEN 10-325 MG PO TABS: 1.0000 | ORAL_TABLET | Freq: Four times a day (QID) | ORAL | Status: DC | PRN

## 2016-03-05 MED ORDER — LIDOCAINE-EPINEPHRINE 1 %-1:100000 IJ SOLN
INTRAMUSCULAR | Status: DC | PRN
Start: 2016-03-05 — End: 2016-03-05
  Administered 2016-03-05: 4 mL

## 2016-03-05 MED ORDER — ARTIFICIAL TEARS OP OINT
TOPICAL_OINTMENT | OPHTHALMIC | Status: AC
  Filled 2016-03-05: qty 3.5

## 2016-03-05 MED ORDER — METOPROLOL TARTRATE 5 MG/5ML IV SOLN
INTRAVENOUS | Status: DC | PRN
  Administered 2016-03-05: 2 mg via INTRAVENOUS
  Administered 2016-03-05: 1 mg via INTRAVENOUS

## 2016-03-05 MED ORDER — ESMOLOL HCL 100 MG/10ML IV SOLN
INTRAVENOUS | Status: AC
  Filled 2016-03-05: qty 10

## 2016-03-05 MED ORDER — METOPROLOL TARTRATE 5 MG/5ML IV SOLN
INTRAVENOUS | Status: AC
  Filled 2016-03-05: qty 5

## 2016-03-05 MED ORDER — LIDOCAINE HCL (CARDIAC) 20 MG/ML IV SOLN
INTRAVENOUS | Status: DC | PRN
  Administered 2016-03-05: 60 mg via INTRAVENOUS

## 2016-03-05 MED ORDER — GLYCOPYRROLATE 0.2 MG/ML IJ SOLN
INTRAMUSCULAR | Status: DC | PRN
  Administered 2016-03-05: 0.6 mg via INTRAVENOUS

## 2016-03-05 MED ORDER — ROCURONIUM BROMIDE 50 MG/5ML IV SOLN
INTRAVENOUS | Status: AC
  Filled 2016-03-05: qty 1

## 2016-03-05 MED ORDER — LABETALOL HCL 5 MG/ML IV SOLN
INTRAVENOUS | Status: AC
  Filled 2016-03-05: qty 4

## 2016-03-05 MED ORDER — MIDAZOLAM HCL 2 MG/2ML IJ SOLN
INTRAMUSCULAR | Status: AC
  Filled 2016-03-05: qty 2

## 2016-03-05 MED ORDER — LABETALOL HCL 5 MG/ML IV SOLN
5.0000 mg | Freq: Once | INTRAVENOUS | Status: AC
  Administered 2016-03-05: 5 mg via INTRAVENOUS

## 2016-03-05 SURGICAL SUPPLY — 52 items
BAG DECANTER FOR FLEXI CONT (MISCELLANEOUS) ×3 IMPLANT
BENZOIN TINCTURE PRP APPL 2/3 (GAUZE/BANDAGES/DRESSINGS) IMPLANT
BLADE CLIPPER SURG (BLADE) ×3 IMPLANT
BLADE SURG 11 STRL SS (BLADE) ×3 IMPLANT
CANISTER SUCT 3000ML PPV (MISCELLANEOUS) ×3 IMPLANT
DECANTER SPIKE VIAL GLASS SM (MISCELLANEOUS) IMPLANT
DERMABOND ADVANCED (GAUZE/BANDAGES/DRESSINGS) ×2
DERMABOND ADVANCED .7 DNX12 (GAUZE/BANDAGES/DRESSINGS) ×1 IMPLANT
DRAPE CAMERA VIDEO/LASER (DRAPES) ×3 IMPLANT
DRAPE LAPAROTOMY 100X72 PEDS (DRAPES) ×3 IMPLANT
DRAPE MICROSCOPE LEICA (MISCELLANEOUS) IMPLANT
DRAPE POUCH INSTRU U-SHP 10X18 (DRAPES) ×3 IMPLANT
DRAPE PROXIMA HALF (DRAPES) ×3 IMPLANT
DRSG OPSITE POSTOP 3X4 (GAUZE/BANDAGES/DRESSINGS) ×3 IMPLANT
DRSG OPSITE POSTOP 4X6 (GAUZE/BANDAGES/DRESSINGS) ×3 IMPLANT
DRSG TEGADERM 4X4.75 (GAUZE/BANDAGES/DRESSINGS) ×12 IMPLANT
DURAPREP 6ML APPLICATOR 50/CS (WOUND CARE) ×3 IMPLANT
ELECT COATED BLADE 2.86 ST (ELECTRODE) ×3 IMPLANT
ELECT REM PT RETURN 9FT ADLT (ELECTROSURGICAL)
ELECTRODE REM PT RTRN 9FT ADLT (ELECTROSURGICAL) IMPLANT
GAUZE SPONGE 4X4 16PLY XRAY LF (GAUZE/BANDAGES/DRESSINGS) IMPLANT
GENERATOR MODEL 106 ASPIRE (Neuro Prosthesis/Implant) ×3 IMPLANT
GLOVE BIOGEL PI IND STRL 7.0 (GLOVE) ×3 IMPLANT
GLOVE BIOGEL PI IND STRL 7.5 (GLOVE) ×2 IMPLANT
GLOVE BIOGEL PI INDICATOR 7.0 (GLOVE) ×6
GLOVE BIOGEL PI INDICATOR 7.5 (GLOVE) ×4
GLOVE ECLIPSE 7.0 STRL STRAW (GLOVE) ×3 IMPLANT
GOWN STRL REUS W/ TWL LRG LVL3 (GOWN DISPOSABLE) ×2 IMPLANT
GOWN STRL REUS W/ TWL XL LVL3 (GOWN DISPOSABLE) IMPLANT
GOWN STRL REUS W/TWL 2XL LVL3 (GOWN DISPOSABLE) IMPLANT
GOWN STRL REUS W/TWL LRG LVL3 (GOWN DISPOSABLE) ×4
GOWN STRL REUS W/TWL XL LVL3 (GOWN DISPOSABLE)
KIT BASIN OR (CUSTOM PROCEDURE TRAY) ×3 IMPLANT
KIT ROOM TURNOVER OR (KITS) ×3 IMPLANT
LEAD PERENNIAFLEX 2-3 304 (Neuro Prosthesis/Implant) ×3 IMPLANT
LIQUID BAND (GAUZE/BANDAGES/DRESSINGS) IMPLANT
LOOP VESSEL MAXI BLUE (MISCELLANEOUS) ×3 IMPLANT
LOOP VESSEL MINI RED (MISCELLANEOUS) IMPLANT
NEEDLE HYPO 25X1 1.5 SAFETY (NEEDLE) ×3 IMPLANT
NS IRRIG 1000ML POUR BTL (IV SOLUTION) ×3 IMPLANT
PACK LAMINECTOMY NEURO (CUSTOM PROCEDURE TRAY) ×3 IMPLANT
PAD ARMBOARD 7.5X6 YLW CONV (MISCELLANEOUS) ×9 IMPLANT
RUBBERBAND STERILE (MISCELLANEOUS) IMPLANT
SPONGE INTESTINAL PEANUT (DISPOSABLE) ×3 IMPLANT
SPONGE SURGIFOAM ABS GEL SZ50 (HEMOSTASIS) ×3 IMPLANT
SUT NURALON 4 0 TR CR/8 (SUTURE) ×3 IMPLANT
SUT VIC AB 3-0 SH 8-18 (SUTURE) ×6 IMPLANT
SUT VICRYL 3-0 RB1 18 ABS (SUTURE) ×9 IMPLANT
TOWEL OR 17X24 6PK STRL BLUE (TOWEL DISPOSABLE) ×3 IMPLANT
TOWEL OR 17X26 10 PK STRL BLUE (TOWEL DISPOSABLE) ×3 IMPLANT
TUNNELING TOOL (MISCELLANEOUS) ×3 IMPLANT
WATER STERILE IRR 1000ML POUR (IV SOLUTION) ×3 IMPLANT

## 2016-03-05 NOTE — Anesthesia Procedure Notes (Signed)
Procedure Name: Intubation Date/Time: 03/05/2016 7:52 AM Performed by: Lanell MatarBAKER, Saad Buhl M Pre-anesthesia Checklist: Emergency Drugs available, Timeout performed, Patient identified, Suction available and Patient being monitored Patient Re-evaluated:Patient Re-evaluated prior to inductionOxygen Delivery Method: Circle system utilized Preoxygenation: Pre-oxygenation with 100% oxygen Intubation Type: IV induction Ventilation: Mask ventilation without difficulty and Oral airway inserted - appropriate to patient size Laryngoscope Size: Mac and 3 Grade View: Grade III Tube type: Oral Tube size: 7.5 mm Number of attempts: 2 Airway Equipment and Method: Stylet Placement Confirmation: ETT inserted through vocal cords under direct vision,  positive ETCO2 and breath sounds checked- equal and bilateral Secured at: 23 cm Tube secured with: Tape Dental Injury: Teeth and Oropharynx as per pre-operative assessment  Future Recommendations: Recommend- induction with short-acting agent, and alternative techniques readily available Comments: DL with Miller 2. No view obtained. DL with MAC 3. Grade 3 view. AOI. +ETCO2, BBS=.

## 2016-03-05 NOTE — Discharge Instructions (Signed)
Ok to shower 48 hours after surgery.  Keep wound dry during day. Walk as much as possible. ° °

## 2016-03-05 NOTE — Transfer of Care (Signed)
Immediate Anesthesia Transfer of Care Note  Patient: Seth Brock  Procedure(s) Performed: Procedure(s) with comments: Left Vagal Nerve Stimulator Placement (Left) - Left Vagal Nerve Stimulator placement  Patient Location: PACU  Anesthesia Type:General  Level of Consciousness: awake, alert  and oriented  Airway & Oxygen Therapy: Patient Spontanous Breathing and Patient connected to nasal cannula oxygen  Post-op Assessment: Report given to RN, Post -op Vital signs reviewed and stable and Patient moving all extremities X 4  Post vital signs: Reviewed and stable  Last Vitals:  Filed Vitals:   03/05/16 0631 03/05/16 0643  BP: 150/79 140/79  Pulse: 70 71  Temp: 36.6 C 36.5 C  Resp: 20 20    Last Pain: There were no vitals filed for this visit.    Patients Stated Pain Goal: 3 (03/05/16 0631)  Complications: No apparent anesthesia complications

## 2016-03-05 NOTE — Anesthesia Preprocedure Evaluation (Addendum)
Anesthesia Evaluation  Patient identified by MRN, date of birth, ID band Patient awake    Reviewed: Allergy & Precautions, NPO status , Patient's Chart, lab work & pertinent test results  Airway Mallampati: II   Neck ROM: full    Dental  (+) Teeth Intact, Poor Dentition, Loose, Dental Advisory Given,    Pulmonary former smoker,    breath sounds clear to auscultation       Cardiovascular hypertension, + dysrhythmias Atrial Fibrillation  Rhythm:regular Rate:Normal  Paroxysmal AF.  Not on anti-coagulation due to high fall risk.   Neuro/Psych Seizures -,     GI/Hepatic   Endo/Other  obese  Renal/GU      Musculoskeletal   Abdominal   Peds  Hematology   Anesthesia Other Findings   Reproductive/Obstetrics                           Anesthesia Physical Anesthesia Plan  ASA: II  Anesthesia Plan: General   Post-op Pain Management:    Induction: Intravenous  Airway Management Planned: Oral ETT  Additional Equipment:   Intra-op Plan:   Post-operative Plan: Extubation in OR  Informed Consent: I have reviewed the patients History and Physical, chart, labs and discussed the procedure including the risks, benefits and alternatives for the proposed anesthesia with the patient or authorized representative who has indicated his/her understanding and acceptance.     Plan Discussed with: CRNA, Anesthesiologist and Surgeon  Anesthesia Plan Comments:         Anesthesia Quick Evaluation

## 2016-03-05 NOTE — Op Note (Signed)
PREOP DIAGNOSIS: Medically intractable epilepsy   POSTOP DIAGNOSIS: Same  PROCEDURE: 1. Placement of left Vagal nerve stimulator  SURGEON: Dr. Lisbeth RenshawNeelesh Jovi Alvizo, MD  ASSISTANT: None  ANESTHESIA: General Endotracheal  EBL: 50cc  SPECIMENS: None  DRAINS: None  COMPLICATIONS: None immediate  CONDITION: Hemodynamically stable to PACU  HISTORY: Seth BurkeLionel Brock is a 55 y.o. male who was initially seen in the outpatient clinic as a referral from the neurologist for medically intractable epilepsy. The patient appeared to be a good candidate for the procedure. The risks and benefits of the surgery were reviewed in detail. After all questions were answered, informed consent was obtained.  PROCEDURE IN DETAIL: After informed consent was obtained and witnessed, the patient was brought to the operating room. After induction of general anesthesia, the patient was positioned on the operative table in the supine position. All pressure points were meticulously padded. Skin incisions were then marked out and prepped and draped in the usual sterile fashion.  After timeout was conducted, skin incision was infiltrated with local anesthetic with epinephrine. Skin incision was then made sharply, and Bovie electrocautery was used to dissect the subcutaneous tissue. The platysma muscle was identified and incised. The platysma was then undermined. The medial border of the sternocleidomastoid muscle was identified, and dissection was carried out utilizing natural tissue planes of the neck until the carotid sheath was identified. The jugular vein was initially identified and the carotid sheath was incised. The vagus nerve was then identified running underneath the carotid. The best exposure of the nerve was actually to retract the carotid laterally. The vagus nerve was circumferentially dissected for length of a proximally 3-1/2 cm. The subclavicular incision was then made, and Bovie electrocautery was used to dissect  the subcutaneous tissue. A subcutaneous pocket was then made. A subcutaneous tunneler was then passed from the neck to the subclavicular incision. The vagal stimulator leads were then tunneled. The leads were then placed on the vagus nerve. A relaxing curl was then placed, and the leaves were tacked to the sternocleidomastoid muscle. The generator was then connected to the leads, and testing was done to confirm normal impedance, and good placement. The generator was then placed in the subcutaneous pocket. Wounds were then irrigated with copious amounts of normal saline irrigation. The platysma was closed using interrupted 3-0 Vicryl stitches. Subcutaneous layer and the subclavicular incision was closed using interrupted 3-0 Vicryl stitches. Skin was then closed using interrupted 3-0 Vicryl, and a layer of Dermabond was applied. A final check of the system was again carried out which showed the system to be functioning normally, with good lead placement, and normal impedance.  At the end of the case all sponge, needle, and instrument counts were correct. The patient was then extubated, transferred to the stretcher, and taken to the postanesthesia care unit in stable hemodynamic condition.

## 2016-03-05 NOTE — H&P (Signed)
CC:  Epilepsy  HPI: Seth Brock is a 55 year old man I'm seeing for initial consultation at the request of his primary neurologist, Dr. Terrace ArabiaYan, for medically intractable epilepsy. He has a history of developmental delay, and he and his mother report having seizures since he was about 55 years old. Currently, they report what sound like generalized seizures, at least 2-3 times a week. They feel like he is lucky because even though he passes out during the seizures, he is not sustain any significant injuries when he falls. He has tried multiple previous medications, and is currently on Depakote, Keppra, and Vimpat. He continues to have frequent seizures.   PMH: Past Medical History  Diagnosis Date  . Epilepsy seizure, nonconvulsive, generalized (HCC)   . Seizures (HCC)   . Hypertension   . Constipation     takes Miralax daily    PSH: Past Surgical History  Procedure Laterality Date  . None listed    . No past surgeries      SH: Social History  Substance Use Topics  . Smoking status: Former Games developermoker  . Smokeless tobacco: Never Used     Comment: as a teenager  . Alcohol Use: No     Comment: patient quit in 1993     MEDS: Prior to Admission medications   Medication Sig Start Date End Date Taking? Authorizing Provider  diltiazem (CARDIZEM CD) 180 MG 24 hr capsule Take 1 capsule (180 mg total) by mouth daily. 11/24/15  Yes Jaclyn ShaggyEnobong Amao, MD  divalproex (DEPAKOTE ER) 500 MG 24 hr tablet 2 tabs po twice a day Patient taking differently: Take 1,000 mg by mouth 2 (two) times daily.  01/02/16  Yes Levert FeinsteinYijun Yan, MD  Lacosamide (VIMPAT) 150 MG TABS Two tablets twice daily. Patient taking differently: Take 300 mg by mouth 2 (two) times daily.  01/02/16  Yes Levert FeinsteinYijun Yan, MD  levETIRAcetam (KEPPRA) 1000 MG tablet Take 2 tablets (2,000 mg total) by mouth 2 (two) times daily. 01/02/16  Yes Levert FeinsteinYijun Yan, MD  polyethylene glycol (MIRALAX / GLYCOLAX) packet Take 17 g by mouth daily.   Yes Historical Provider, MD     ALLERGY: No Known Allergies  ROS: ROS  NEUROLOGIC EXAM: Awake, alert, oriented Memory and concentration grossly intact Speech fluent, appropriate CN grossly intact Motor exam: Upper Extremities Deltoid Bicep Tricep Grip  Right 5/5 5/5 5/5 5/5  Left 5/5 5/5 5/5 5/5   Lower Extremity IP Quad PF DF EHL  Right 5/5 5/5 5/5 5/5 5/5  Left 5/5 5/5 5/5 5/5 5/5   Sensation grossly intact to LT   IMPRESSION: 55 year old man with continued generalized seizures at least 2-3 times a week despite 3 antiepileptic medications. He would be a good candidate for vagal nerve stimulation.  PLAN: - We will plan on proceeding with placement of a left VNS   I did review the surgical procedure with the patient and his aunt. Possible outcomes including the possibility of failure of treatment was discussed. We also discussed the risks of the surgery including cardiac and gastrointestinal symptoms, hoarseness of voice, bleeding, infection, and neck hematoma. The patient and more so his aunt who is his medical decision maker, appeared to understand our discussion. They indicated a willingness to proceed with surgery. All their questions were answered.

## 2016-03-06 ENCOUNTER — Encounter (HOSPITAL_COMMUNITY): Payer: Self-pay | Admitting: Neurosurgery

## 2016-03-06 NOTE — Anesthesia Postprocedure Evaluation (Signed)
Anesthesia Post Note  Patient: Seth BurkeLionel Brock  Procedure(s) Performed: Procedure(s) (LRB): Left Vagal Nerve Stimulator Placement (Left)  Patient location during evaluation: PACU Anesthesia Type: General Level of consciousness: awake and alert and patient cooperative Pain management: pain level controlled Vital Signs Assessment: post-procedure vital signs reviewed and stable Respiratory status: spontaneous breathing and respiratory function stable Cardiovascular status: stable Anesthetic complications: no    Last Vitals:  Filed Vitals:   03/05/16 1130 03/05/16 1141  BP: 148/88 148/88  Pulse: 93 79  Temp:  36.3 C  Resp: 25 20    Last Pain:  Filed Vitals:   03/05/16 1144  PainSc: Asleep                 Darrien Laakso S

## 2016-03-19 ENCOUNTER — Encounter: Payer: Self-pay | Admitting: Neurology

## 2016-03-19 ENCOUNTER — Ambulatory Visit (INDEPENDENT_AMBULATORY_CARE_PROVIDER_SITE_OTHER): Payer: Medicaid Other | Admitting: Neurology

## 2016-03-19 VITALS — BP 127/72 | HR 64 | Ht 72.0 in | Wt 251.0 lb

## 2016-03-19 DIAGNOSIS — R569 Unspecified convulsions: Secondary | ICD-10-CM

## 2016-03-19 DIAGNOSIS — G40119 Localization-related (focal) (partial) symptomatic epilepsy and epileptic syndromes with simple partial seizures, intractable, without status epilepticus: Secondary | ICD-10-CM | POA: Diagnosis not present

## 2016-03-19 DIAGNOSIS — F79 Unspecified intellectual disabilities: Secondary | ICD-10-CM | POA: Diagnosis not present
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
## 2016-04-02 ENCOUNTER — Other Ambulatory Visit: Payer: Self-pay | Admitting: Pharmacist

## 2016-04-02 DIAGNOSIS — I10 Essential (primary) hypertension: Secondary | ICD-10-CM

## 2016-04-02 MED ORDER — DILTIAZEM HCL ER COATED BEADS 180 MG PO CP24: 180.0000 mg | ORAL_CAPSULE | Freq: Every day | ORAL | Status: DC

## 2016-04-03 ENCOUNTER — Emergency Department (HOSPITAL_COMMUNITY)
Admission: EM | Admit: 2016-04-03 | Discharge: 2016-04-03 | Disposition: A | Discharge status: Home / Self Care | Payer: Medicaid Other | Attending: Emergency Medicine | Admitting: Emergency Medicine

## 2016-04-03 ENCOUNTER — Ambulatory Visit: Payer: Medicaid Other | Admitting: Neurology

## 2016-04-03 ENCOUNTER — Encounter (HOSPITAL_COMMUNITY): Payer: Self-pay | Admitting: Emergency Medicine

## 2016-04-03 DIAGNOSIS — S80212A Abrasion, left knee, initial encounter: Secondary | ICD-10-CM | POA: Insufficient documentation

## 2016-04-03 DIAGNOSIS — R569 Unspecified convulsions: Secondary | ICD-10-CM

## 2016-04-03 DIAGNOSIS — Z87891 Personal history of nicotine dependence: Secondary | ICD-10-CM | POA: Insufficient documentation

## 2016-04-03 DIAGNOSIS — T07XXXA Unspecified multiple injuries, initial encounter: Secondary | ICD-10-CM

## 2016-04-03 DIAGNOSIS — Y9301 Activity, walking, marching and hiking: Secondary | ICD-10-CM | POA: Diagnosis not present

## 2016-04-03 DIAGNOSIS — S80211A Abrasion, right knee, initial encounter: Secondary | ICD-10-CM | POA: Diagnosis not present

## 2016-04-03 DIAGNOSIS — S50811A Abrasion of right forearm, initial encounter: Secondary | ICD-10-CM | POA: Insufficient documentation

## 2016-04-03 DIAGNOSIS — Y929 Unspecified place or not applicable: Secondary | ICD-10-CM | POA: Insufficient documentation

## 2016-04-03 DIAGNOSIS — I10 Essential (primary) hypertension: Secondary | ICD-10-CM | POA: Diagnosis not present

## 2016-04-03 DIAGNOSIS — Y999 Unspecified external cause status: Secondary | ICD-10-CM | POA: Diagnosis not present

## 2016-04-03 DIAGNOSIS — W19XXXA Unspecified fall, initial encounter: Secondary | ICD-10-CM | POA: Diagnosis not present

## 2016-04-03 DIAGNOSIS — G40909 Epilepsy, unspecified, not intractable, without status epilepticus: Secondary | ICD-10-CM | POA: Diagnosis present

## 2016-04-03 LAB — CBC WITH DIFFERENTIAL/PLATELET
BASOS PCT: 1 %
Basophils Absolute: 0 10*3/uL (ref 0.0–0.1)
EOS ABS: 0 10*3/uL (ref 0.0–0.7)
Eosinophils Relative: 1 %
HEMATOCRIT: 35.3 % — AB (ref 39.0–52.0)
HEMOGLOBIN: 11.1 g/dL — AB (ref 13.0–17.0)
Lymphocytes Relative: 46 %
Lymphs Abs: 1.3 10*3/uL (ref 0.7–4.0)
MCH: 22.6 pg — ABNORMAL LOW (ref 26.0–34.0)
MCHC: 31.4 g/dL (ref 30.0–36.0)
MCV: 71.7 fL — ABNORMAL LOW (ref 78.0–100.0)
Monocytes Absolute: 0.2 10*3/uL (ref 0.1–1.0)
Monocytes Relative: 8 %
NEUTROS ABS: 1.2 10*3/uL — AB (ref 1.7–7.7)
NEUTROS PCT: 44 %
Platelets: 183 10*3/uL (ref 150–400)
RBC: 4.92 MIL/uL (ref 4.22–5.81)
RDW: 16.6 % — ABNORMAL HIGH (ref 11.5–15.5)
WBC: 2.8 10*3/uL — AB (ref 4.0–10.5)

## 2016-04-03 LAB — BASIC METABOLIC PANEL
ANION GAP: 5 (ref 5–15)
BUN: 15 mg/dL (ref 6–20)
CALCIUM: 9.5 mg/dL (ref 8.9–10.3)
CHLORIDE: 106 mmol/L (ref 101–111)
CO2: 25 mmol/L (ref 22–32)
CREATININE: 0.87 mg/dL (ref 0.61–1.24)
GFR calc non Af Amer: >60 mL/min (ref >60)
Glucose, Bld: 114 mg/dL — ABNORMAL HIGH (ref 65–99)
Potassium: 4.1 mmol/L (ref 3.5–5.1)
SODIUM: 136 mmol/L (ref 135–145)

## 2016-04-03 LAB — VALPROIC ACID LEVEL: VALPROIC ACID LVL: 69 ug/mL (ref 50.0–100.0)

## 2016-04-03 MED ORDER — DIVALPROEX SODIUM ER 500 MG PO TB24
1000.0000 mg | ORAL_TABLET | Freq: Two times a day (BID) | ORAL | Status: DC
  Administered 2016-04-03: 1000 mg via ORAL
  Filled 2016-04-03: qty 2

## 2016-04-03 MED ORDER — TETANUS-DIPHTH-ACELL PERTUSSIS 5-2.5-18.5 LF-MCG/0.5 IM SUSP
0.5000 mL | Freq: Once | INTRAMUSCULAR | Status: AC
Start: 2016-04-03 — End: 2016-04-03
  Administered 2016-04-03: 0.5 mL via INTRAMUSCULAR
  Filled 2016-04-03: qty 0.5

## 2016-04-03 MED ORDER — LACOSAMIDE 50 MG PO TABS
300.0000 mg | ORAL_TABLET | Freq: Two times a day (BID) | ORAL | Status: DC
  Administered 2016-04-03: 300 mg via ORAL
  Filled 2016-04-03 (×2): qty 2
  Filled 2016-04-03: qty 6

## 2016-04-03 MED ORDER — LEVETIRACETAM 750 MG PO TABS
2000.0000 mg | ORAL_TABLET | Freq: Two times a day (BID) | ORAL | Status: DC
  Administered 2016-04-03: 2000 mg via ORAL
  Filled 2016-04-03 (×2): qty 1

## 2016-04-03 MED ORDER — ACETAMINOPHEN 325 MG PO TABS
650.0000 mg | ORAL_TABLET | Freq: Once | ORAL | Status: AC
  Administered 2016-04-03: 650 mg via ORAL
  Filled 2016-04-03: qty 2

## 2016-04-03 NOTE — ED Notes (Signed)
Bed: [id number] Expected date:  Expected time:  Means of arrival:  Comments: 31M/seizure/A&Ox4

## 2016-04-03 NOTE — Discharge Instructions (Signed)
Epilepsy °Epilepsy is a disorder in which a person has repeated seizures over time. A seizure is a release of abnormal electrical activity in the brain. Seizures can cause a change in attention, behavior, or the ability to remain awake and alert (altered mental status). Seizures often involve uncontrollable shaking (convulsions).  °Most people with epilepsy lead normal lives. However, people with epilepsy are at an increased risk of falls, accidents, and injuries. Therefore, it is important to begin treatment right away. °CAUSES  °Epilepsy has many possible causes. Anything that disturbs the normal pattern of brain cell activity can lead to seizures. This may include:  °· Head injury. °· Birth trauma. °· High fever as a child. °· Stroke. °· Bleeding into or around the brain. °· Certain drugs. °· Prolonged low oxygen, such as what occurs after CPR efforts. °· Abnormal brain development. °· Certain illnesses, such as meningitis, encephalitis (brain infection), malaria, and other infections. °· An imbalance of nerve signaling chemicals (neurotransmitters).   °SIGNS AND SYMPTOMS  °The symptoms of a seizure can vary greatly from one person to another. Right before a seizure, you may have a warning (aura) that a seizure is about to occur. An aura may include the following symptoms: °· Fear or anxiety. °· Nausea. °· Feeling like the room is spinning (vertigo). °· Vision changes, such as seeing flashing lights or spots. °Common symptoms during a seizure include: °· Abnormal sensations, such as an abnormal smell or a bitter taste in the mouth.   °· Sudden, general body stiffness.   °· Convulsions that involve rhythmic jerking of the face, arm, or leg on one or both sides.   °· Sudden change in consciousness.   °¨ Appearing to be awake but not responding.   °¨ Appearing to be asleep but cannot be awakened.   °· Grimacing, chewing, lip smacking, drooling, tongue biting, or loss of bowel or bladder control. °After a seizure,  you may feel sleepy for a while.  °DIAGNOSIS  °Your health care provider will ask about your symptoms and take a medical history. Descriptions from any witnesses to your seizures will be very helpful in the diagnosis. A physical exam, including a detailed neurological exam, is necessary. Various tests may be done, such as:  °· An electroencephalogram (EEG). This is a painless test of your brain waves. In this test, a diagram is created of your brain waves. These diagrams can be interpreted by a specialist. °· An MRI of the brain.   °· A CT scan of the brain.   °· A spinal tap (lumbar puncture, LP). °· Blood tests to check for signs of infection or abnormal blood chemistry. °TREATMENT  °There is no cure for epilepsy, but it is generally treatable. Once epilepsy is diagnosed, it is important to begin treatment as soon as possible. For most people with epilepsy, seizures can be controlled with medicines. The following may also be used: °· A pacemaker for the brain (vagus nerve stimulator) can be used for people with seizures that are not well controlled by medicine. °· Surgery on the brain. °For some people, epilepsy eventually goes away. °HOME CARE INSTRUCTIONS  °· Follow your health care provider's recommendations on driving and safety in normal activities. °· Get enough rest. Lack of sleep can cause seizures. °· Only take over-the-counter or prescription medicines as directed by your health care provider. Take any prescribed medicine exactly as directed. °· Avoid any known triggers of your seizures. °· Keep a seizure diary. Record what you recall about any seizure, especially any possible trigger.   °· Make   sure the people you live and work with know that you are prone to seizures. They should receive instructions on how to help you. In general, a witness to a seizure should:   °¨ Cushion your head and body.   °¨ Turn you on your side.   °¨ Avoid unnecessarily restraining you.   °¨ Not place anything inside your  mouth.   °¨ Call for emergency medical help if there is any question about what has occurred.   °· Follow up with your health care provider as directed. You may need regular blood tests to monitor the levels of your medicine.   °SEEK MEDICAL CARE IF:  °· You develop signs of infection or other illness. This might increase the risk of a seizure.   °· You seem to be having more frequent seizures.   °· Your seizure pattern is changing.   °SEEK IMMEDIATE MEDICAL CARE IF:  °· You have a seizure that does not stop after a few moments.   °· You have a seizure that causes any difficulty in breathing.   °· You have a seizure that results in a very severe headache.   °· You have a seizure that leaves you with the inability to speak or use a part of your body.   °  °This information is not intended to replace advice given to you by your health care provider. Make sure you discuss any questions you have with your health care provider. °  °Document Released: 10/07/2005 Document Revised: 07/28/2013 Document Reviewed: 05/19/2013 °Elsevier Interactive Patient Education ©2016 [employer] ° ° °Seizure, Adult °A seizure is abnormal electrical activity in the brain. Seizures usually last from 30 seconds to 2 minutes. There are various types of seizures. °Before a seizure, you may have a warning sensation (aura) that a seizure is about to occur. An aura may include the following symptoms:  °· Fear or anxiety. °· Nausea. °· Feeling like the room is spinning (vertigo). °· Vision changes, such as seeing flashing lights or spots. °Common symptoms during a seizure include: °· A change in attention or behavior (altered mental status). °· Convulsions with rhythmic jerking movements. °· Drooling. °· Rapid eye movements. °· Grunting. °· Loss of bladder and bowel control. °· Bitter taste in the mouth. °· Tongue biting. °After a seizure, you may feel confused and sleepy. You may also have an injury resulting from convulsions during the  seizure. °HOME CARE INSTRUCTIONS  °· If you are given medicines, take them exactly as prescribed by your health care provider. °· Keep all follow-up appointments as directed by your health care provider. °· Do not swim or drive or engage in risky activity during which a seizure could cause further injury to you or others until your health care provider says it is OK. °· Get adequate rest. °· Teach friends and family what to do if you have a seizure. They should: °¨ Lay you on the ground to prevent a fall. °¨ Put a cushion under your head. °¨ Loosen any tight clothing around your neck. °¨ Turn you on your side. If vomiting occurs, this helps keep your airway clear. °¨ Stay with you until you recover. °¨ Know whether or not you need emergency care. °SEEK IMMEDIATE MEDICAL CARE IF: °· The seizure lasts longer than 5 minutes. °· The seizure is severe or you do not wake up immediately after the seizure. °· You have an altered mental status after the seizure. °· You are having more frequent or worsening seizures. °Someone should drive you to the emergency department or call local emergency   services (911 in [place] °MAKE SURE YOU: °· Understand these instructions. °· Will watch your condition. °· Will get help right away if you are not doing well or get worse. °  °This information is not intended to replace advice given to you by your health care provider. Make sure you discuss any questions you have with your health care provider. °  °Document Released: 10/04/2000 Document Revised: 10/28/2014 Document Reviewed: 05/19/2013 °Elsevier Interactive Patient Education ©2016 [employer] ° °

## 2016-04-03 NOTE — ED Notes (Signed)
Seizure pads were put in place at 07:56

## 2016-04-03 NOTE — ED Provider Notes (Addendum)
CSN: [id number]     Arrival date & time 04/03/16  0753 History   First MD Initiated Contact with Patient 04/03/16 [phone]     Chief Complaint  Patient presents with  . Seizures     (Consider location/radiation/quality/duration/timing/severity/associated sxs/prior Treatment) The history is provided by the patient.    Seth Brock is a 55 y.o. male who presents for evaluation of seizure, with known epilepsy. He states that he was walking on a sidewalk, and feels like he had a seizure. There was no prodrome. He injured his knees, and right hand in the fall that was associated with a seizure. He denies headache, neck pain, back pain, nausea, vomiting, weakness or dizziness at this time. He presents by EMS for evaluation. He recently followed up with his neurologist, and had his vagal nerve stimulator, assessed, and adjusted. No recent illnesses. He states that he is taking his medications as usual, but did not take his morning medicines yet. There are no other known modifying factors.    Past Medical History  Diagnosis Date  . Epilepsy seizure, nonconvulsive, generalized (HCC)   . Seizures (HCC)   . Hypertension   . Constipation     takes Miralax daily   Past Surgical History  Procedure Laterality Date  . None listed    . No past surgeries    . Vagus nerve stimulator insertion Left 03/05/2016    Procedure: Left Vagal Nerve Stimulator Placement;  Surgeon: Lisbeth RenshawNeelesh Nundkumar, MD;  Location: [facility];  Service: Neurosurgery;  Laterality: Left;  Left Vagal Nerve Stimulator placement   History reviewed. No pertinent family history. Social History  Substance Use Topics  . Smoking status: Former Games developermoker  . Smokeless tobacco: Never Used     Comment: as a teenager  . Alcohol Use: No     Comment: patient quit in 1993     Review of Systems  All other systems reviewed and are negative.     Allergies  Review of patient's allergies indicates no known allergies.  Home Medications    Prior to Admission medications   Medication Sig Start Date End Date Taking? Authorizing Provider  diltiazem (CARDIZEM CD) 180 MG 24 hr capsule Take 1 capsule (180 mg total) by mouth daily. 04/02/16  Yes Jaclyn ShaggyEnobong Amao, MD  divalproex (DEPAKOTE ER) 500 MG 24 hr tablet 2 tabs po twice a day Patient taking differently: Take 1,000 mg by mouth 2 (two) times daily.  01/02/16  Yes Levert FeinsteinYijun Yan, MD  Lacosamide (VIMPAT) 150 MG TABS Two tablets twice daily. Patient taking differently: Take 300 mg by mouth 2 (two) times daily.  01/02/16  Yes Levert FeinsteinYijun Yan, MD  levETIRAcetam (KEPPRA) 1000 MG tablet Take 2 tablets (2,000 mg total) by mouth 2 (two) times daily. 01/02/16  Yes Levert FeinsteinYijun Yan, MD  oxyCODONE-acetaminophen (PERCOCET) 10-325 MG tablet Take 1 tablet by mouth every 6 (six) hours as needed for pain. 03/05/16  Yes Lisbeth RenshawNeelesh Nundkumar, MD   BP 133/93 mmHg  Pulse 70  Temp(Src) 98.9 F (37.2 C) (Oral)  Resp 16  Ht 6\' 1"  (1.854 m)  Wt 260 lb (117.935 kg)  BMI 34.31 kg/m2  SpO2 100% Physical Exam  Constitutional: He is oriented to person, place, and time. He appears well-developed and well-nourished. No distress.  HENT:  Head: Normocephalic and atraumatic.  Right Ear: External ear normal.  Left Ear: External ear normal.  Eyes: Conjunctivae and EOM are normal. Pupils are equal, round, and reactive to light.  Neck: Normal range of motion and  phonation normal. Neck supple.  Cardiovascular: Normal rate, regular rhythm and normal heart sounds.   Pulmonary/Chest: Effort normal and breath sounds normal. He exhibits no bony tenderness.  Abdominal: Soft. There is no tenderness.  Musculoskeletal: Normal range of motion.  Neurological: He is alert and oriented to person, place, and time. No cranial nerve deficit or sensory deficit. He exhibits normal muscle tone. Coordination normal.  No dysarthria or aphasia.  Skin: Skin is warm, dry and intact.  Small abrasions right forearm and both anterior knees.  Psychiatric: He  has a normal mood and affect. His behavior is normal. Judgment and thought content normal.  Nursing note and vitals reviewed.   ED Course  Procedures (including critical care time)   Medications  divalproex (DEPAKOTE ER) 24 hr tablet 1,000 mg (not administered)  lacosamide (VIMPAT) tablet 300 mg (not administered)  levETIRAcetam (KEPPRA) tablet 2,000 mg (not administered)  acetaminophen (TYLENOL) tablet 650 mg (650 mg Oral Given 04/03/16 0913)  Tdap (BOOSTRIX) injection 0.5 mL (0.5 mLs Intramuscular Given 04/03/16 0913)    Patient Vitals for the past 24 hrs:  BP Temp Temp src Pulse Resp SpO2 Height Weight  04/03/16 1032 133/93 mmHg 98.9 F (37.2 C) Oral 70 16 100 % - -  04/03/16 0812 137/95 mmHg - - 85 20 98 %  (1.854 m) 260 lb (117.935 kg)    11:15 AM Reevaluation with update and discussion. After initial assessment and treatment, an updated evaluation reveals He remains alert, cooperative, comfortable. Wounds have been cleansed. His aunt is here with him now and she and he were both updated on the findings and plan. Bob Eastwood L    Labs Review Labs Reviewed  BASIC METABOLIC PANEL - Abnormal; Notable for the following:    Glucose, Bld 114 (*)    All other components within normal limits  CBC WITH DIFFERENTIAL/PLATELET - Abnormal; Notable for the following:    WBC 2.8 (*)    Hemoglobin 11.1 (*)    HCT 35.3 (*)    MCV 71.7 (*)    MCH 22.6 (*)    RDW 16.6 (*)    Neutro Abs 1.2 (*)    All other components within normal limits  VALPROIC ACID LEVEL    Imaging Review No results found. I have personally reviewed and evaluated these images and lab results as part of my medical decision-making.   EKG Interpretation None      MDM   Final diagnoses:  Seizure (HCC)  Fall, initial encounter  Abrasion, multiple sites    Seizure with known epilepsy, Depakote level, therapeutic. Unable to level his other antiepileptic medications. No evidence for respect to  infection, metabolic instability or impending vascular collapse.  Nursing Notes Reviewed/ Care Coordinated Applicable Imaging Reviewed Interpretation of Laboratory Data incorporated into ED treatment  The patient appears reasonably screened and/or stabilized for discharge and I doubt any other medical condition or other [facility] requiring further screening, evaluation, or treatment in the ED at this time prior to discharge.  Plan: Home Medications- usual; Home Treatments- rest, wound care at home; return here if the recommended treatment, does not improve the symptoms; Recommended follow up- PCP checkup one week.     Mancel Bale, MD 04/03/16 1116  Mancel Bale, MD 04/09/16 1516  Mancel Bale, MD 05/11/16 1536

## 2016-04-03 NOTE — ED Notes (Signed)
Patient here with complaints of seizure this morning while walking.  Abrasions noted to knees and arms. New vagus nerve stimulator.

## 2016-04-04 ENCOUNTER — Encounter: Payer: Self-pay | Admitting: Neurology

## 2016-04-04 ENCOUNTER — Ambulatory Visit (INDEPENDENT_AMBULATORY_CARE_PROVIDER_SITE_OTHER): Payer: Medicaid Other | Admitting: Neurology

## 2016-04-04 VITALS — BP 110/68 | HR 72 | Ht 73.0 in | Wt 247.0 lb

## 2016-04-04 DIAGNOSIS — F79 Unspecified intellectual disabilities: Secondary | ICD-10-CM | POA: Diagnosis not present

## 2016-04-04 DIAGNOSIS — R569 Unspecified convulsions: Secondary | ICD-10-CM

## 2016-04-04 DIAGNOSIS — G40119 Localization-related (focal) (partial) symptomatic epilepsy and epileptic syndromes with simple partial seizures, intractable, without status epilepticus: Secondary | ICD-10-CM

## 2016-04-04 DIAGNOSIS — I480 Paroxysmal atrial fibrillation: Secondary | ICD-10-CM
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
## 2016-04-25 ENCOUNTER — Ambulatory Visit (INDEPENDENT_AMBULATORY_CARE_PROVIDER_SITE_OTHER): Payer: Medicaid Other | Admitting: Neurology

## 2016-04-25 ENCOUNTER — Encounter: Payer: Self-pay | Admitting: Neurology

## 2016-04-25 VITALS — BP 122/81 | HR 80 | Ht 73.0 in | Wt 250.0 lb

## 2016-04-25 DIAGNOSIS — R569 Unspecified convulsions: Secondary | ICD-10-CM

## 2016-04-25 DIAGNOSIS — F79 Unspecified intellectual disabilities: Secondary | ICD-10-CM | POA: Diagnosis not present

## 2016-04-25 DIAGNOSIS — G40119 Localization-related (focal) (partial) symptomatic epilepsy and epileptic syndromes with simple partial seizures, intractable, without status epilepticus: Secondary | ICD-10-CM | POA: Diagnosis not present
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
## 2016-05-02 ENCOUNTER — Telehealth: Payer: Self-pay | Admitting: Neurology

## 2016-05-02 NOTE — Telephone Encounter (Signed)
Spoke to [place] - says Graciella BeltonLionel had one seizure this morning - he had not missed any medication - he has recovered well and is feeling better this afternoon.  Dr. Terrace ArabiaYan is aware and it is okay for him to keep his follow up appt in August.  She will call back to report any further seizure activity.

## 2016-05-02 NOTE — Telephone Encounter (Signed)
Pt's aunt [name] called sts he had a seizure this morning. sts he has been doing well. His next appt is 8/24- should he be seen sooner. Pt was with her, please call

## 2016-05-17 ENCOUNTER — Telehealth: Payer: Self-pay | Admitting: Neurology

## 2016-05-17 NOTE — Telephone Encounter (Signed)
Patient called and stated he was having problems with his Vagal Nerve Stimulator that was placed by Dr. Conchita Paris. The patient said it was not working like it was supposed to.  I gave patient Dr. Val Riles telephone number. The patient called back saying he was having problems getting through to Dr. Val Riles office. I told the patient I would call his office and have someone call him. I made a call and spoke to Dr. Val Riles secretary and she was going to call the patient and discuss.

## 2016-05-17 NOTE — Telephone Encounter (Signed)
I called the patient. The patient claims that his vagal nerve stimulator is not working properly, he is unable to verbalize exactly what it is he is talking about. The stimulator was placed about a month ago, I will have the patient, into the office early next week and I will interrogate the stimulator.

## 2016-05-20 NOTE — Telephone Encounter (Signed)
Spoke to pt and he agreed to come in tomorrow afternoon to have VNS interrogated.

## 2016-05-21 ENCOUNTER — Encounter: Payer: Self-pay | Admitting: Neurology

## 2016-05-21 ENCOUNTER — Ambulatory Visit (INDEPENDENT_AMBULATORY_CARE_PROVIDER_SITE_OTHER): Payer: Medicaid Other | Admitting: Neurology

## 2016-05-21 VITALS — BP 130/72 | HR 66 | Ht 73.0 in | Wt 246.5 lb

## 2016-05-21 DIAGNOSIS — G40119 Localization-related (focal) (partial) symptomatic epilepsy and epileptic syndromes with simple partial seizures, intractable, without status epilepticus: Secondary | ICD-10-CM | POA: Diagnosis not present

## 2016-05-21 DIAGNOSIS — R569 Unspecified convulsions: Secondary | ICD-10-CM

## 2016-05-21 NOTE — Procedures (Signed)
    History: Seth Brock is a 55 year old black male with a history of intractable seizures. He has had a vagal nerve stimulator placed on 03/05/2016. The patient is concerned that this stimulator is not working. He does not feel the cycling of the stimulations. He also reports some tenderness along the incision sites where the stimulator was placed. He comes in for an evaluation.   VAGAL NERVE STIMULATOR SETTINGS:  Output current: 0.750 milliamps increased to 1.0 mA  Signal frequency: 30 Hz  Pulse width: 500 microseconds  On time: 30 seconds  Off time: 5.0 minutes  Magnet current: 1.0 milliamps increased to 1.250 mA  Magnet on time: 60 seconds  Pulse width: 500 microseconds    Output current: 1.0 milliamps  Lead impedance: 2323 Ohms  IFI: No  The VNS unit was interrogated, and the settings were changed as above. The patient appeared to tolerate the changes well.  Marlan Palau MD 05/21/2016 5:22 PM  [name] [address] [place], [place] [address]  Phone [phone] Fax [phone]

## 2016-05-21 NOTE — Progress Notes (Signed)
Please refer to vagal nerve stimulator procedure note. 

## 2016-05-22 NOTE — Telephone Encounter (Signed)
Spoke to [employer] had a seizure today but has returned back to his baseline and is doing well this afternoon.  No missed medications.  He will keep his follow up with Dr. Terrace Arabia 06/13/16.  They will call us back to report any seizure activity.

## 2016-05-22 NOTE — Telephone Encounter (Signed)
Pt's aunt called said he had a seizure while in the grocery store about 1 hour ago. Pt was not taken to ED. Please touch base with her at [phone]

## 2016-05-31 ENCOUNTER — Other Ambulatory Visit: Payer: Self-pay | Admitting: Neurology

## 2016-05-31 ENCOUNTER — Telehealth: Payer: Self-pay | Admitting: Neurology

## 2016-05-31 NOTE — Telephone Encounter (Signed)
[facility] [place] [phone] called to request refill of Lacosamide (VIMPAT) 150 MG TABS, states patient is there now, is transported to the pharmacy by a facility. Conley Simmondsdvised Andy of March prescription for this medication. They do not have this prescription on file.

## 2016-05-31 NOTE — Telephone Encounter (Signed)
Rn call Mardelle Mattendy at [facility]. Rn stated pt had prescription that was done in March 2017. Mardelle Mattendy stated they never receive the vimpat prescription for march. They were doing refills off a January prescription. Rn stated Dr.Xu is the covering MD for the am. Rn did 30 day prescription per Dr. Roda ShuttersXu. [facility] at [phone]. Prescription was fax and receive 3 times.

## 2016-06-13 ENCOUNTER — Encounter: Payer: Self-pay | Admitting: Neurology

## 2016-06-13 ENCOUNTER — Ambulatory Visit (INDEPENDENT_AMBULATORY_CARE_PROVIDER_SITE_OTHER): Payer: Medicaid Other | Admitting: Neurology

## 2016-06-13 VITALS — BP 139/82 | HR 87 | Ht 73.0 in | Wt 242.0 lb

## 2016-06-13 DIAGNOSIS — R569 Unspecified convulsions: Secondary | ICD-10-CM | POA: Diagnosis not present

## 2016-06-13 DIAGNOSIS — F79 Unspecified intellectual disabilities: Secondary | ICD-10-CM | POA: Diagnosis not present

## 2016-06-13 DIAGNOSIS — I480 Paroxysmal atrial fibrillation: Secondary | ICD-10-CM | POA: Diagnosis not present
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
## 2016-06-17 ENCOUNTER — Ambulatory Visit: Payer: Self-pay | Admitting: Neurology

## 2016-06-24 ENCOUNTER — Telehealth: Payer: Self-pay | Admitting: Neurology

## 2016-06-24 NOTE — Telephone Encounter (Signed)
[name] webb called about patient. He had 2 seizures today ;lasted about 3 minutes patient in no distress at the moment. I advised them to go to the ED, she declined stating he has intractable epilepsy and she is just letting us know. thanks

## 2016-06-25 NOTE — Telephone Encounter (Signed)
Spoke to [place] Mills[place]en - states he is back to baseline with no concerns.  Reminded to swipe VNS when seizure is coming - stated sometimes he is unaware but will try.  He will keep his pending follow up appt.

## 2016-06-25 NOTE — Telephone Encounter (Signed)
Please talked with Seth JacobsonHelen and patient, and reminded him to swipe his vagal nerve stimulator if he notes his seizure is coming.

## 2016-07-08 ENCOUNTER — Other Ambulatory Visit: Payer: Self-pay | Admitting: Neurology

## 2016-07-09 ENCOUNTER — Other Ambulatory Visit: Payer: Self-pay | Admitting: Neurology

## 2016-08-06 ENCOUNTER — Emergency Department (HOSPITAL_COMMUNITY): Payer: Medicaid Other

## 2016-08-06 ENCOUNTER — Encounter (HOSPITAL_COMMUNITY): Payer: Self-pay | Admitting: Neurology

## 2016-08-06 ENCOUNTER — Telehealth: Payer: Self-pay | Admitting: Neurology

## 2016-08-06 ENCOUNTER — Emergency Department (HOSPITAL_COMMUNITY)
Admission: EM | Admit: 2016-08-06 | Discharge: 2016-08-06 | Disposition: A | Discharge status: Home / Self Care | Payer: Medicaid Other | Attending: Emergency Medicine | Admitting: Emergency Medicine

## 2016-08-06 DIAGNOSIS — Z79899 Other long term (current) drug therapy: Secondary | ICD-10-CM | POA: Insufficient documentation

## 2016-08-06 DIAGNOSIS — Y939 Activity, unspecified: Secondary | ICD-10-CM | POA: Diagnosis not present

## 2016-08-06 DIAGNOSIS — S62646A Nondisplaced fracture of proximal phalanx of right little finger, initial encounter for closed fracture: Secondary | ICD-10-CM

## 2016-08-06 DIAGNOSIS — Z87891 Personal history of nicotine dependence: Secondary | ICD-10-CM | POA: Insufficient documentation

## 2016-08-06 DIAGNOSIS — I10 Essential (primary) hypertension: Secondary | ICD-10-CM | POA: Insufficient documentation

## 2016-08-06 DIAGNOSIS — Y999 Unspecified external cause status: Secondary | ICD-10-CM | POA: Insufficient documentation

## 2016-08-06 DIAGNOSIS — W1830XA Fall on same level, unspecified, initial encounter: Secondary | ICD-10-CM | POA: Insufficient documentation

## 2016-08-06 DIAGNOSIS — R569 Unspecified convulsions: Secondary | ICD-10-CM

## 2016-08-06 DIAGNOSIS — Y92410 Unspecified street and highway as the place of occurrence of the external cause: Secondary | ICD-10-CM | POA: Insufficient documentation

## 2016-08-06 DIAGNOSIS — S6991XA Unspecified injury of right wrist, hand and finger(s), initial encounter: Secondary | ICD-10-CM | POA: Diagnosis present

## 2016-08-06 LAB — COMPREHENSIVE METABOLIC PANEL
ALBUMIN: 3.6 g/dL (ref 3.5–5.0)
ALT: 45 U/L (ref 17–63)
ANION GAP: 8 (ref 5–15)
AST: 61 U/L — ABNORMAL HIGH (ref 15–41)
Alkaline Phosphatase: 52 U/L (ref 38–126)
BUN: 9 mg/dL (ref 6–20)
CO2: 25 mmol/L (ref 22–32)
Calcium: 9.8 mg/dL (ref 8.9–10.3)
Chloride: 104 mmol/L (ref 101–111)
Creatinine, Ser: 0.95 mg/dL (ref 0.61–1.24)
GFR calc non Af Amer: >60 mL/min (ref >60)
GLUCOSE: 159 mg/dL — AB (ref 65–99)
POTASSIUM: 4.7 mmol/L (ref 3.5–5.1)
SODIUM: 137 mmol/L (ref 135–145)
TOTAL PROTEIN: 7.2 g/dL (ref 6.5–8.1)
Total Bilirubin: 0.7 mg/dL (ref 0.3–1.2)

## 2016-08-06 LAB — CBC WITH DIFFERENTIAL/PLATELET
BASOS PCT: 1 %
Basophils Absolute: 0 10*3/uL (ref 0.0–0.1)
EOS ABS: 0.1 10*3/uL (ref 0.0–0.7)
EOS PCT: 2 %
HCT: 35.3 % — ABNORMAL LOW (ref 39.0–52.0)
Hemoglobin: 11.3 g/dL — ABNORMAL LOW (ref 13.0–17.0)
LYMPHS ABS: 1.2 10*3/uL (ref 0.7–4.0)
Lymphocytes Relative: 37 %
MCH: 23.1 pg — AB (ref 26.0–34.0)
MCHC: 32 g/dL (ref 30.0–36.0)
MCV: 72 fL — ABNORMAL LOW (ref 78.0–100.0)
MONO ABS: 0.3 10*3/uL (ref 0.1–1.0)
MONOS PCT: 8 %
NEUTROS PCT: 52 %
Neutro Abs: 1.8 10*3/uL (ref 1.7–7.7)
Platelets: 192 10*3/uL (ref 150–400)
RBC: 4.9 MIL/uL (ref 4.22–5.81)
RDW: 16.7 % — AB (ref 11.5–15.5)
WBC: 3.4 10*3/uL — ABNORMAL LOW (ref 4.0–10.5)

## 2016-08-06 LAB — URINALYSIS, ROUTINE W REFLEX MICROSCOPIC
Bilirubin Urine: NEGATIVE
GLUCOSE, UA: NEGATIVE mg/dL
Hgb urine dipstick: NEGATIVE
KETONES UR: 15 mg/dL — AB
LEUKOCYTES UA: NEGATIVE
NITRITE: NEGATIVE
PH: 7 (ref 5.0–8.0)
PROTEIN: 30 mg/dL — AB
Specific Gravity, Urine: 1.017 (ref 1.005–1.030)

## 2016-08-06 LAB — RAPID URINE DRUG SCREEN, HOSP PERFORMED
AMPHETAMINES: NOT DETECTED
Barbiturates: NOT DETECTED
Benzodiazepines: NOT DETECTED
Cocaine: NOT DETECTED
OPIATES: NOT DETECTED
Tetrahydrocannabinol: NOT DETECTED

## 2016-08-06 LAB — URINE MICROSCOPIC-ADD ON: RBC / HPF: NONE SEEN RBC/hpf (ref 0–5)

## 2016-08-06 LAB — I-STAT TROPONIN, ED: Troponin i, poc: 0 ng/mL (ref 0.00–0.08)

## 2016-08-06 LAB — VALPROIC ACID LEVEL: VALPROIC ACID LVL: 93 ug/mL (ref 50.0–100.0)

## 2016-08-06 MED ORDER — DIVALPROEX SODIUM ER 500 MG PO TB24: ORAL_TABLET | ORAL | 11 refills | Status: DC

## 2016-08-06 MED ORDER — DIVALPROEX SODIUM ER 500 MG PO TB24: 1000.0000 mg | ORAL_TABLET | ORAL | 0 refills | Status: DC
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
## 2016-08-06 NOTE — ED Triage Notes (Signed)
Per ems- Pt was found on the side of [address] lying on the side, was witnessed to pass out and fall by someone driving by. Pt is confused, doesn't know where he is. Bystander denied any seizure activity, but pt reports has hx of seizures, is compliant with meds. CBG 161. BP 132/88, HR 98, 18 RR RA. Unable to obtain IV access.

## 2016-08-06 NOTE — Discharge Instructions (Signed)
He may take ibuprofen as prescribed over-the-counter Visine for pain relief. I also recommend resting, elevating and applying ice to her right hand for 15 minutes 3-4 times daily to help with associated pain. I recommend continuing to take your Keppra and Vimpat twice daily as you have normally been taking it at home.  Now, start taking 2 of your Depakote tablets in the morning and take 3 at night.  Follow-up with your neurologist at your scheduled appointment next week. Please return to the Emergency Department if symptoms worsen or new onset of fever, headache, lightheadedness, dizziness, visual changes, confusion, agitation, chest pain, difficult to breathing, numbness, tingling, weakness, sig B, seizure.

## 2016-08-06 NOTE — ED Notes (Signed)
Spoke to pt's aunt, [name], she reports to send him home in a cab and she will pay for the cab when it arrives to their home.

## 2016-08-06 NOTE — ED Notes (Signed)
Patient Alert and oriented X4. Stable and ambulatory. Patient verbalized understanding of the discharge instructions.  Patient belongings were taken by the patient.  

## 2016-08-06 NOTE — ED Notes (Signed)
Pt's aunt's phone # [phone]. Pt tried to call her back, but no answer.

## 2016-08-06 NOTE — Telephone Encounter (Signed)
I received a call from emergency room,  Seth Brock presented to the emergency room for recurrent seizure today on August 06 2016, he is currently taking Depakote DR 500 mg 2 tablets twice a day, Vimpat 150 mg 2 tablets twice a day, Keppra 1000 mg 2 tablets twice a day, also had vagal nerve stimulation,  Depakote level was 93,  I have advised him to increase Depakote to 2 tablets in the morning, 3 tablets at night, keep the rest medication the same, keep follow-up appointment August 15 2016

## 2016-08-08 ENCOUNTER — Telehealth: Payer: Self-pay | Admitting: Neurology

## 2016-08-08 NOTE — Telephone Encounter (Signed)
Spoke to McClureHelen - she is aware Dr. Terrace ArabiaYan spoke with the ED physician on 08/07/16.  His Depakote ER 500mg  was increased to 2 tab in am and 2 tabs in pm.  He will keep his pending appt on 08/15/16.  He is coming in to discuss his medications and his VNS.  She will call to update us on any seizure activity.

## 2016-08-08 NOTE — Telephone Encounter (Signed)
Aunt [name] called back, states call dropped. Please call [phone] or [phone].

## 2016-08-08 NOTE — Telephone Encounter (Signed)
Pt's Aunt [name] called stating pt is constantly having seizures even with stent. He was seen in the ER yesterday and sent how while still having seizures. Please call and advise [phone]

## 2016-08-15 ENCOUNTER — Ambulatory Visit (INDEPENDENT_AMBULATORY_CARE_PROVIDER_SITE_OTHER): Payer: Medicaid Other | Admitting: Neurology

## 2016-08-15 ENCOUNTER — Encounter: Payer: Self-pay | Admitting: Neurology

## 2016-08-15 ENCOUNTER — Encounter: Payer: Self-pay | Admitting: *Deleted

## 2016-08-15 VITALS — BP 146/89 | HR 70 | Ht 73.0 in | Wt 249.0 lb

## 2016-08-15 DIAGNOSIS — G40119 Localization-related (focal) (partial) symptomatic epilepsy and epileptic syndromes with simple partial seizures, intractable, without status epilepticus: Secondary | ICD-10-CM

## 2016-08-15 DIAGNOSIS — R569 Unspecified convulsions: Secondary | ICD-10-CM

## 2016-08-15 DIAGNOSIS — I480 Paroxysmal atrial fibrillation: Secondary | ICD-10-CM | POA: Diagnosis not present

## 2016-08-15 DIAGNOSIS — F79 Unspecified intellectual disabilities: Secondary | ICD-10-CM

## 2016-08-15 DIAGNOSIS — I10 Essential (primary) hypertension: Secondary | ICD-10-CM | POA: Diagnosis not present

## 2016-08-15 MED ORDER — DILTIAZEM HCL ER COATED BEADS 180 MG PO CP24: 180.0000 mg | ORAL_CAPSULE | Freq: Every day | ORAL | 4 refills | Status: DC

## 2016-08-15 MED ORDER — LEVETIRACETAM 1000 MG PO TABS
2000.0000 mg | ORAL_TABLET | Freq: Two times a day (BID) | ORAL | 4 refills | Status: DC
Start: 2016-08-15 — End: 2017-02-03

## 2016-08-15 MED ORDER — DIVALPROEX SODIUM ER 500 MG PO TB24: 1000.0000 mg | ORAL_TABLET | Freq: Two times a day (BID) | ORAL | 4 refills | Status: DC

## 2016-08-15 MED ORDER — LACOSAMIDE 150 MG PO TABS: 2.0000 | ORAL_TABLET | Freq: Two times a day (BID) | ORAL | 4 refills | Status: DC
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
## 2016-08-18 ENCOUNTER — Telehealth: Payer: Self-pay | Admitting: Neurology

## 2016-08-18 NOTE — Telephone Encounter (Signed)
Seth Brock called to report that pt just had another seizure. I called her back on the number [phone] and she was not available. I left VM for her to call back.   [name], MD PhD Stroke Neurology 08/18/2016 1:34 PM

## 2016-08-18 NOTE — Telephone Encounter (Signed)
Called pt aunt back. She stated that her phone sometimes not working. She stated that today after coming back from [place], pt was taking food out of fridge. And without warning, he fell to the ground with arm shaking and mourning and LOC for about 3-4 min. After that he woke up and looked at his aunt "what is up? Are you OK?' seemed not knowing what just happened. Then he seemed tired for about 1-2 min and then back to baseline. Currently he is sitting in chair watching TV, at his baseline.   Aunt stated that he is taking all 3 AEDs as instructed and VNS decreased seizure frequency but he still has frequent seizures. Last on was on 08/06/16 when he was sent to ED.   I told her to continue the AEDs for him and I will send a note to Dr. Terrace ArabiaYan and let her know. Aunt expressed understanding and appreciation.   Marvel PlanJindong Tyechia Allmendinger, MD PhD Stroke Neurology 08/18/2016 2:30 PM

## 2016-09-04 ENCOUNTER — Telehealth: Payer: Self-pay | Admitting: Neurology

## 2016-09-04 NOTE — Telephone Encounter (Signed)
Aunt [name] [phone] called to report that patient had seizure today while outside walking, denies hitting head.

## 2016-09-04 NOTE — Telephone Encounter (Signed)
Returned call to Cablevision SystemsHelen - states Other has returned to baseline but she wanted to report the event.  He has not missed any medication.

## 2016-09-18 ENCOUNTER — Telehealth: Payer: Self-pay | Admitting: Neurology
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
## 2016-09-18 NOTE — Telephone Encounter (Signed)
Spoke to Camanche North ShoreHelen - states she is unable to care for [name] any longer (because of his needs and her current health status/age).  She is going to contact his Child psychotherapistsocial worker for guidance on how to get him placed into the appropriate facility.  If they need any paperwork, she will have it sent to our office.

## 2016-10-03 ENCOUNTER — Ambulatory Visit (INDEPENDENT_AMBULATORY_CARE_PROVIDER_SITE_OTHER): Payer: Medicaid Other | Admitting: Neurology

## 2016-10-03 ENCOUNTER — Encounter: Payer: Self-pay | Admitting: Neurology

## 2016-10-03 VITALS — BP 138/84 | HR 86 | Ht 73.0 in | Wt 247.0 lb

## 2016-10-03 DIAGNOSIS — G40119 Localization-related (focal) (partial) symptomatic epilepsy and epileptic syndromes with simple partial seizures, intractable, without status epilepticus: Secondary | ICD-10-CM

## 2016-10-03 DIAGNOSIS — F79 Unspecified intellectual disabilities: Secondary | ICD-10-CM | POA: Diagnosis not present

## 2016-10-03 DIAGNOSIS — R569 Unspecified convulsions: Secondary | ICD-10-CM
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
## 2016-10-28 ENCOUNTER — Telehealth: Payer: Self-pay | Admitting: Neurology

## 2016-10-28 ENCOUNTER — Encounter (HOSPITAL_COMMUNITY): Payer: Self-pay

## 2016-10-28 ENCOUNTER — Emergency Department (HOSPITAL_COMMUNITY)
Admission: EM | Admit: 2016-10-28 | Discharge: 2016-10-28 | Disposition: A | Discharge status: Home / Self Care | Payer: Medicaid Other | Attending: Physician Assistant | Admitting: Physician Assistant

## 2016-10-28 DIAGNOSIS — I10 Essential (primary) hypertension: Secondary | ICD-10-CM | POA: Insufficient documentation

## 2016-10-28 DIAGNOSIS — G40909 Epilepsy, unspecified, not intractable, without status epilepticus: Secondary | ICD-10-CM | POA: Diagnosis present

## 2016-10-28 DIAGNOSIS — R569 Unspecified convulsions: Secondary | ICD-10-CM

## 2016-10-28 DIAGNOSIS — Z79899 Other long term (current) drug therapy: Secondary | ICD-10-CM | POA: Insufficient documentation

## 2016-10-28 DIAGNOSIS — Z87891 Personal history of nicotine dependence: Secondary | ICD-10-CM | POA: Diagnosis not present

## 2016-10-28 HISTORY — DX: Cardiac arrhythmia, unspecified: I49.9

## 2016-10-28 LAB — CBC WITH DIFFERENTIAL/PLATELET
BASOS ABS: 0 10*3/uL (ref 0.0–0.1)
BASOS PCT: 1 %
EOS PCT: 1 %
Eosinophils Absolute: 0 10*3/uL (ref 0.0–0.7)
HCT: 35.4 % — ABNORMAL LOW (ref 39.0–52.0)
Hemoglobin: 11.1 g/dL — ABNORMAL LOW (ref 13.0–17.0)
Lymphocytes Relative: 45 %
Lymphs Abs: 1.3 10*3/uL (ref 0.7–4.0)
MCH: 23.1 pg — ABNORMAL LOW (ref 26.0–34.0)
MCHC: 31.4 g/dL (ref 30.0–36.0)
MCV: 73.6 fL — ABNORMAL LOW (ref 78.0–100.0)
MONO ABS: 0.3 10*3/uL (ref 0.1–1.0)
Monocytes Relative: 9 %
Neutro Abs: 1.2 10*3/uL — ABNORMAL LOW (ref 1.7–7.7)
Neutrophils Relative %: 44 %
PLATELETS: 177 10*3/uL (ref 150–400)
RBC: 4.81 MIL/uL (ref 4.22–5.81)
RDW: 16.5 % — AB (ref 11.5–15.5)
WBC: 2.8 10*3/uL — ABNORMAL LOW (ref 4.0–10.5)

## 2016-10-28 LAB — COMPREHENSIVE METABOLIC PANEL
ALBUMIN: 4 g/dL (ref 3.5–5.0)
ALT: 28 U/L (ref 17–63)
AST: 33 U/L (ref 15–41)
Alkaline Phosphatase: 55 U/L (ref 38–126)
Anion gap: 6 (ref 5–15)
BUN: 12 mg/dL (ref 6–20)
CHLORIDE: 105 mmol/L (ref 101–111)
CO2: 26 mmol/L (ref 22–32)
Calcium: 9.7 mg/dL (ref 8.9–10.3)
Creatinine, Ser: 0.9 mg/dL (ref 0.61–1.24)
GFR calc Af Amer: >60 mL/min (ref >60)
GFR calc non Af Amer: >60 mL/min (ref >60)
GLUCOSE: 135 mg/dL — AB (ref 65–99)
POTASSIUM: 4 mmol/L (ref 3.5–5.1)
SODIUM: 137 mmol/L (ref 135–145)
Total Bilirubin: 0.7 mg/dL (ref 0.3–1.2)
Total Protein: 7.5 g/dL (ref 6.5–8.1)

## 2016-10-28 LAB — CBG MONITORING, ED: Glucose-Capillary: 144 mg/dL — ABNORMAL HIGH (ref 65–99)

## 2016-10-28 LAB — VALPROIC ACID LEVEL: VALPROIC ACID LVL: 66 ug/mL (ref 50.0–100.0)

## 2016-10-28 NOTE — Telephone Encounter (Signed)
He will have appt December 19 2016
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
## 2016-10-28 NOTE — ED Notes (Signed)
Pt ambulated and tolerating fluids.  D/C home

## 2016-10-28 NOTE — ED Triage Notes (Addendum)
Per EMS, bystander called regarding pt fall.  Pt was walking and had syncopal episode.  Pt has hx of seizures.  Cannot confirm seizure-like activity during event.  Appeared postictal on arrival.  No memory of event.  Pt states he has been taking his meds as ordered.  Ambulatory with confusion on scene.  Last seizure months ago.  Vitals:  130/82, hr 84, resp 18, cbg 161

## 2016-10-28 NOTE — Telephone Encounter (Signed)
Pt's aunt called to advise the pt had a seizure [facility]. She said he was taking a walk, he had a seizure and [facility] He is still there at this time. She said she will be bringing him home a later today  FYI

## 2016-10-28 NOTE — ED Notes (Signed)
Spoke with MD regarding heart rhythm.  Hx of same.

## 2016-10-28 NOTE — ED Notes (Signed)
Pt IV taken out per RN

## 2016-10-28 NOTE — ED Notes (Signed)
Bed: WA13 Expected date:  Expected time:  Means of arrival:  Comments: EMS-SZ 

## 2016-10-28 NOTE — ED Notes (Signed)
Pt used urinal with no assist

## 2016-10-28 NOTE — Discharge Instructions (Signed)
You were seen today for a seizure. You have a seizure disorder. Please follow up with your neurologist for any change in medication.

## 2016-11-15 ENCOUNTER — Emergency Department (HOSPITAL_COMMUNITY)
Admission: EM | Admit: 2016-11-15 | Discharge: 2016-11-15 | Disposition: A | Discharge status: Home / Self Care | Payer: Medicaid Other | Attending: Emergency Medicine | Admitting: Emergency Medicine

## 2016-11-15 ENCOUNTER — Encounter (HOSPITAL_COMMUNITY): Payer: Self-pay

## 2016-11-15 DIAGNOSIS — G40909 Epilepsy, unspecified, not intractable, without status epilepticus: Secondary | ICD-10-CM | POA: Insufficient documentation

## 2016-11-15 DIAGNOSIS — Z87891 Personal history of nicotine dependence: Secondary | ICD-10-CM | POA: Diagnosis not present

## 2016-11-15 DIAGNOSIS — I10 Essential (primary) hypertension: Secondary | ICD-10-CM | POA: Diagnosis not present

## 2016-11-15 DIAGNOSIS — R569 Unspecified convulsions: Secondary | ICD-10-CM

## 2016-11-15 LAB — COMPREHENSIVE METABOLIC PANEL
ALT: 32 U/L (ref 17–63)
ANION GAP: 8 (ref 5–15)
AST: 40 U/L (ref 15–41)
Albumin: 4.1 g/dL (ref 3.5–5.0)
Alkaline Phosphatase: 62 U/L (ref 38–126)
BUN: 12 mg/dL (ref 6–20)
CHLORIDE: 102 mmol/L (ref 101–111)
CO2: 26 mmol/L (ref 22–32)
CREATININE: 0.94 mg/dL (ref 0.61–1.24)
Calcium: 9.7 mg/dL (ref 8.9–10.3)
Glucose, Bld: 98 mg/dL (ref 65–99)
POTASSIUM: 4.4 mmol/L (ref 3.5–5.1)
Sodium: 136 mmol/L (ref 135–145)
Total Bilirubin: 0.1 mg/dL — ABNORMAL LOW (ref 0.3–1.2)
Total Protein: 8 g/dL (ref 6.5–8.1)

## 2016-11-15 LAB — VALPROIC ACID LEVEL: VALPROIC ACID LVL: 65 ug/mL (ref 50.0–100.0)

## 2016-11-15 MED ORDER — SODIUM CHLORIDE 0.9 % IV SOLN
INTRAVENOUS | Status: DC
  Administered 2016-11-15: 18:00:00 via INTRAVENOUS

## 2016-11-15 NOTE — ED Notes (Signed)
PT DISCHARGED. INSTRUCTIONS GIVEN. AAOX4. PT IN NO APPARENT DISTRESS OR PAIN. THE OPPORTUNITY TO ASK QUESTIONS WAS PROVIDED. 
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
## 2016-11-15 NOTE — ED Triage Notes (Signed)
Per EMS, pt with hx of seizures had seizure lasting ~5 minutes this afternoon. Pt was post ictal following seizure and became A&O x 4 en route to hospital. Pt denies pain. Pt placed in c-collar while pt was post ictal. BP140/92, HR 101, RR 18, SpO2 100%, CBG 101, sinus tach on monitor.

## 2016-11-16 ENCOUNTER — Emergency Department (HOSPITAL_COMMUNITY): Payer: Medicaid Other

## 2016-11-16 ENCOUNTER — Emergency Department (HOSPITAL_COMMUNITY)
Admission: EM | Admit: 2016-11-16 | Discharge: 2016-11-16 | Disposition: A | Discharge status: Home / Self Care | Payer: Medicaid Other | Attending: Emergency Medicine | Admitting: Emergency Medicine

## 2016-11-16 DIAGNOSIS — Y929 Unspecified place or not applicable: Secondary | ICD-10-CM | POA: Insufficient documentation

## 2016-11-16 DIAGNOSIS — W19XXXA Unspecified fall, initial encounter: Secondary | ICD-10-CM | POA: Insufficient documentation

## 2016-11-16 DIAGNOSIS — R569 Unspecified convulsions: Secondary | ICD-10-CM | POA: Diagnosis present

## 2016-11-16 DIAGNOSIS — Y999 Unspecified external cause status: Secondary | ICD-10-CM | POA: Insufficient documentation

## 2016-11-16 DIAGNOSIS — Y9301 Activity, walking, marching and hiking: Secondary | ICD-10-CM | POA: Insufficient documentation

## 2016-11-16 DIAGNOSIS — R220 Localized swelling, mass and lump, head: Secondary | ICD-10-CM | POA: Diagnosis not present

## 2016-11-16 DIAGNOSIS — Z87891 Personal history of nicotine dependence: Secondary | ICD-10-CM | POA: Diagnosis not present

## 2016-11-16 DIAGNOSIS — I10 Essential (primary) hypertension: Secondary | ICD-10-CM | POA: Diagnosis not present

## 2016-11-16 MED ORDER — LORAZEPAM 1 MG PO TABS
1.0000 mg | ORAL_TABLET | Freq: Once | ORAL | Status: AC
  Administered 2016-11-16: 1 mg via ORAL
  Filled 2016-11-16: qty 1

## 2016-11-16 MED ORDER — LORAZEPAM 1 MG PO TABS: 1.0000 mg | ORAL_TABLET | Freq: Two times a day (BID) | ORAL | 0 refills | Status: DC
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
## 2016-11-16 NOTE — ED Notes (Signed)
Patient transported to CT 

## 2016-11-16 NOTE — Discharge Instructions (Signed)
Continue your current medications at their same dose without change.  Follow-up with your neurologist, Dr. Terrace ArabiaYan ,as scheduled

## 2016-11-16 NOTE — ED Triage Notes (Signed)
Pt. Here with reported seizure activity with a hy. Of epilepsy. He was seen here yesterday for the same thing. A/Ox4 now Vitals WDL with EMS.

## 2016-12-19 ENCOUNTER — Ambulatory Visit (INDEPENDENT_AMBULATORY_CARE_PROVIDER_SITE_OTHER): Payer: Medicaid Other | Admitting: Neurology

## 2016-12-19 ENCOUNTER — Encounter: Payer: Self-pay | Admitting: Neurology

## 2016-12-19 VITALS — BP 127/84 | HR 71 | Ht 73.0 in | Wt 182.0 lb

## 2016-12-19 DIAGNOSIS — G40119 Localization-related (focal) (partial) symptomatic epilepsy and epileptic syndromes with simple partial seizures, intractable, without status epilepticus: Secondary | ICD-10-CM

## 2016-12-19 DIAGNOSIS — F79 Unspecified intellectual disabilities: Secondary | ICD-10-CM | POA: Diagnosis not present

## 2016-12-19 DIAGNOSIS — I480 Paroxysmal atrial fibrillation: Secondary | ICD-10-CM | POA: Diagnosis not present

## 2016-12-19 DIAGNOSIS — R569 Unspecified convulsions: Secondary | ICD-10-CM | POA: Diagnosis not present
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
## 2017-02-03 ENCOUNTER — Telehealth: Payer: Self-pay

## 2017-02-03 ENCOUNTER — Encounter: Payer: Self-pay | Admitting: Family Medicine

## 2017-02-03 ENCOUNTER — Ambulatory Visit: Payer: Medicaid Other | Attending: Family Medicine | Admitting: Family Medicine

## 2017-02-03 VITALS — BP 155/100 | HR 80 | Temp 98.2°F | Resp 18 | Ht 73.0 in | Wt 237.4 lb

## 2017-02-03 DIAGNOSIS — I10 Essential (primary) hypertension: Secondary | ICD-10-CM

## 2017-02-03 DIAGNOSIS — K029 Dental caries, unspecified: Secondary | ICD-10-CM | POA: Diagnosis not present

## 2017-02-03 DIAGNOSIS — R569 Unspecified convulsions: Secondary | ICD-10-CM | POA: Diagnosis not present

## 2017-02-03 DIAGNOSIS — F79 Unspecified intellectual disabilities: Secondary | ICD-10-CM | POA: Diagnosis not present

## 2017-02-03 DIAGNOSIS — Z0000 Encounter for general adult medical examination without abnormal findings: Secondary | ICD-10-CM | POA: Diagnosis not present

## 2017-02-03 DIAGNOSIS — G40209 Localization-related (focal) (partial) symptomatic epilepsy and epileptic syndromes with complex partial seizures, not intractable, without status epilepticus: Secondary | ICD-10-CM

## 2017-02-03 DIAGNOSIS — R109 Unspecified abdominal pain: Secondary | ICD-10-CM | POA: Diagnosis not present

## 2017-02-03 DIAGNOSIS — K5900 Constipation, unspecified: Secondary | ICD-10-CM | POA: Diagnosis not present

## 2017-02-03 DIAGNOSIS — M791 Myalgia: Secondary | ICD-10-CM | POA: Diagnosis not present

## 2017-02-03 DIAGNOSIS — Z9181 History of falling: Secondary | ICD-10-CM | POA: Insufficient documentation

## 2017-02-03 DIAGNOSIS — M7918 Myalgia, other site: Secondary | ICD-10-CM

## 2017-02-03 DIAGNOSIS — Z79899 Other long term (current) drug therapy: Secondary | ICD-10-CM | POA: Diagnosis not present

## 2017-02-03 DIAGNOSIS — I480 Paroxysmal atrial fibrillation: Secondary | ICD-10-CM

## 2017-02-03 DIAGNOSIS — R12 Heartburn: Secondary | ICD-10-CM | POA: Diagnosis not present

## 2017-02-03 MED ORDER — BENZOCAINE 10 % MT GEL: 1.0000 "application " | OROMUCOSAL | 0 refills | Status: DC | PRN

## 2017-02-03 MED ORDER — NAPROXEN 500 MG PO TABS: 500.0000 mg | ORAL_TABLET | Freq: Two times a day (BID) | ORAL | 0 refills | Status: DC

## 2017-02-03 MED ORDER — DIVALPROEX SODIUM ER 500 MG PO TB24: 1000.0000 mg | ORAL_TABLET | Freq: Two times a day (BID) | ORAL | 0 refills | Status: DC

## 2017-02-03 MED ORDER — LEVETIRACETAM 1000 MG PO TABS: 2000.0000 mg | ORAL_TABLET | Freq: Two times a day (BID) | ORAL | 0 refills | Status: DC

## 2017-02-03 MED ORDER — RANITIDINE HCL 150 MG PO TABS: 150.0000 mg | ORAL_TABLET | Freq: Two times a day (BID) | ORAL | 0 refills | Status: DC

## 2017-02-03 MED ORDER — DILTIAZEM HCL ER COATED BEADS 240 MG PO CP24: 240.0000 mg | ORAL_CAPSULE | Freq: Every day | ORAL | 0 refills | Status: DC

## 2017-02-03 MED ORDER — LACOSAMIDE 150 MG PO TABS: 2.0000 | ORAL_TABLET | Freq: Two times a day (BID) | ORAL | 0 refills | Status: DC

## 2017-02-03 MED ORDER — SENNOSIDES-DOCUSATE SODIUM 8.6-50 MG PO TABS: 1.0000 | ORAL_TABLET | Freq: Every day | ORAL | Status: DC

## 2017-02-03 MED FILL — DILTIAZEM 24HR ER 240 MG CA: 240 | 30 days supply | Qty: 30 | Fill #0

## 2017-02-03 MED FILL — NAPROXEN 500 MG TABLET: 500 | 30 days supply | Qty: 60 | Fill #0

## 2017-02-03 NOTE — Telephone Encounter (Addendum)
Referral received for home health services/ home health aide from [place], [place].  Attempted to contact the patient to discuss the options of home health nursing, [facility] and possibly [facility]  Call placed to # [phone] and a HIPAA compliant voicemail message was left requesting a call back to # [phone].  Call placed to Johney Frame, [facility] who confirmed that the patient, if interested, can be referred for their case management services

## 2017-02-03 NOTE — Patient Instructions (Signed)
You will be called with your labs results.   Hypertension Hypertension is another name for high blood pressure. High blood pressure forces your heart to work harder to pump blood. This can cause problems over time. There are two numbers in a blood pressure reading. There is a top number (systolic) over a bottom number (diastolic). It is best to have a blood pressure below 120/80. Healthy choices can help lower your blood pressure. You may need medicine to help lower your blood pressure if:  Your blood pressure cannot be lowered with healthy choices.  Your blood pressure is higher than 130/80. Follow these instructions at home: Eating and drinking   If directed, follow the DASH eating plan. This diet includes:  Filling half of your plate at each meal with fruits and vegetables.  Filling one quarter of your plate at each meal with whole grains. Whole grains include whole wheat pasta, brown rice, and whole grain bread.  Eating or drinking low-fat dairy products, such as skim milk or low-fat yogurt.  Filling one quarter of your plate at each meal with low-fat (lean) proteins. Low-fat proteins include fish, skinless chicken, eggs, beans, and tofu.  Avoiding fatty meat, cured and processed meat, or chicken with skin.  Avoiding premade or processed food.  Eat less than 1,500 mg of salt (sodium) a day.  Limit alcohol use to no more than 1 drink a day for nonpregnant women and 2 drinks a day for men. One drink equals 12 oz of beer, 5 oz of wine, or 1 oz of hard liquor. Lifestyle   Work with your doctor to stay at a healthy weight or to lose weight. Ask your doctor what the best weight is for you.  Get at least 30 minutes of exercise that causes your heart to beat faster (aerobic exercise) most days of the week. This may include walking, swimming, or biking.  Get at least 30 minutes of exercise that strengthens your muscles (resistance exercise) at least 3 days a week. This may include  lifting weights or pilates.  Do not use any products that contain nicotine or tobacco. This includes cigarettes and e-cigarettes. If you need help quitting, ask your doctor.  Check your blood pressure at home as told by your doctor.  Keep all follow-up visits as told by your doctor. This is important. Medicines   Take over-the-counter and prescription medicines only as told by your doctor. Follow directions carefully.  Do not skip doses of blood pressure medicine. The medicine does not work as well if you skip doses. Skipping doses also puts you at risk for problems.  Ask your doctor about side effects or reactions to medicines that you should watch for. Contact a doctor if:  You think you are having a reaction to the medicine you are taking.  You have headaches that keep coming back (recurring).  You feel dizzy.  You have swelling in your ankles.  You have trouble with your vision. Get help right away if:  You get a very bad headache.  You start to feel confused.  You feel weak or numb.  You feel faint.  You get very bad pain in your:  Chest.  Belly (abdomen).  You throw up (vomit) more than once.  You have trouble breathing. Summary  Hypertension is another name for high blood pressure.  Making healthy choices can help lower blood pressure. If your blood pressure cannot be controlled with healthy choices, you may need to take medicine. This information is   not intended to replace advice given to you by your health care provider. Make sure you discuss any questions you have with your health care provider. Document Released: 03/25/2008 Document Revised: 09/04/2016 Document Reviewed: 09/04/2016 Elsevier Interactive Patient Education  2017 Elsevier Inc[employer]    

## 2017-02-03 NOTE — Progress Notes (Signed)
Patient is here for establish care   Patient is requesting personal home care form  Patient has body have & been having seizures  Patient has been without medication

## 2017-02-03 NOTE — Progress Notes (Signed)
Subjective:  Patient ID: Seth Brock, male    DOB: Dec 08, 1960  Age: 56 y.o. MRN: [id number]  CC: Establish Care   HPI [name] presents for   Afib/ HTN: Asymptomatic paroxysmal afib Cardizem use. Denies any CP, SOB, or swelling of the BLE. BP elevated in office, had not taken medication prior to visit. He reports taking medications at 10 am. Caregiver reports BP at home when he is taking medication range SBP 135-140's; DBP 88-90.   Seizure: History of seizure and mental retardation. History of vagal nerve stimulator placement May 2017. Recent VNS setting adjustment Followed by neurologist. Takes Vimpat, Depakote, and Keppra for seizures. Requesting refills be sent to our pharmacy due to medication cost.  Musculoskeletal pain: History of a seizure with unwitnessed fall at home this earlier this month.  2 week history of generalized pain to the bilateral knees, legs and back. Reports taking OTC Aleve with minimal relief of symptoms.   Abdominal pain: Denies any constitutional symptoms, N/V, or bloody stools. Reports weekly episodes of  heartburn. Denies taking anything for symptoms.  Denies diarrhea but reports symptoms of constipation for the last 2 months. BM every other day. Denies taking anything for symptoms.   Home Health: Caregiver expresses need for [facility] services. Pt.has difficulty with ADL's including dressing himself. Caregiver reports she is on disability herself and needs additional assistance caring for patient. She is requesting [place] aide.    Outpatient Medications Prior to Visit  Medication Sig Dispense Refill  . LORazepam (ATIVAN) 1 MG tablet Take 1 tablet (1 mg total) by mouth 2 (two) times daily. 4 tablet 0  . diltiazem (CARDIZEM CD) 180 MG 24 hr capsule Take 1 capsule (180 mg total) by mouth daily. 90 capsule 4  . divalproex (DEPAKOTE ER) 500 MG 24 hr tablet Take 2 tablets (1,000 mg total) by mouth 2 (two) times daily. 360 tablet 4  . Lacosamide (VIMPAT) 150 MG TABS Take  2 tablets (300 mg total) by mouth 2 (two) times daily. 360 tablet 4  . levETIRAcetam (KEPPRA) 1000 MG tablet Take 2 tablets (2,000 mg total) by mouth 2 (two) times daily. 360 tablet 4   No facility-administered medications prior to visit.     ROS Review of Systems  Constitutional: Negative.   Eyes: Negative.   Respiratory: Negative.   Cardiovascular: Negative.   Gastrointestinal: Negative.   Musculoskeletal: Positive for arthralgias and myalgias.  Skin: Negative.   Neurological:       History of seizure disorder  Psychiatric/Behavioral: Negative.     Objective:  BP (!) 155/100 (BP Location: Left Arm, Patient Position: Sitting, Cuff Size: Normal)   Pulse 80   Temp 98.2 F (36.8 C) (Oral)   Resp 18   Ht '6\' 1"'$  (1.854 m)   Wt 237 lb 6.4 oz (107.7 kg)   SpO2 100%   BMI 31.32 kg/m   BP/Weight 02/03/2017 12/19/2016 7/74/1287  Systolic BP 867 672 094  Diastolic BP 709 84 98  Wt. (Lbs) 237.4 182 -  BMI 31.32 24.01 -      Physical Exam  HENT:  Head: Atraumatic.  Right Ear: External ear normal.  Left Ear: External ear normal.  Nose: Nose normal.  Mouth/Throat: Oropharynx is clear and moist.  Eyes: Conjunctivae are normal. Pupils are equal, round, and reactive to light.  Neck: Normal range of motion. Neck supple.  Cardiovascular: Normal rate, normal heart sounds and intact distal pulses.  A regularly irregular rhythm present.  Pulmonary/Chest: Effort normal and breath  sounds normal.  Abdominal: Soft. Bowel sounds are normal. There is tenderness (LLQ/epigastric).  Musculoskeletal: Normal range of motion.  Generalized musculoskeletal pain. Normal ROM. Muscle strength 5/5, excluding right hand grip 4/5.   Neurological: He has normal reflexes.  Skin: Skin is warm and dry.  Psychiatric: He has a normal mood and affect.  Nursing note and vitals reviewed.  Assessment & Plan:   Problem List Items Addressed This Visit      Cardiovascular and Mediastinum   Essential  hypertension - Primary   Increase dosage of diltiazem for better BP control.   Schedule BP recheck in 2 weeks with nurse.   If BP is greater than 90/60 (MAP 65 or greater) but not less than 130/80 may add Losartan 25 mg QD   Follow up with PCP in 3 months.    and recheck in another 2 weeks with clinic RN.   Relevant Medications   diltiazem (DILTIAZEM CD) 240 MG 24 hr capsule   Other Relevant Orders   CMP14+EGFR   Lipid Panel   Microalbumin/Creatinine Ratio, Urine   Paroxysmal atrial fibrillation (HCC)   Relevant Medications   diltiazem (DILTIAZEM CD) 240 MG 24 hr capsule     Nervous and Auditory   RESOLVED: Seizure disorder, complex partial (HCC)   Relevant Medications   divalproex (DEPAKOTE ER) 500 MG 24 hr tablet   Lacosamide (VIMPAT) 150 MG TABS   levETIRAcetam (KEPPRA) 1000 MG tablet   Other Relevant Orders   Ambulatory referral to [place]     Other   Mental retardation   Relevant Orders   Ambulatory referral to [place]    Other Visit Diagnoses    Constipation, unspecified constipation type       Relevant Medications   senna-docusate (SENOKOT-S) 8.6-50 MG tablet   Heartburn       Relevant Medications   ranitidine (ZANTAC) 150 MG tablet   Healthcare maintenance       Relevant Orders   HIV antibody (with reflex)   Hepatitis C Antibody   Musculoskeletal pain       Relevant Medications   naproxen (NAPROSYN) 500 MG tablet   Dental decay       Relevant Medications   benzocaine (ORAJEL) 10 % mucosal gel   Other Relevant Orders   Ambulatory referral to Dentistry      Meds ordered this encounter  Medications  . diltiazem (DILTIAZEM CD) 240 MG 24 hr capsule    Sig: Take 1 capsule (240 mg total) by mouth daily.    Dispense:  90 capsule    Refill:  0    Order Specific Question:   Supervising Provider    Answer:   Tresa Garter [id number]  . divalproex (DEPAKOTE ER) 500 MG 24 hr tablet    Sig: Take 2 tablets (1,000 mg total) by mouth 2 (two) times  daily.    Dispense:  360 tablet    Refill:  0    Order Specific Question:   Supervising Provider    Answer:   Tresa Garter [id number]  . Lacosamide (VIMPAT) 150 MG TABS    Sig: Take 2 tablets (300 mg total) by mouth 2 (two) times daily.    Dispense:  360 tablet    Refill:  0    Order Specific Question:   Supervising Provider    Answer:   Tresa Garter [id number]  . levETIRAcetam (KEPPRA) 1000 MG tablet    Sig: Take 2 tablets (2,000 mg total) by mouth  2 (two) times daily.    Dispense:  360 tablet    Refill:  0    Order Specific Question:   Supervising Provider    Answer:   Tresa Garter [id number]  . naproxen (NAPROSYN) 500 MG tablet    Sig: Take 1 tablet (500 mg total) by mouth 2 (two) times daily with a meal.    Dispense:  60 tablet    Refill:  0    Order Specific Question:   Supervising Provider    Answer:   Tresa Garter [id number]  . benzocaine (ORAJEL) 10 % mucosal gel    Sig: Use as directed 1 application in the mouth or throat as needed for mouth pain.    Dispense:  5.3 g    Refill:  0    Order Specific Question:   Supervising Provider    Answer:   Tresa Garter [id number]  . ranitidine (ZANTAC) 150 MG tablet    Sig: Take 1 tablet (150 mg total) by mouth 2 (two) times daily.    Dispense:  60 tablet    Refill:  0    Order Specific Question:   Supervising Provider    Answer:   Tresa Garter [id number]  . senna-docusate (SENOKOT-S) 8.6-50 MG tablet    Sig: Take 1 tablet by mouth daily.    Order Specific Question:   Supervising Provider    Answer:   Tresa Garter [[id number]]    Follow-up: Return in about 2 weeks (around 02/17/2017) for BP check with clinic RN.   Alfonse Spruce FNP

## 2017-02-04 ENCOUNTER — Ambulatory Visit: Payer: Medicaid Other | Attending: Family Medicine

## 2017-02-04 ENCOUNTER — Telehealth: Payer: Self-pay

## 2017-02-04 LAB — CMP14+EGFR
ALBUMIN: 4.6 g/dL (ref 3.5–5.5)
ALK PHOS: 63 IU/L (ref 39–117)
ALT: 25 IU/L (ref 0–44)
AST: 24 IU/L (ref 0–40)
Albumin/Globulin Ratio: 1.2 (ref 1.2–2.2)
BILIRUBIN TOTAL: 0.4 mg/dL (ref 0.0–1.2)
BUN/Creatinine Ratio: 12 (ref 9–20)
BUN: 11 mg/dL (ref 6–24)
CHLORIDE: 104 mmol/L (ref 96–106)
CO2: 22 mmol/L (ref 18–29)
CREATININE: 0.92 mg/dL (ref 0.76–1.27)
Calcium: 10.6 mg/dL — ABNORMAL HIGH (ref 8.7–10.2)
GFR calc Af Amer: 109 mL/min/{1.73_m2} (ref >59)
GFR calc non Af Amer: 95 mL/min/{1.73_m2} (ref >59)
GLUCOSE: 83 mg/dL (ref 65–99)
Globulin, Total: 4 g/dL (ref 1.5–4.5)
Potassium: 4.5 mmol/L (ref 3.5–5.2)
Sodium: 144 mmol/L (ref 134–144)
Total Protein: 8.6 g/dL — ABNORMAL HIGH (ref 6.0–8.5)

## 2017-02-04 LAB — LIPID PANEL
CHOLESTEROL TOTAL: 206 mg/dL — AB (ref 100–199)
Chol/HDL Ratio: 9.4 ratio — ABNORMAL HIGH (ref 0.0–5.0)
HDL: 22 mg/dL — AB (ref >39)
LDL CALC: 143 mg/dL — AB (ref 0–99)
TRIGLYCERIDES: 204 mg/dL — AB (ref 0–149)
VLDL CHOLESTEROL CAL: 41 mg/dL — AB (ref 5–40)

## 2017-02-04 LAB — HEPATITIS C ANTIBODY

## 2017-02-04 LAB — MICROALBUMIN / CREATININE URINE RATIO
Creatinine, Urine: 335.8 mg/dL
MICROALBUM., U, RANDOM: 16.8 ug/mL
Microalb/Creat Ratio: 5 mg/g creat (ref 0.0–30.0)

## 2017-02-04 LAB — HIV ANTIBODY (ROUTINE TESTING W REFLEX): HIV Screen 4th Generation wRfx: NONREACTIVE

## 2017-02-04 NOTE — Telephone Encounter (Signed)
Attempted to contact the patient to discuss home health and PCS services. Call placed to # [phone] and a HIPAA compliant voicemail message was left requesting a call back to # [phone].

## 2017-02-06 ENCOUNTER — Telehealth: Payer: Self-pay

## 2017-02-06 ENCOUNTER — Other Ambulatory Visit: Payer: Self-pay | Admitting: Family Medicine

## 2017-02-06 DIAGNOSIS — E782 Mixed hyperlipidemia: Secondary | ICD-10-CM

## 2017-02-06 MED ORDER — ATORVASTATIN CALCIUM 20 MG PO TABS: 20.0000 mg | ORAL_TABLET | Freq: Every day | ORAL | 0 refills | Status: DC

## 2017-02-06 NOTE — Telephone Encounter (Signed)
CMA call Patient to go over lab results  Patient Verify DOB  Patient was aware and understood     

## 2017-02-06 NOTE — Telephone Encounter (Signed)
Patient's care taker, Satira Sark, requesting a call from nurse to discuss patient's medication   CB#: [phone]

## 2017-02-06 NOTE — Telephone Encounter (Signed)
-----   Message from Lizbeth Bark, FNP sent at 02/06/2017  4:32 AM EDT ----- HIV is negative.  Hepatitis C is negative.  Liver function normal Kidney function normal Calcium levels were elevated. Recommend scheduling lab appointment to evaluate your parathyroid function. Lipid levels were elevated. This can increase your risk of heart disease. You will be prescribed atorvastatin to help lower risk. Recommend recheck in 3 months.  Microalbumin/creatinine ratio level was normal. This tests for protein in your urine that can indicate early signs of kidney damage.

## 2017-02-10 ENCOUNTER — Telehealth: Payer: Self-pay

## 2017-02-10 NOTE — Telephone Encounter (Signed)
Call placed to the patient to discuss home health and personal care services. The patient gave the phone to his caregiver, Satira Sark, to complete the call. She said that the patient really needs  personal care services and she is agreeable is a referral for PCS. Explained to her that the home health services would be short term but the PCS would be long term. She said that she would like to hold off on the home health referral at this time.  This CM then explained that the patient might benefit from a referral to [facility], if he is eligible,  to assess his need for additional community resources. [name] was also agreeable to a referral to [facility].  PCS referral completed and forwarded to Arrie Senate, FNP for signature.   Referral to [facility] for care coordination faxed # [phone] after approval from M. Jenelle Mages, FNP.

## 2017-02-11 NOTE — Telephone Encounter (Signed)
CMA return Denies matthews call requesting to talk with a nurse regarding medictaion  Spoke with her & she did not had any question about medication she wanted to see about the personal care home

## 2017-02-11 NOTE — Telephone Encounter (Signed)
Signed PCS form faxed to [facility] - fax # [phone].

## 2017-02-14 ENCOUNTER — Ambulatory Visit: Payer: Medicaid Other | Attending: Family Medicine | Admitting: Family Medicine

## 2017-02-14 ENCOUNTER — Ambulatory Visit: Payer: Medicaid Other | Admitting: Family Medicine

## 2017-02-14 ENCOUNTER — Encounter: Payer: Self-pay | Admitting: Family Medicine

## 2017-02-14 VITALS — BP 147/88 | HR 76 | Temp 98.4°F | Resp 18 | Ht 73.0 in | Wt 236.8 lb

## 2017-02-14 DIAGNOSIS — Z79899 Other long term (current) drug therapy: Secondary | ICD-10-CM | POA: Diagnosis not present

## 2017-02-14 DIAGNOSIS — F79 Unspecified intellectual disabilities: Secondary | ICD-10-CM | POA: Insufficient documentation

## 2017-02-14 DIAGNOSIS — G40909 Epilepsy, unspecified, not intractable, without status epilepticus: Secondary | ICD-10-CM | POA: Diagnosis present

## 2017-02-14 DIAGNOSIS — F419 Anxiety disorder, unspecified: Secondary | ICD-10-CM | POA: Insufficient documentation

## 2017-02-14 DIAGNOSIS — I10 Essential (primary) hypertension: Secondary | ICD-10-CM | POA: Diagnosis not present

## 2017-02-14 MED ORDER — AMLODIPINE BESYLATE 10 MG PO TABS: 5.0000 mg | ORAL_TABLET | Freq: Every day | ORAL | 0 refills | Status: DC

## 2017-02-14 MED ORDER — LORAZEPAM 1 MG PO TABS: 1.0000 mg | ORAL_TABLET | Freq: Two times a day (BID) | ORAL | 0 refills | Status: DC | PRN

## 2017-02-14 NOTE — Progress Notes (Signed)
Subjective:  Patient ID: Seth Brock, male    DOB: 04-Nov-1960  Age: 56 y.o. MRN: [id number]  CC: Establish Care   HPI Aquila Delaughter presents for   Seizure: History of seizure and mental retardation. History of vagal nerve stimulator placement May 2017. Recent VNS setting adjustment Followed by neurologist. Takes Vimpat, Depakote, and Keppra for seizures. Caregiver and patient reports episode of seizure while riding the bus. Episode occurred yesterday afternoon and was gradual. Patient and caregiver report walking around [place] for exercise at 10 am and then riding the bus a few hours later. Onset of symptoms was 3 hours later around 1 pm.  He  only had one episode. He  had no loss of consciousness but caregiver reports patient was staring and had loss of awareness. Abnormal movements none present. Episode duration was  4 minutes. Premonitory symptoms included mood change. After episodes caregiver reports  he had  immediate return to normal level of consciousness. Denies any residual weakness or visual changes. Reports anxiety during and after episode. Requesting medication to help with anxiety.    Outpatient Medications Prior to Visit  Medication Sig Dispense Refill  . atorvastatin (LIPITOR) 20 MG tablet Take 1 tablet (20 mg total) by mouth daily. 90 tablet 0  . benzocaine (ORAJEL) 10 % mucosal gel Use as directed 1 application in the mouth or throat as needed for mouth pain. 5.3 g 0  . diltiazem (DILTIAZEM CD) 240 MG 24 hr capsule Take 1 capsule (240 mg total) by mouth daily. 90 capsule 0  . divalproex (DEPAKOTE ER) 500 MG 24 hr tablet Take 2 tablets (1,000 mg total) by mouth 2 (two) times daily. 360 tablet 0  . Lacosamide (VIMPAT) 150 MG TABS Take 2 tablets (300 mg total) by mouth 2 (two) times daily. 360 tablet 0  . levETIRAcetam (KEPPRA) 1000 MG tablet Take 2 tablets (2,000 mg total) by mouth 2 (two) times daily. 360 tablet 0  . naproxen (NAPROSYN) 500 MG tablet Take 1 tablet  (500 mg total) by mouth 2 (two) times daily with a meal. 60 tablet 0  . ranitidine (ZANTAC) 150 MG tablet Take 1 tablet (150 mg total) by mouth 2 (two) times daily. 60 tablet 0  . senna-docusate (SENOKOT-S) 8.6-50 MG tablet Take 1 tablet by mouth daily.    Marland Kitchen LORazepam (ATIVAN) 1 MG tablet Take 1 tablet (1 mg total) by mouth 2 (two) times daily. 4 tablet 0   No facility-administered medications prior to visit.     ROS Review of Systems  Constitutional: Negative.   HENT: Negative.   Eyes: Negative.   Respiratory: Negative.   Cardiovascular: Negative.   Musculoskeletal: Negative.   Neurological: Positive for seizures.  Psychiatric/Behavioral: The patient is nervous/anxious.     Objective:  BP (!) 147/88 (BP Location: Left Arm, Patient Position: Sitting, Cuff Size: Normal)   Pulse 76   Temp 98.4 F (36.9 C) (Oral)   Resp 18   Ht  (1.854 m)   Wt 236 lb 12.8 oz (107.4 kg)   SpO2 100%   BMI 31.24 kg/m   BP/Weight 02/14/2017 02/03/2017 12/19/2016  Systolic BP 147 155 127  Diastolic BP 88 100 84  Wt. (Lbs) 236.8 237.4 182  BMI 31.24 31.32 24.01    Physical Exam  Constitutional: He is oriented to person, place, and time. He appears well-developed and well-nourished.  HENT:  Head: Normocephalic and atraumatic.  Right Ear: External ear normal.  Left Ear: External ear normal.  Nose:  Nose normal.  Mouth/Throat: Oropharynx is clear and moist.  Eyes: Conjunctivae and EOM are normal. Pupils are equal, round, and reactive to light.  Cardiovascular: Normal rate, regular rhythm, normal heart sounds and intact distal pulses.   Pulmonary/Chest: Effort normal and breath sounds normal.  Musculoskeletal: Normal range of motion.  Neurological: He is alert and oriented to person, place, and time. He has normal strength.  History of intellectual disability.   Skin: Skin is warm and dry.  Psychiatric: He has a normal mood and affect.  Nursing note and vitals reviewed.   Assessment &  Plan:   Problem List Items Addressed This Visit      Cardiovascular and Mediastinum   Essential hypertension   Relevant Medications   amLODipine (NORVASC) 10 MG tablet    Other Visit Diagnoses    Seizure disorder (HCC)    -  Primary   Relevant Medications   LORazepam (ATIVAN) 1 MG tablet   Anxiety       Relevant Medications   LORazepam (ATIVAN) 1 MG tablet      Meds ordered this encounter  Medications  . LORazepam (ATIVAN) 1 MG tablet    Sig: Take 1 tablet (1 mg total) by mouth 2 (two) times daily as needed for anxiety or seizure.    Dispense:  4 tablet    Refill:  0    Order Specific Question:   Supervising Provider    Answer:   Quentin Angst [id number]  . amLODipine (NORVASC) 10 MG tablet    Sig: Take 0.5 tablets (5 mg total) by mouth daily.    Dispense:  90 tablet    Refill:  0    Order Specific Question:   Supervising Provider    Answer:   Quentin Angst [[id number]]    Follow-up: Return in about 2 weeks (around 02/28/2017) for Physical & HTN.   Lizbeth Bark FNP

## 2017-02-14 NOTE — Patient Instructions (Addendum)
Follow up with your neurologist. Epilepsy Epilepsy is when a person keeps having seizures. A seizure is unusual activity in the brain. A seizure can change how you think or behave, and it can make it hard to be aware of what is happening. This condition can cause problems, such as:  Falls, accidents, and injury.  Depression.  Poor memory.  Sudden unexplained death in epilepsy (SUDEP). This is rare. Its cause is not known. Most people with epilepsy lead normal lives. Follow these instructions at home: Medicines    Take medicines only as told by your doctor.  Avoid anything that may keep your medicine from working, such as alcohol. Activity   Get enough rest. Lack of sleep can make seizures more likely to occur.  Follow your doctor's advice about driving, swimming, and doing anything else that would be dangerous if you had a seizure. Teaching others  Teach friends and family what to do if you have a seizure. They should:  Lay you on the ground to prevent a fall.  Cushion your head and body.  Loosen any tight clothing around your neck.  Turn you on your side.  Stay with you until you are better.  Not hold you down.  Not put anything in your mouth.  Know whether or not you need emergency care. General instructions   Avoid anything that causes you to have seizures.  Keep a seizure diary. Write down what you remember about each seizure, and especially what might have caused it.  Keep all follow-up visits as told by your doctor. This is important. Contact a doctor if:  You have a change in your seizure pattern.  You get an infection or start to feel sick. You may have more seizures when you are sick. Get help right away if:  A seizure does not stop after 5 minutes.  You have more than one seizure in a row, and you do not have enough time between the seizures to feel better.  A seizure makes it harder to breathe.  A seizure is different from other seizures you  have had.  A seizure makes you unable to speak or use a part of your body.  You did not wake up right after a seizure. This information is not intended to replace advice given to you by your health care provider. Make sure you discuss any questions you have with your health care provider. Document Released: 08/04/2009 Document Revised: 05/13/2016 Document Reviewed: 04/16/2016 Elsevier Interactive Patient Education  2017 Elsevier Inc.   Amlodipine tablets What is this medicine? AMLODIPINE (am LOE di peen) is a calcium-channel blocker. It affects the amount of calcium found in your heart and muscle cells. This relaxes your blood vessels, which can reduce the amount of work the heart has to do. This medicine is used to lower high blood pressure. It is also used to prevent chest pain. This medicine may be used for other purposes; ask your health care provider or pharmacist if you have questions. COMMON BRAND NAME(S): Norvasc What should I tell my health care provider before I take this medicine? They need to know if you have any of these conditions: -heart problems like heart failure or aortic stenosis -liver disease -an unusual or allergic reaction to amlodipine, other medicines, foods, dyes, or preservatives -pregnant or trying to get pregnant -breast-feeding How should I use this medicine? Take this medicine by mouth with a glass of water. Follow the directions on the prescription label. Take your medicine at regular  intervals. Do not take more medicine than directed. Talk to your pediatrician regarding the use of this medicine in children. Special care may be needed. This medicine has been used in children as young as 6. Persons over 33 years old may have a stronger reaction to this medicine and need smaller doses. Overdosage: If you think you have taken too much of this medicine contact a poison control center or emergency room at once. NOTE: This medicine is only for you. Do not share  this medicine with others. What if I miss a dose? If you miss a dose, take it as soon as you can. If it is almost time for your next dose, take only that dose. Do not take double or extra doses. What may interact with this medicine? -herbal or dietary supplements -local or general anesthetics -medicines for high blood pressure -medicines for prostate problems -rifampin This list may not describe all possible interactions. Give your health care provider a list of all the medicines, herbs, non-prescription drugs, or dietary supplements you use. Also tell them if you smoke, drink alcohol, or use illegal drugs. Some items may interact with your medicine. What should I watch for while using this medicine? Visit your doctor or health care professional for regular check ups. Check your blood pressure and pulse rate regularly. Ask your health care professional what your blood pressure and pulse rate should be, and when you should contact him or her. This medicine may make you feel confused, dizzy or lightheaded. Do not drive, use machinery, or do anything that needs mental alertness until you know how this medicine affects you. To reduce the risk of dizzy or fainting spells, do not sit or stand up quickly, especially if you are an older patient. Avoid alcoholic drinks; they can make you more dizzy. Do not suddenly stop taking amlodipine. Ask your doctor or health care professional how you can gradually reduce the dose. What side effects may I notice from receiving this medicine? Side effects that you should report to your doctor or health care professional as soon as possible: -allergic reactions like skin rash, itching or hives, swelling of the face, lips, or tongue -breathing problems -changes in vision or hearing -chest pain -fast, irregular heartbeat -swelling of legs or ankles Side effects that usually do not require medical attention (report to your doctor or health care professional if they  continue or are bothersome): -dry mouth -facial flushing -nausea, vomiting -stomach gas, pain -tired, weak -trouble sleeping This list may not describe all possible side effects. Call your doctor for medical advice about side effects. You may report side effects to FDA at 1-800-FDA-1088. Where should I keep my medicine? Keep out of the reach of children. Store at room temperature between 59 and 86 degrees F (15 and 30 degrees C). Protect from light. Keep container tightly closed. Throw away any unused medicine after the expiration date. NOTE: This sheet is a summary. It may not cover all possible information. If you have questions about this medicine, talk to your doctor, pharmacist, or health care provider.  2018 Elsevier/Gold Standard (2012-09-04 11:40:58)
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
## 2017-02-17 NOTE — Telephone Encounter (Signed)
Call placed to [facility] # [phone], spoke to Claudine and who confirmed receipt of the referral and noted that an assessment has been scheduled for 02/19/17.

## 2017-03-12 ENCOUNTER — Telehealth: Payer: Self-pay

## 2017-03-12 NOTE — Telephone Encounter (Signed)
Call placed to [facility] to inquire about the status of the referral.  Spoke to HighlandJennifer who stated that the referral has been deferred because the patient and caregiver stated that they did not need the service at this time.

## 2017-03-24 ENCOUNTER — Ambulatory Visit: Payer: Medicaid Other | Admitting: Neurology

## 2017-03-24 ENCOUNTER — Telehealth: Payer: Self-pay | Admitting: *Deleted

## 2017-03-24 NOTE — Telephone Encounter (Signed)
No showed follow up appointment. 

## 2017-03-25 ENCOUNTER — Encounter: Payer: Self-pay | Admitting: Neurology

## 2017-03-26 ENCOUNTER — Other Ambulatory Visit: Payer: Self-pay

## 2017-03-26 ENCOUNTER — Emergency Department (HOSPITAL_COMMUNITY)
Admission: EM | Admit: 2017-03-26 | Discharge: 2017-03-26 | Disposition: A | Discharge status: Home / Self Care | Payer: Medicaid Other | Attending: Emergency Medicine | Admitting: Emergency Medicine

## 2017-03-26 ENCOUNTER — Encounter (HOSPITAL_COMMUNITY): Payer: Self-pay

## 2017-03-26 ENCOUNTER — Emergency Department (HOSPITAL_COMMUNITY): Payer: Medicaid Other

## 2017-03-26 DIAGNOSIS — Z791 Long term (current) use of non-steroidal anti-inflammatories (NSAID): Secondary | ICD-10-CM | POA: Insufficient documentation

## 2017-03-26 DIAGNOSIS — Z9119 Patient's noncompliance with other medical treatment and regimen: Secondary | ICD-10-CM | POA: Insufficient documentation

## 2017-03-26 DIAGNOSIS — I10 Essential (primary) hypertension: Secondary | ICD-10-CM | POA: Diagnosis not present

## 2017-03-26 DIAGNOSIS — Z91199 Patient's noncompliance with other medical treatment and regimen due to unspecified reason: Secondary | ICD-10-CM

## 2017-03-26 DIAGNOSIS — F79 Unspecified intellectual disabilities: Secondary | ICD-10-CM

## 2017-03-26 DIAGNOSIS — I480 Paroxysmal atrial fibrillation: Secondary | ICD-10-CM

## 2017-03-26 DIAGNOSIS — Z79899 Other long term (current) drug therapy: Secondary | ICD-10-CM | POA: Diagnosis not present

## 2017-03-26 DIAGNOSIS — G40919 Epilepsy, unspecified, intractable, without status epilepticus: Secondary | ICD-10-CM

## 2017-03-26 DIAGNOSIS — Z87891 Personal history of nicotine dependence: Secondary | ICD-10-CM | POA: Insufficient documentation

## 2017-03-26 DIAGNOSIS — G40209 Localization-related (focal) (partial) symptomatic epilepsy and epileptic syndromes with complex partial seizures, not intractable, without status epilepticus: Secondary | ICD-10-CM

## 2017-03-26 LAB — CBC
HEMATOCRIT: 33.7 % — AB (ref 39.0–52.0)
HEMOGLOBIN: 10.6 g/dL — AB (ref 13.0–17.0)
MCH: 22.6 pg — AB (ref 26.0–34.0)
MCHC: 31.5 g/dL (ref 30.0–36.0)
MCV: 72 fL — ABNORMAL LOW (ref 78.0–100.0)
Platelets: 173 10*3/uL (ref 150–400)
RBC: 4.68 MIL/uL (ref 4.22–5.81)
RDW: 15.2 % (ref 11.5–15.5)
WBC: 4 10*3/uL (ref 4.0–10.5)

## 2017-03-26 LAB — BASIC METABOLIC PANEL
Anion gap: 6 (ref 5–15)
BUN: 9 mg/dL (ref 6–20)
CHLORIDE: 105 mmol/L (ref 101–111)
CO2: 26 mmol/L (ref 22–32)
Calcium: 10 mg/dL (ref 8.9–10.3)
Creatinine, Ser: 1.14 mg/dL (ref 0.61–1.24)
GFR calc Af Amer: >60 mL/min (ref >60)
GFR calc non Af Amer: >60 mL/min (ref >60)
Glucose, Bld: 109 mg/dL — ABNORMAL HIGH (ref 65–99)
POTASSIUM: 4.3 mmol/L (ref 3.5–5.1)
SODIUM: 137 mmol/L (ref 135–145)

## 2017-03-26 LAB — VALPROIC ACID LEVEL

## 2017-03-26 MED ORDER — LEVETIRACETAM 500 MG PO TABS
1000.0000 mg | ORAL_TABLET | Freq: Once | ORAL | Status: AC
  Administered 2017-03-26: 1000 mg via ORAL
  Filled 2017-03-26: qty 2

## 2017-03-26 MED ORDER — LEVETIRACETAM 1000 MG PO TABS: 2000.0000 mg | ORAL_TABLET | Freq: Two times a day (BID) | ORAL | 0 refills | Status: DC

## 2017-03-26 MED ORDER — LACOSAMIDE 50 MG PO TABS
300.0000 mg | ORAL_TABLET | Freq: Once | ORAL | Status: AC
  Administered 2017-03-26: 300 mg via ORAL
  Filled 2017-03-26: qty 6

## 2017-03-26 MED ORDER — LACOSAMIDE 150 MG PO TABS: 2.0000 | ORAL_TABLET | Freq: Two times a day (BID) | ORAL | 0 refills | Status: DC

## 2017-03-26 MED ORDER — DIVALPROEX SODIUM ER 500 MG PO TB24: 1000.0000 mg | ORAL_TABLET | Freq: Two times a day (BID) | ORAL | 0 refills | Status: DC

## 2017-03-26 MED ORDER — DIVALPROEX SODIUM 250 MG PO DR TAB
500.0000 mg | DELAYED_RELEASE_TABLET | Freq: Once | ORAL | Status: AC
  Administered 2017-03-26: 500 mg via ORAL
  Filled 2017-03-26: qty 2

## 2017-03-26 MED ORDER — SODIUM CHLORIDE 0.9 % IV BOLUS (SEPSIS)
1000.0000 mL | Freq: Once | INTRAVENOUS | Status: AC
  Administered 2017-03-26: 1000 mL via INTRAVENOUS

## 2017-03-26 NOTE — ED Notes (Signed)
Pt transported to xray 

## 2017-03-26 NOTE — Progress Notes (Signed)
ED CM met with patient to discuss medication needs. CM verified patient has [facility] Medicaid [place] Access.  CM explained that he does not qualify for [facility] program at [facility]. CM made patient aware that he can take his prescriptions for chronic conditions to any [facility] or [facility] and let them know he does not have the co-pay at this time and make arrangements to pay at a later time. This is all done at the discretion of the pharmacy. Patient verbalized understanding teach back done.

## 2017-03-26 NOTE — ED Triage Notes (Signed)
Pt brought in by EMS due to having seizure like activity. Per EMS, pt was seen by by-stander having seizure like activity. Per EMS pt did not LOC or hit head. Pt a&ox3.

## 2017-03-26 NOTE — ED Provider Notes (Signed)
I saw and evaluated the patient, reviewed the resident's note and I agree with the findings and plan.   EKG Interpretation None      56 year old male who presents with witnessed seizure. History of seizure disorder. Also history of cognitive impairment. Was recently incarcerated, and was released today. He had a witnessed seizure by bystanders and brought to ED. States that he has been compliant with his medications at his facility. He currently denies any headache, vision or speech changes, numbness or weakness, or any pain. Eat and drinking well. No recent illness.  Patient with normal mental status in ED. No neuro deficits. Vitals stable. Blood work reassuring. Diagnosed with breakthrough seizure. Given seizure medications. Strict return and follow-up instructions reviewed. He expressed understanding of all discharge instructions and felt comfortable with the plan of care.    Lavera GuiseLiu, Sharnay Cashion Duo, MD 03/26/17 2351
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
## 2017-03-28 ENCOUNTER — Encounter (HOSPITAL_COMMUNITY): Payer: Self-pay

## 2017-03-28 ENCOUNTER — Emergency Department (HOSPITAL_COMMUNITY)
Admission: EM | Admit: 2017-03-28 | Discharge: 2017-03-28 | Disposition: A | Discharge status: Home / Self Care | Payer: Medicaid Other | Attending: Emergency Medicine | Admitting: Emergency Medicine

## 2017-03-28 DIAGNOSIS — F79 Unspecified intellectual disabilities: Secondary | ICD-10-CM | POA: Diagnosis not present

## 2017-03-28 DIAGNOSIS — Z87891 Personal history of nicotine dependence: Secondary | ICD-10-CM | POA: Diagnosis not present

## 2017-03-28 DIAGNOSIS — Z79899 Other long term (current) drug therapy: Secondary | ICD-10-CM | POA: Diagnosis not present

## 2017-03-28 DIAGNOSIS — Z760 Encounter for issue of repeat prescription: Secondary | ICD-10-CM | POA: Diagnosis present

## 2017-03-28 DIAGNOSIS — I10 Essential (primary) hypertension: Secondary | ICD-10-CM | POA: Insufficient documentation

## 2017-03-28 NOTE — Discharge Instructions (Signed)
Your [facility] for [facility] and [facility] [address]. Please pick these up as soon as possible. Continue other home medications as previously prescribed. Return to ED for seizures, head injury, vomiting, trouble breathing or chest pain.

## 2017-03-28 NOTE — ED Triage Notes (Signed)
Patient states he is homeless and needs Keppra and depakote meds . Patient states he is unable to purchase.

## 2017-03-28 NOTE — Care Management (Signed)
ED CM received consult on patient regarding medication assistance. This CM met with this patient at [facility] ED on Wednesday with same c/o medication needs, this patient has Medicaid CM contacted [facility] on [address] who agreed to waive co-pay of $3 per prescription. CM contacted [facility] today and spoke with [facility]. medications are ready for pick up. CM relayed this message to [facility] ED CSW.

## 2017-03-28 NOTE — Progress Notes (Signed)
Consult request has been received. CSW attempting to follow up at present time.  CSW provided pt with directions to [facility] on [address] where his meds have been sent by the CM.  Per CM pt is on meds for seizures, BP and a med for either BP or cardiac needs.  CM is calling prescriptions in now.  CSW has called Jinny SandersBetty Davis with [facility] [place] and miss Earlene PlaterDavis will [facility] at approx 9:30pm on 03/28/16 to assess and admit pt for long-term residential living.  Miss Earlene PlaterDavis is aware she needs to pick pt up and then [facility] and has spoke to the CM and been instructed on the specifics of the pt's med needs.  Pt is agreeable to signing over $600 of his disability check for cost of his housing, rides to the doctors and to the pharmacy.  [facility] until Miss Earlene PlaterDavis arrives tonight at 9:30pm.  CSW will update RN and security.  Please reconsult if future social work needs arise.  CSW signing off.   Dorothe PeaJonathan F. Kristeen Lantz, Theresia MajorsLCSWA, LCAS Clinical Social Worker Ph: [phone]
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
## 2017-03-28 NOTE — Progress Notes (Signed)
Per pt, a [name] at [phone] has his EBT card and has control of his disability check, or possibly the SSA still has his check, pt is not sure.  Pt does not know Denise's last name or address but says [name] "put me out and then moved away".  CSW called [name] and left a HIPPA-compliant VM asking her to call CSW back at [phone]. CSW will continue to follow for D/C needs.  Seth Brock, Theresia MajorsLCSWA, LCAS Clinical Social Worker Ph: [phone]    '

## 2017-03-28 NOTE — ED Notes (Signed)
Social worker is working on getting housing for pt. Pt is to be discharged once social worker is done with pt. Pt is to sit in lobby once discharged to wait for ride to new home.

## 2017-03-31 ENCOUNTER — Telehealth (HOSPITAL_COMMUNITY): Payer: Self-pay | Admitting: *Deleted

## 2017-03-31 NOTE — Telephone Encounter (Signed)
Pt on ED afib follow up list.  Not appropriate for follow up, no afib during visit.  Pt seen for seizure

## 2017-05-07 ENCOUNTER — Encounter (HOSPITAL_COMMUNITY): Payer: Self-pay | Admitting: *Deleted

## 2017-05-07 ENCOUNTER — Emergency Department (HOSPITAL_COMMUNITY): Payer: Medicaid Other

## 2017-05-07 ENCOUNTER — Emergency Department (HOSPITAL_COMMUNITY)
Admission: EM | Admit: 2017-05-07 | Discharge: 2017-05-07 | Disposition: A | Discharge status: Home / Self Care | Payer: Medicaid Other | Attending: Emergency Medicine | Admitting: Emergency Medicine

## 2017-05-07 DIAGNOSIS — Z87891 Personal history of nicotine dependence: Secondary | ICD-10-CM | POA: Diagnosis not present

## 2017-05-07 DIAGNOSIS — Z79899 Other long term (current) drug therapy: Secondary | ICD-10-CM | POA: Diagnosis not present

## 2017-05-07 DIAGNOSIS — I4892 Unspecified atrial flutter: Secondary | ICD-10-CM | POA: Insufficient documentation

## 2017-05-07 DIAGNOSIS — I480 Paroxysmal atrial fibrillation: Secondary | ICD-10-CM | POA: Diagnosis not present

## 2017-05-07 DIAGNOSIS — R569 Unspecified convulsions: Secondary | ICD-10-CM | POA: Diagnosis not present

## 2017-05-07 DIAGNOSIS — I10 Essential (primary) hypertension: Secondary | ICD-10-CM | POA: Insufficient documentation

## 2017-05-07 HISTORY — DX: Family history of ischemic heart disease and other diseases of the circulatory system: Z82.49

## 2017-05-07 HISTORY — DX: Atherosclerotic heart disease of native coronary artery without angina pectoris: I25.10

## 2017-05-07 LAB — COMPREHENSIVE METABOLIC PANEL
ALT: 18 U/L (ref 17–63)
AST: 36 U/L (ref 15–41)
Albumin: 3.7 g/dL (ref 3.5–5.0)
Alkaline Phosphatase: 52 U/L (ref 38–126)
Anion gap: 8 (ref 5–15)
BUN: 14 mg/dL (ref 6–20)
CO2: 25 mmol/L (ref 22–32)
Calcium: 9.7 mg/dL (ref 8.9–10.3)
Chloride: 104 mmol/L (ref 101–111)
Creatinine, Ser: 1.03 mg/dL (ref 0.61–1.24)
GFR calc Af Amer: >60 mL/min (ref >60)
GFR calc non Af Amer: >60 mL/min (ref >60)
Glucose, Bld: 100 mg/dL — ABNORMAL HIGH (ref 65–99)
Potassium: 4.9 mmol/L (ref 3.5–5.1)
Sodium: 137 mmol/L (ref 135–145)
Total Bilirubin: 0.8 mg/dL (ref 0.3–1.2)
Total Protein: 7.7 g/dL (ref 6.5–8.1)

## 2017-05-07 LAB — TROPONIN I: Troponin I: <0.03 ng/mL (ref <0.03)

## 2017-05-07 LAB — VALPROIC ACID LEVEL: Valproic Acid Lvl: 92 ug/mL (ref 50.0–100.0)

## 2017-05-07 MED ORDER — LEVETIRACETAM 500 MG PO TABS
2000.0000 mg | ORAL_TABLET | Freq: Once | ORAL | Status: AC
  Administered 2017-05-07: 2000 mg via ORAL
  Filled 2017-05-07: qty 4
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
## 2017-05-07 NOTE — ED Provider Notes (Signed)
10:15 PM Case discussed with Dr. Allena Katzhakravartti of cardiology who has reviewed the patient's history. Cardiology advises against aggressive treatment of atrial flutter given that patient has no history of anticoagulation. This also makes him a poor candidate for electrical or chemical cardioversion. While starting the patient on 81 mg aspirin is reasonable, Dr. Allena Katzhakravartti also believes this can be done by a cardiologist during close follow up in the office. He advises to continue Cardizem as prescribed. Referral again placed to A-fib clinic for additional management of atrial flutter and PAF.  10:25 PM Plan discussed with patient. He reports compliance with his Cardizem. I have instructed him to follow up with a Cardiologist for further management of his irregular heart beat. He verbalizes understanding and comfort with discharge.   Vitals:   05/07/17 1900 05/07/17 1930 05/07/17 2100 05/07/17 2130  BP: 121/82 130/81 124/80 120/74  Pulse:   (!) 56 73  Resp:   12 12  Temp:      SpO2:   100% 100%  Weight:      Height:          Antony MaduraHumes, Tyvion Edmondson, PA-C 05/07/17 2229    Shaune PollackIsaacs, Cameron, MD 05/08/17 [phone]

## 2017-05-07 NOTE — ED Notes (Signed)
Found pt wandering in hallway, escorted back to room and instructed to wait for DC education.

## 2017-05-07 NOTE — ED Notes (Signed)
Pt back to c-t  E just returned from xray  Iv team came to start iv and get blood  Left when the pt was not in is room

## 2017-05-07 NOTE — Discharge Instructions (Signed)
Continue your daily medications including your daily Cardizem. Follow up with cardiology for further evaluation of your irregular heart beat. Also, see your Neurologist. You may return for new or concerning symptoms.

## 2017-05-07 NOTE — ED Triage Notes (Signed)
The pt arrived by gems from a field where he had a seizure while walking home  Hx of seizures  His last seizure was in January  He reports that he has been taking his seizure meds.  At present slow in answering questions  ??? Normal some incontinence that has dried up since he arrived  No tongue damage

## 2017-05-08 ENCOUNTER — Encounter (HOSPITAL_COMMUNITY): Payer: Self-pay | Admitting: Emergency Medicine

## 2017-05-08 ENCOUNTER — Emergency Department (HOSPITAL_COMMUNITY)
Admission: EM | Admit: 2017-05-08 | Discharge: 2017-05-08 | Disposition: A | Discharge status: Home / Self Care | Payer: Medicaid Other | Attending: Emergency Medicine | Admitting: Emergency Medicine

## 2017-05-08 DIAGNOSIS — Z79899 Other long term (current) drug therapy: Secondary | ICD-10-CM | POA: Diagnosis not present

## 2017-05-08 DIAGNOSIS — Z87898 Personal history of other specified conditions: Secondary | ICD-10-CM

## 2017-05-08 DIAGNOSIS — I10 Essential (primary) hypertension: Secondary | ICD-10-CM | POA: Insufficient documentation

## 2017-05-08 DIAGNOSIS — R4182 Altered mental status, unspecified: Secondary | ICD-10-CM | POA: Diagnosis not present

## 2017-05-08 DIAGNOSIS — I2510 Atherosclerotic heart disease of native coronary artery without angina pectoris: Secondary | ICD-10-CM | POA: Diagnosis not present

## 2017-05-08 DIAGNOSIS — F79 Unspecified intellectual disabilities: Secondary | ICD-10-CM | POA: Insufficient documentation

## 2017-05-08 DIAGNOSIS — Z8669 Personal history of other diseases of the nervous system and sense organs: Secondary | ICD-10-CM | POA: Diagnosis not present

## 2017-05-08 DIAGNOSIS — Z87891 Personal history of nicotine dependence: Secondary | ICD-10-CM | POA: Diagnosis not present

## 2017-05-08 NOTE — Discharge Instructions (Signed)
It was our pleasure to provide your ER care today - we hope that you feel better.  Follow up with your doctor/neurologist in the coming week.  Return to ER if worse, new symptoms, fevers, weak/fainting, recurrent seizures, other concern.

## 2017-05-08 NOTE — ED Triage Notes (Signed)
Per EMS: pt here after being found in another persons home; pt seen here last night for seizure; pt alert to person and place at present; CBG 94

## 2017-05-08 NOTE — ED Notes (Signed)
Patient ambulated around hallways well with steady gait. Patient given meal and OJ.
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
## 2017-05-12 ENCOUNTER — Inpatient Hospital Stay: Payer: Medicaid Other | Admitting: Family Medicine

## 2017-05-13 ENCOUNTER — Ambulatory Visit: Payer: Medicaid Other | Attending: Family Medicine | Admitting: Family Medicine

## 2017-05-13 ENCOUNTER — Encounter: Payer: Self-pay | Admitting: Family Medicine

## 2017-05-13 VITALS — BP 109/71 | HR 67 | Temp 98.0°F | Resp 18 | Ht 73.0 in | Wt 226.8 lb

## 2017-05-13 DIAGNOSIS — Z590 Homelessness: Secondary | ICD-10-CM | POA: Insufficient documentation

## 2017-05-13 DIAGNOSIS — I4892 Unspecified atrial flutter: Secondary | ICD-10-CM | POA: Diagnosis not present

## 2017-05-13 DIAGNOSIS — G40909 Epilepsy, unspecified, not intractable, without status epilepticus: Secondary | ICD-10-CM | POA: Diagnosis not present

## 2017-05-13 DIAGNOSIS — Z8679 Personal history of other diseases of the circulatory system: Secondary | ICD-10-CM | POA: Diagnosis not present

## 2017-05-13 DIAGNOSIS — Z79899 Other long term (current) drug therapy: Secondary | ICD-10-CM | POA: Insufficient documentation

## 2017-05-13 DIAGNOSIS — I10 Essential (primary) hypertension: Secondary | ICD-10-CM | POA: Insufficient documentation

## 2017-05-13 DIAGNOSIS — I480 Paroxysmal atrial fibrillation: Secondary | ICD-10-CM | POA: Diagnosis not present

## 2017-05-13 DIAGNOSIS — R569 Unspecified convulsions: Secondary | ICD-10-CM

## 2017-05-13 MED ORDER — DILTIAZEM HCL ER COATED BEADS 240 MG PO CP24: 240.0000 mg | ORAL_CAPSULE | Freq: Every day | ORAL | 3 refills | Status: DC

## 2017-05-13 MED ORDER — AMLODIPINE BESYLATE 5 MG PO TABS: 5.0000 mg | ORAL_TABLET | Freq: Every day | ORAL | 3 refills | Status: DC

## 2017-05-13 NOTE — Patient Instructions (Signed)
    Epilepsy Epilepsy is when a person keeps having seizures. A seizure is unusual activity in the brain. A seizure can change how you think or behave, and it can make it hard to be aware of what is happening. This condition can cause problems, such as:  Falls, accidents, and injury.  Depression.  Poor memory.  Sudden unexplained death in epilepsy (SUDEP). This is rare. Its cause is not known. Most people with epilepsy lead normal lives. Follow these instructions at home: Medicines  Take medicines only as told by your doctor.  Avoid anything that may keep your medicine from working, such as alcohol. Activity  Get enough rest. Lack of sleep can make seizures more likely to occur.  Follow your doctor's advice about driving, swimming, and doing anything else that would be dangerous if you had a seizure. Teaching others  Teach friends and family what to do if you have a seizure. They should:  Lay you on the ground to prevent a fall.  Cushion your head and body.  Loosen any tight clothing around your neck.  Turn you on your side.  Stay with you until you are better.  Not hold you down.  Not put anything in your mouth.  Know whether or not you need emergency care. General instructions  Avoid anything that causes you to have seizures.  Keep a seizure diary. Write down what you remember about each seizure, and especially what might have caused it.  Keep all follow-up visits as told by your doctor. This is important. Contact a doctor if:  You have a change in your seizure pattern.  You get an infection or start to feel sick. You may have more seizures when you are sick. Get help right away if:  A seizure does not stop after 5 minutes.  You have more than one seizure in a row, and you do not have enough time between the seizures to feel better.  A seizure makes it harder to breathe.  A seizure is different from other seizures you have had.  A seizure makes you  unable to speak or use a part of your body.  You did not wake up right after a seizure. This information is not intended to replace advice given to you by your health care provider. Make sure you discuss any questions you have with your health care provider. Document Released: 08/04/2009 Document Revised: 05/13/2016 Document Reviewed: 04/16/2016 Elsevier Interactive Patient Education  2017 Elsevier Inc.  

## 2017-05-13 NOTE — Progress Notes (Signed)
Subjective:  Patient ID: Seth Brock, male    DOB: 12-18-1960  Age: 56 y.o. MRN: [id number]  CC: Follow-up   HPI Seth Brock presents for follow up. Recent history of multiple ED visits for breakthrough seizures and medication refills. Previously had without his anti-epileptics medications for several weeks. Denies any seizures since last ED visit. PMH of seizure disorder, mental retardation, HTN, and a-fib. History of VNS was a no show for neurology appt.in June. Recent ED visit on 05/07/17 showed a-flutter on telemetry monitoring. When asked about medications he unsure of all the medications he is currently taking. He does not have medications with in him office. Recent history of homelessness in June. Was kicked out of home by guardian several weeks ago. He reports now living with someone. He is agreeable to speaking with LCSW at this time.   Outpatient Medications Prior to Visit  Medication Sig Dispense Refill  . atorvastatin (LIPITOR) 20 MG tablet Take 1 tablet (20 mg total) by mouth daily. 90 tablet 0  . benzocaine (ORAJEL) 10 % mucosal gel Use as directed 1 application in the mouth or throat as needed for mouth pain. 5.3 g 0  . divalproex (DEPAKOTE ER) 500 MG 24 hr tablet Take 2 tablets (1,000 mg total) by mouth 2 (two) times daily. 120 tablet 0  . Lacosamide (VIMPAT) 150 MG TABS Take 2 tablets (300 mg total) by mouth 2 (two) times daily. 120 tablet 0  . levETIRAcetam (KEPPRA) 1000 MG tablet Take 2 tablets (2,000 mg total) by mouth 2 (two) times daily. 120 tablet 0  . LORazepam (ATIVAN) 1 MG tablet Take 1 tablet (1 mg total) by mouth 2 (two) times daily as needed for anxiety or seizure. 4 tablet 0  . naproxen (NAPROSYN) 500 MG tablet Take 1 tablet (500 mg total) by mouth 2 (two) times daily with a meal. 60 tablet 0  . ranitidine (ZANTAC) 150 MG tablet Take 1 tablet (150 mg total) by mouth 2 (two) times daily. 60 tablet 0  . senna-docusate (SENOKOT-S) 8.6-50 MG tablet Take 1 tablet by  mouth daily.    Marland Kitchen. amLODipine (NORVASC) 10 MG tablet Take 0.5 tablets (5 mg total) by mouth daily. 90 tablet 0  . diltiazem (DILTIAZEM CD) 240 MG 24 hr capsule Take 1 capsule (240 mg total) by mouth daily. 90 capsule 0   No facility-administered medications prior to visit.     ROS Review of Systems  Constitutional: Negative.   HENT: Negative.   Eyes: Negative.   Respiratory: Negative.   Cardiovascular: Negative.   Gastrointestinal: Negative.   Musculoskeletal: Negative.   Skin: Negative.   Neurological: Positive for seizures.  Psychiatric/Behavioral:       History of mental retardation   Objective:  BP 109/71 (BP Location: Left Arm, Patient Position: Sitting, Cuff Size: Normal)   Pulse 67   Temp 98 F (36.7 C) (Oral)   Resp 18   Ht 6\' 1"  (1.854 m)   Wt 226 lb 12.8 oz (102.9 kg)   SpO2 99%   BMI 29.92 kg/m   BP/Weight 05/13/2017 05/08/2017 05/07/2017  Systolic BP 109 130 128  Diastolic BP 71 79 76  Wt. (Lbs) 226.8 250 250  BMI 29.92 32.98 32.98   Physical Exam  Constitutional: He is oriented to person, place, and time. He appears well-developed and well-nourished.  HENT:  Head: Normocephalic and atraumatic.  Right Ear: External ear normal.  Left Ear: External ear normal.  Nose: Nose normal.  Mouth/Throat: Oropharynx is clear  and moist.  Eyes: Pupils are equal, round, and reactive to light. Conjunctivae and EOM are normal.  Neck: Normal range of motion. Neck supple. No JVD present.  Cardiovascular: Normal rate, regular rhythm, normal heart sounds and intact distal pulses.   Pulmonary/Chest: Effort normal and breath sounds normal.  Abdominal: Soft. Bowel sounds are normal.  Musculoskeletal: Normal range of motion.  Neurological: He is alert and oriented to person, place, and time. He has normal reflexes.  Skin: Skin is warm and dry.  Psychiatric: He has a normal mood and affect.  Nursing note and vitals reviewed.  Assessment & Plan:   Problem List Items Addressed  This Visit      Cardiovascular and Mediastinum   Essential hypertension - Primary   Relevant Medications   diltiazem (DILTIAZEM CD) 240 MG 24 hr capsule   amLODipine (NORVASC) 5 MG tablet   Other Relevant Orders   Lipid Panel   Paroxysmal atrial fibrillation (HCC)   Relevant Medications   diltiazem (DILTIAZEM CD) 240 MG 24 hr capsule   amLODipine (NORVASC) 5 MG tablet   Other Relevant Orders   Ambulatory referral to Cardiology     Other   Seizures [facility])   Relevant Orders   Ambulatory referral to Neurology    Other Visit Diagnoses    History of atrial flutter       Relevant Orders   Ambulatory referral to Cardiology      Meds ordered this encounter  Medications  . diltiazem (DILTIAZEM CD) 240 MG 24 hr capsule    Sig: Take 1 capsule (240 mg total) by mouth daily.    Dispense:  90 capsule    Refill:  3    Must have office visit for refills.    Order Specific Question:   Supervising Provider    Answer:   Quentin Angst [id number]  . amLODipine (NORVASC) 5 MG tablet    Sig: Take 1 tablet (5 mg total) by mouth daily.    Dispense:  90 tablet    Refill:  3    Must have office visit for refills.    Order Specific Question:   Supervising Provider    Answer:   Quentin Angst [[id number]]    Follow-up: Return in about 3 months (around 08/13/2017), or if symptoms worsen or fail to improve, for HTN.   Lizbeth Bark FNP

## 2017-05-14 LAB — LIPID PANEL
CHOL/HDL RATIO: 5 ratio (ref 0.0–5.0)
Cholesterol, Total: 110 mg/dL (ref 100–199)
HDL: 22 mg/dL — ABNORMAL LOW (ref >39)
LDL CALC: 38 mg/dL (ref 0–99)
Triglycerides: 252 mg/dL — ABNORMAL HIGH (ref 0–149)
VLDL CHOLESTEROL CAL: 50 mg/dL — AB (ref 5–40)

## 2017-05-19 ENCOUNTER — Other Ambulatory Visit: Payer: Self-pay | Admitting: Family Medicine

## 2017-05-19 ENCOUNTER — Telehealth: Payer: Self-pay

## 2017-05-19 DIAGNOSIS — E782 Mixed hyperlipidemia: Secondary | ICD-10-CM

## 2017-05-19 MED ORDER — OMEGA-3-ACID ETHYL ESTERS 1 G PO CAPS: 1.0000 g | ORAL_CAPSULE | Freq: Two times a day (BID) | ORAL | 3 refills | Status: DC

## 2017-05-19 MED ORDER — ATORVASTATIN CALCIUM 20 MG PO TABS: 20.0000 mg | ORAL_TABLET | Freq: Every day | ORAL | 3 refills | Status: DC

## 2017-05-19 NOTE — Telephone Encounter (Signed)
CMA call regarding lab results  Automated system said that number that I dial is not in service    Only number on file

## 2017-05-19 NOTE — Telephone Encounter (Signed)
-----   Message from Lizbeth BarkMandesia R Hairston, FNP sent at 05/19/2017  1:49 PM EDT ----- Triglyceride levels are high. This can increase your risk of heart disease. Take atorvastatin daily. You will also be prescribed lovazza to help lower risk. Start eating a diet low in saturated fat. Limit your intake of fried foods, red meats, and whole milk. Increase activity.

## 2017-06-03 ENCOUNTER — Other Ambulatory Visit: Payer: Self-pay | Admitting: Neurology

## 2017-06-03 ENCOUNTER — Other Ambulatory Visit: Payer: Self-pay | Admitting: *Deleted

## 2017-06-03 DIAGNOSIS — G40209 Localization-related (focal) (partial) symptomatic epilepsy and epileptic syndromes with complex partial seizures, not intractable, without status epilepticus: Secondary | ICD-10-CM

## 2017-06-03 MED ORDER — LEVETIRACETAM 1000 MG PO TABS: 2000.0000 mg | ORAL_TABLET | Freq: Two times a day (BID) | ORAL | 0 refills | Status: DC

## 2017-06-03 MED ORDER — DIVALPROEX SODIUM ER 500 MG PO TB24: 1000.0000 mg | ORAL_TABLET | Freq: Two times a day (BID) | ORAL | 0 refills | Status: DC

## 2017-06-03 MED ORDER — LACOSAMIDE 150 MG PO TABS: 2.0000 | ORAL_TABLET | Freq: Two times a day (BID) | ORAL | 0 refills | Status: DC

## 2017-06-03 NOTE — Telephone Encounter (Signed)
[facility] as [facility].

## 2017-06-05 ENCOUNTER — Encounter: Payer: Self-pay | Admitting: Neurology

## 2017-06-05 ENCOUNTER — Ambulatory Visit (INDEPENDENT_AMBULATORY_CARE_PROVIDER_SITE_OTHER): Payer: Medicaid Other | Admitting: Neurology

## 2017-06-05 VITALS — BP 137/73 | HR 73 | Resp 18 | Ht 73.0 in | Wt 219.0 lb

## 2017-06-05 DIAGNOSIS — R569 Unspecified convulsions: Secondary | ICD-10-CM | POA: Diagnosis not present

## 2017-06-05 DIAGNOSIS — G40209 Localization-related (focal) (partial) symptomatic epilepsy and epileptic syndromes with complex partial seizures, not intractable, without status epilepticus: Secondary | ICD-10-CM | POA: Diagnosis not present

## 2017-06-05 DIAGNOSIS — F79 Unspecified intellectual disabilities: Secondary | ICD-10-CM | POA: Diagnosis not present

## 2017-06-05 MED ORDER — DIVALPROEX SODIUM ER 500 MG PO TB24: 1000.0000 mg | ORAL_TABLET | Freq: Two times a day (BID) | ORAL | 4 refills | Status: DC

## 2017-06-05 MED ORDER — LEVETIRACETAM 1000 MG PO TABS: 2000.0000 mg | ORAL_TABLET | Freq: Two times a day (BID) | ORAL | 4 refills | Status: DC

## 2017-06-05 MED ORDER — LACOSAMIDE 150 MG PO TABS: 2.0000 | ORAL_TABLET | Freq: Two times a day (BID) | ORAL | 4 refills | Status: DC
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
## 2017-08-13 ENCOUNTER — Ambulatory Visit: Payer: Medicaid Other | Admitting: Family Medicine

## 2017-09-26 IMAGING — CT CT HEAD W/O CM
3 series · 16 of 47 positions shown, 19 images · non-contrast
Comparison: 11/16/2016 CT head

CLINICAL DATA: 55 y/o  M; seizure with history of epilepsy.

EXAM:
CT HEAD WITHOUT CONTRAST
TECHNIQUE: Contiguous axial images were obtained from the base of the skull
through the vertex without intravenous contrast.

[Series 3: head 5.0 h30s · axial · 0.45mm/px · z∈[-91,+34]mm · 10 of 31 slices shown, 13 images]
[im 3/31  brain]
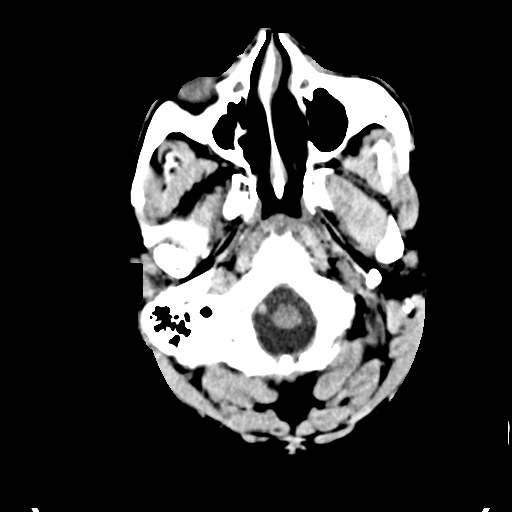
[im 3/31  bone]
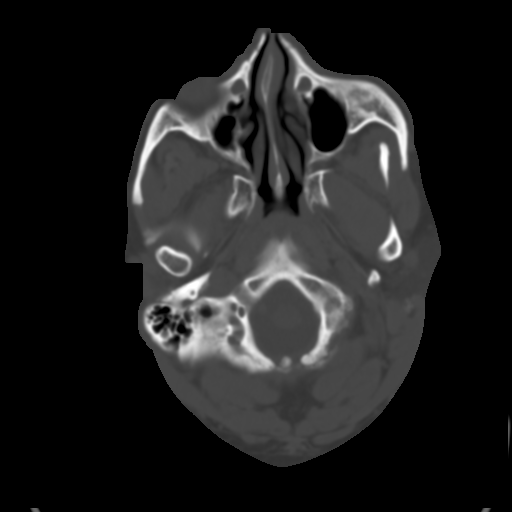
[im 6/31  brain]
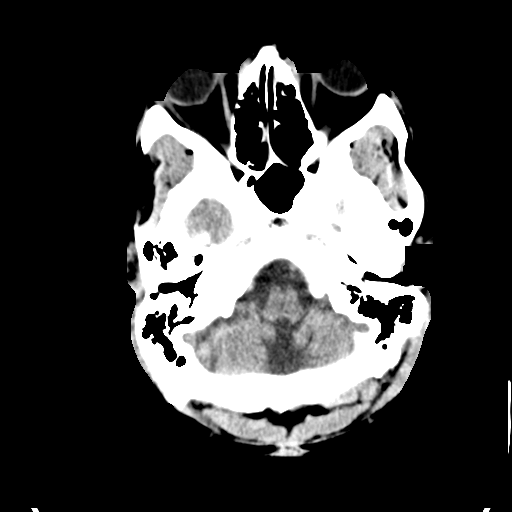
[im 9/31  brain]
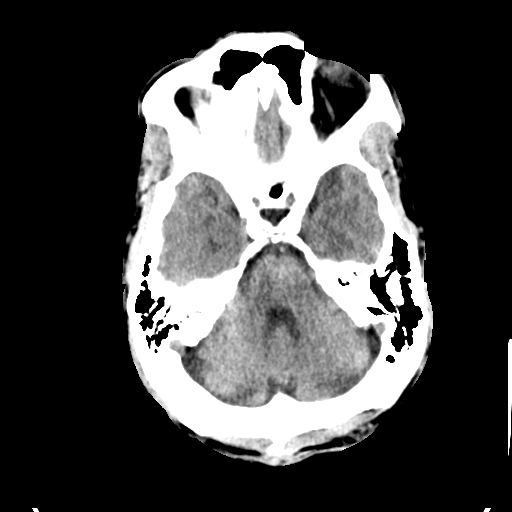
[im 11/31  brain]
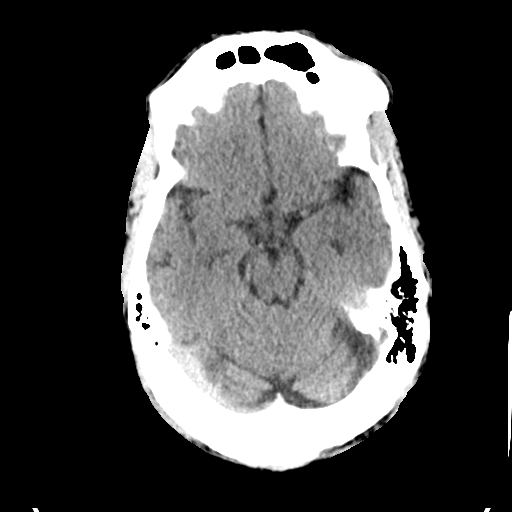
[im 14/31  brain]
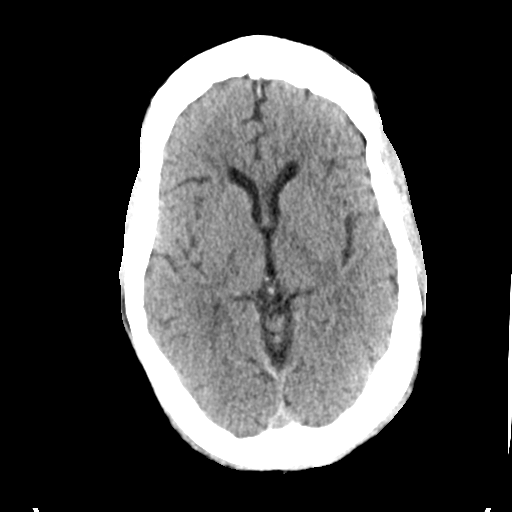
[im 14/31  bone]
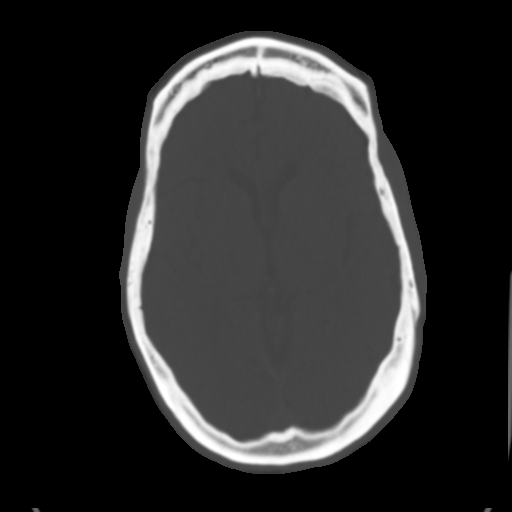
[im 17/31  brain]
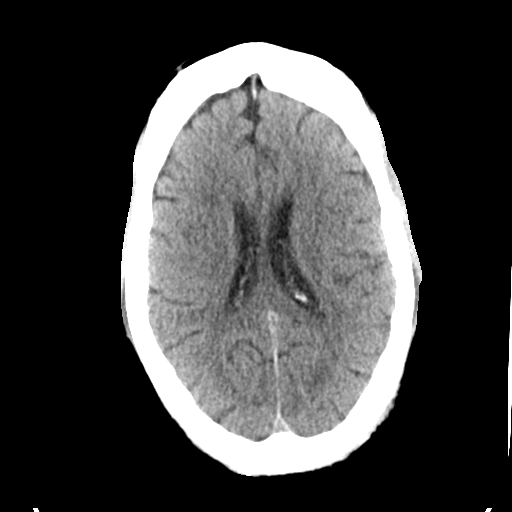
[im 20/31  brain]
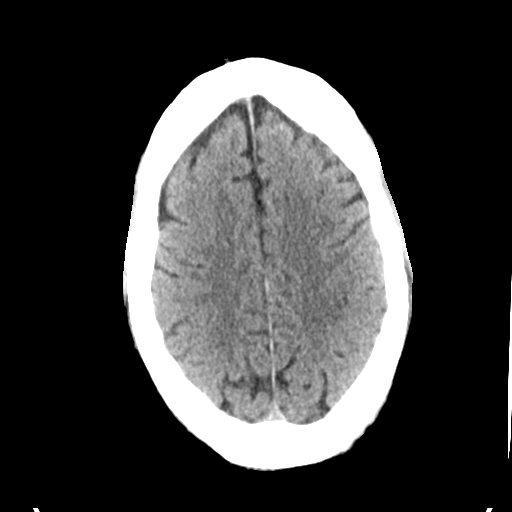
[im 23/31  brain]
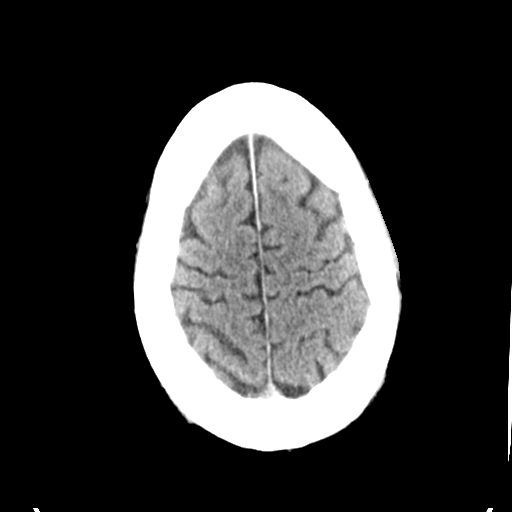
[im 25/31  brain]
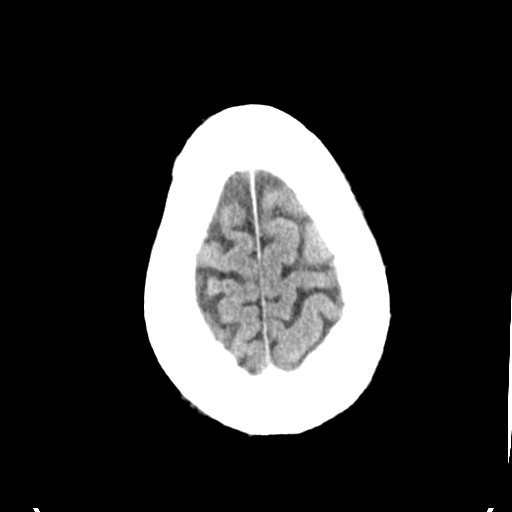
[im 25/31  bone]
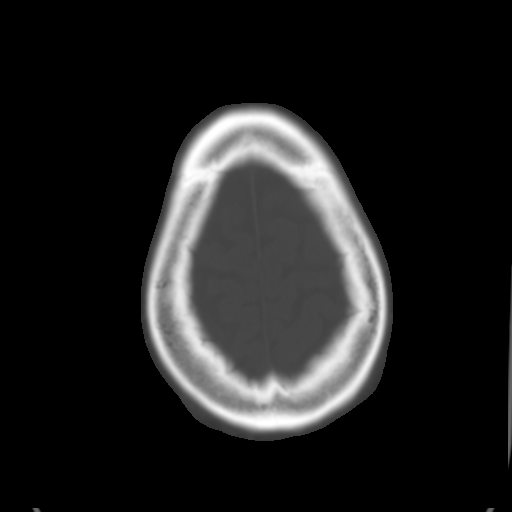
[im 28/31  brain]
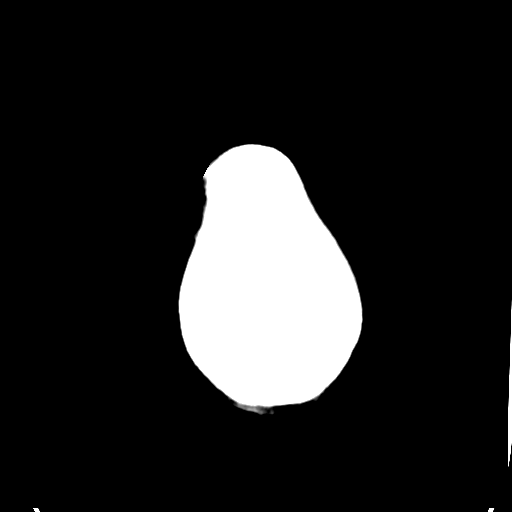

[Series 5: head 3.0 mpr cor · coronal · 0.32mm/px · 3 of 70 slices shown]
[im 24/70  brain]
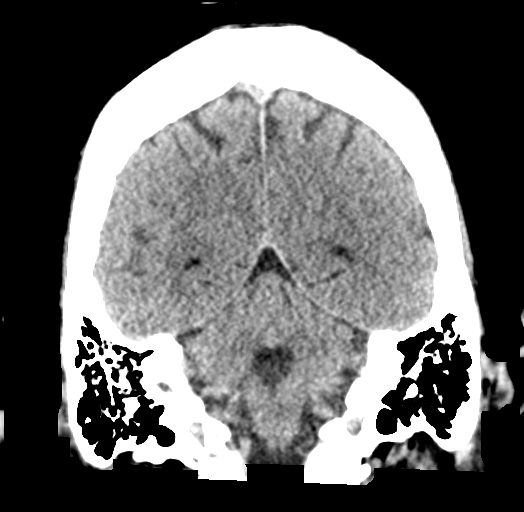
[im 31/70  brain]
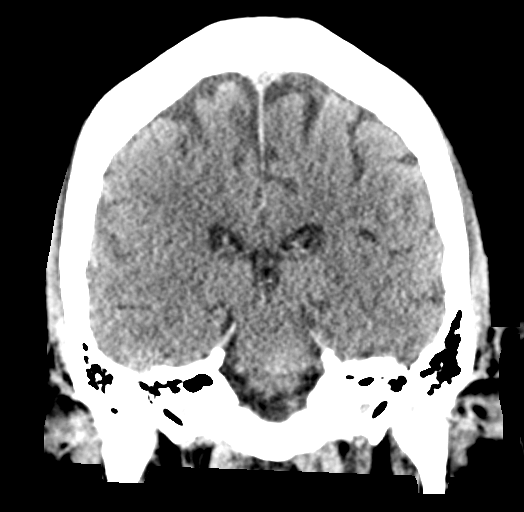
[im 39/70  brain]
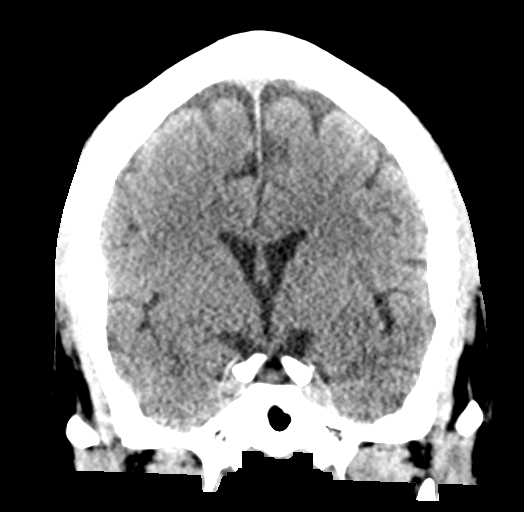

[Series 6: head 3.0 mpr sag · sagittal · 0.32mm/px · 3 of 52 slices shown]
[im 18/52  brain]
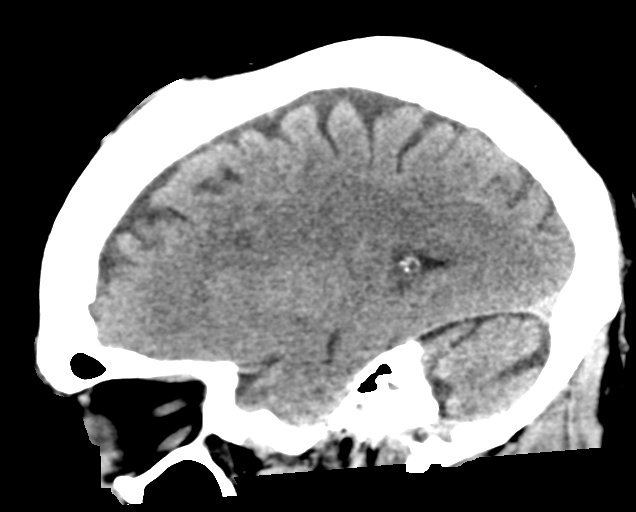
[im 26/52  brain]
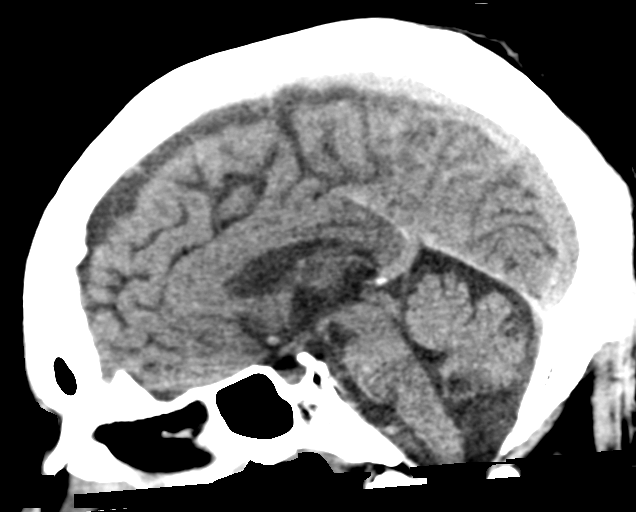
[im 35/52  brain]
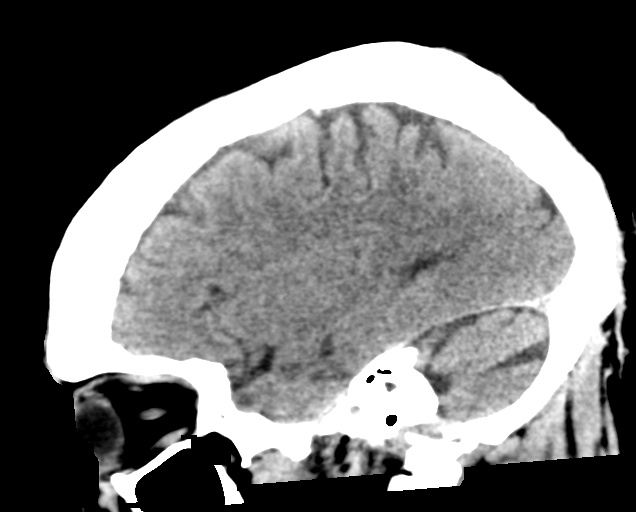

[16 of 47 positions shown; findings below may reference images not displayed]

FINDINGS: Brain: No evidence of acute infarction, hemorrhage, hydrocephalus,
extra-axial collection or mass lesion/mass effect. Mild parenchymal
volume loss including cerebellar volume loss which can be seen with
chronic seizure therapy.

Vascular: No hyperdense vessel or unexpected calcification.

Skull: Normal. Negative for acute fracture or focal lesion. Chronic
right nondisplaced nasal bone fracture.

Sinuses/Orbits: No acute finding.

Other: None.
IMPRESSION: 1. No acute intracranial abnormality identified.
2. Stable mild diffuse brain parenchymal volume loss.

By: Jose Carlitos Dias Dias M.D.

## 2017-10-03 ENCOUNTER — Emergency Department (HOSPITAL_COMMUNITY): Payer: Medicaid Other

## 2017-10-03 ENCOUNTER — Encounter (HOSPITAL_COMMUNITY): Payer: Self-pay | Admitting: Emergency Medicine

## 2017-10-03 ENCOUNTER — Other Ambulatory Visit: Payer: Self-pay

## 2017-10-03 ENCOUNTER — Emergency Department (HOSPITAL_COMMUNITY)
Admission: EM | Admit: 2017-10-03 | Discharge: 2017-10-03 | Disposition: A | Discharge status: Home / Self Care | Payer: Medicaid Other | Attending: Emergency Medicine | Admitting: Emergency Medicine

## 2017-10-03 DIAGNOSIS — Z79899 Other long term (current) drug therapy: Secondary | ICD-10-CM | POA: Diagnosis not present

## 2017-10-03 DIAGNOSIS — R569 Unspecified convulsions: Secondary | ICD-10-CM | POA: Diagnosis not present

## 2017-10-03 DIAGNOSIS — Z87891 Personal history of nicotine dependence: Secondary | ICD-10-CM | POA: Diagnosis not present

## 2017-10-03 DIAGNOSIS — I119 Hypertensive heart disease without heart failure: Secondary | ICD-10-CM | POA: Diagnosis not present

## 2017-10-03 DIAGNOSIS — G40909 Epilepsy, unspecified, not intractable, without status epilepticus: Secondary | ICD-10-CM | POA: Diagnosis not present

## 2017-10-03 DIAGNOSIS — M79604 Pain in right leg: Secondary | ICD-10-CM | POA: Diagnosis not present

## 2017-10-03 LAB — CBC WITH DIFFERENTIAL/PLATELET
BASOS PCT: 1 %
Basophils Absolute: 0 10*3/uL (ref 0.0–0.1)
Eosinophils Absolute: 0.1 10*3/uL (ref 0.0–0.7)
Eosinophils Relative: 1 %
HEMATOCRIT: 31 % — AB (ref 39.0–52.0)
HEMOGLOBIN: 9.7 g/dL — AB (ref 13.0–17.0)
LYMPHS ABS: 2.2 10*3/uL (ref 0.7–4.0)
Lymphocytes Relative: 55 %
MCH: 23.2 pg — AB (ref 26.0–34.0)
MCHC: 31.3 g/dL (ref 30.0–36.0)
MCV: 74 fL — ABNORMAL LOW (ref 78.0–100.0)
MONOS PCT: 11 %
Monocytes Absolute: 0.5 10*3/uL (ref 0.1–1.0)
NEUTROS ABS: 1.3 10*3/uL — AB (ref 1.7–7.7)
NEUTROS PCT: 32 %
Platelets: 210 10*3/uL (ref 150–400)
RBC: 4.19 MIL/uL — ABNORMAL LOW (ref 4.22–5.81)
RDW: 17.2 % — ABNORMAL HIGH (ref 11.5–15.5)
WBC: 4 10*3/uL (ref 4.0–10.5)

## 2017-10-03 LAB — BASIC METABOLIC PANEL
ANION GAP: 6 (ref 5–15)
BUN: 10 mg/dL (ref 6–20)
CO2: 29 mmol/L (ref 22–32)
Calcium: 9.8 mg/dL (ref 8.9–10.3)
Chloride: 103 mmol/L (ref 101–111)
Creatinine, Ser: 0.99 mg/dL (ref 0.61–1.24)
GFR calc Af Amer: >60 mL/min (ref >60)
GFR calc non Af Amer: >60 mL/min (ref >60)
GLUCOSE: 90 mg/dL (ref 65–99)
POTASSIUM: 4.2 mmol/L (ref 3.5–5.1)
Sodium: 138 mmol/L (ref 135–145)

## 2017-10-03 LAB — VALPROIC ACID LEVEL: Valproic Acid Lvl: 83 ug/mL (ref 50.0–100.0)

## 2017-10-03 MED ORDER — IBUPROFEN 800 MG PO TABS
800.0000 mg | ORAL_TABLET | Freq: Once | ORAL | Status: AC
  Administered 2017-10-03: 800 mg via ORAL
  Filled 2017-10-03: qty 1

## 2017-10-03 NOTE — Discharge Instructions (Signed)
You do not have broken bone on x-ray You have muscle bruising and soreness from fall Take ibuprofen and tylenol for pain, use heat packs Follow-up with your neurologist Return for worsening symptoms, including fever, confusion, leg swelling or any other symptoms concerning to you

## 2017-10-03 NOTE — ED Triage Notes (Addendum)
Pt states hx of epilepsy, had seizure Monday and fell and hurt his right leg. Pt states he takes SSRIs every day. Pt denies hitting his head but passed out and woke up with EMS but denies transport.

## 2017-10-03 NOTE — ED Notes (Signed)
Patient transported to X-ray 
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
## 2017-12-09 ENCOUNTER — Ambulatory Visit: Payer: Medicaid Other | Admitting: Adult Health

## 2017-12-10 ENCOUNTER — Telehealth: Payer: Self-pay | Admitting: Neurology

## 2017-12-10 NOTE — Telephone Encounter (Signed)
Pt called from [phone] unable to tell me if this was a new phone number that he could be reached at. FYI

## 2017-12-10 NOTE — Telephone Encounter (Signed)
Phone number updated in chart.

## 2018-01-08 ENCOUNTER — Other Ambulatory Visit: Payer: Self-pay | Admitting: *Deleted

## 2018-01-08 DIAGNOSIS — G40209 Localization-related (focal) (partial) symptomatic epilepsy and epileptic syndromes with complex partial seizures, not intractable, without status epilepticus: Secondary | ICD-10-CM

## 2018-01-08 MED ORDER — LACOSAMIDE 150 MG PO TABS: 2.0000 | ORAL_TABLET | Freq: Two times a day (BID) | ORAL | 1 refills | Status: DC

## 2018-01-08 NOTE — Telephone Encounter (Signed)
[facility] [phone].

## 2018-01-09 NOTE — Telephone Encounter (Signed)
Fax[facility] printed/signed [facility]amide to [facility]. Received fax confirmation.

## 2018-04-14 ENCOUNTER — Ambulatory Visit: Payer: Medicaid Other | Admitting: Adult Health

## 2018-05-04 ENCOUNTER — Other Ambulatory Visit: Payer: Self-pay | Admitting: Neurology

## 2018-05-04 DIAGNOSIS — G40209 Localization-related (focal) (partial) symptomatic epilepsy and epileptic syndromes with complex partial seizures, not intractable, without status epilepticus: Secondary | ICD-10-CM

## 2018-05-13 ENCOUNTER — Encounter: Payer: Self-pay | Admitting: Adult Health

## 2018-05-13 ENCOUNTER — Ambulatory Visit: Payer: Medicaid Other | Admitting: Adult Health

## 2018-05-13 VITALS — BP 117/73 | HR 70 | Ht 73.0 in | Wt 218.0 lb

## 2018-05-13 DIAGNOSIS — G40209 Localization-related (focal) (partial) symptomatic epilepsy and epileptic syndromes with complex partial seizures, not intractable, without status epilepticus: Secondary | ICD-10-CM

## 2018-05-13 DIAGNOSIS — G40119 Localization-related (focal) (partial) symptomatic epilepsy and epileptic syndromes with simple partial seizures, intractable, without status epilepticus: Secondary | ICD-10-CM | POA: Diagnosis not present

## 2018-05-13 DIAGNOSIS — Z5181 Encounter for therapeutic drug level monitoring: Secondary | ICD-10-CM

## 2018-05-13 MED ORDER — DIVALPROEX SODIUM ER 500 MG PO TB24: 1000.0000 mg | ORAL_TABLET | Freq: Two times a day (BID) | ORAL | 3 refills | Status: DC

## 2018-05-13 MED ORDER — LEVETIRACETAM 1000 MG PO TABS: 2000.0000 mg | ORAL_TABLET | Freq: Two times a day (BID) | ORAL | 4 refills | Status: DC

## 2018-05-13 MED ORDER — LACOSAMIDE 150 MG PO TABS: 2.0000 | ORAL_TABLET | Freq: Two times a day (BID) | ORAL | 1 refills | Status: DC
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
## 2018-05-13 NOTE — Patient Instructions (Addendum)
Your Plan:  Continue vimpat, keppra and depakote Blood work today If your symptoms worsen or you develop new symptoms please let us know.    Thank you for coming to see us at [facility]. I hope we have been able to provide you high quality care today.  You may receive a patient satisfaction survey over the next few weeks. We would appreciate your feedback and comments so that we may continue to improve ourselves and the health of our patients.

## 2018-05-15 LAB — CBC WITH DIFFERENTIAL/PLATELET
BASOS ABS: 0 10*3/uL (ref 0.0–0.2)
Basos: 1 %
EOS (ABSOLUTE): 0.1 10*3/uL (ref 0.0–0.4)
Eos: 2 %
Hematocrit: 38.3 % (ref 37.5–51.0)
Hemoglobin: 11.4 g/dL — ABNORMAL LOW (ref 13.0–17.7)
Immature Grans (Abs): 0 10*3/uL (ref 0.0–0.1)
Immature Granulocytes: 0 %
LYMPHS ABS: 1.2 10*3/uL (ref 0.7–3.1)
LYMPHS: 50 %
MCH: 23.6 pg — AB (ref 26.6–33.0)
MCHC: 29.8 g/dL — AB (ref 31.5–35.7)
MCV: 79 fL (ref 79–97)
MONOCYTES: 10 %
MONOS ABS: 0.2 10*3/uL (ref 0.1–0.9)
NEUTROS ABS: 0.9 10*3/uL — AB (ref 1.4–7.0)
Neutrophils: 37 %
PLATELETS: 135 10*3/uL — AB (ref 150–450)
RBC: 4.84 x10E6/uL (ref 4.14–5.80)
RDW: 17.1 % — AB (ref 12.3–15.4)
WBC: 2.4 10*3/uL — AB (ref 3.4–10.8)

## 2018-05-15 LAB — LEVETIRACETAM LEVEL: Levetiracetam Lvl: 50.1 ug/mL — ABNORMAL HIGH (ref 10.0–40.0)

## 2018-05-15 LAB — COMPREHENSIVE METABOLIC PANEL
ALT: 24 IU/L (ref 0–44)
AST: 25 IU/L (ref 0–40)
Albumin/Globulin Ratio: 1.1 — ABNORMAL LOW (ref 1.2–2.2)
Albumin: 4.1 g/dL (ref 3.5–5.5)
Alkaline Phosphatase: 54 IU/L (ref 39–117)
BILIRUBIN TOTAL: 0.5 mg/dL (ref 0.0–1.2)
BUN/Creatinine Ratio: 10 (ref 9–20)
BUN: 10 mg/dL (ref 6–24)
CHLORIDE: 104 mmol/L (ref 96–106)
CO2: 24 mmol/L (ref 20–29)
CREATININE: 1 mg/dL (ref 0.76–1.27)
Calcium: 10.2 mg/dL (ref 8.7–10.2)
GFR calc non Af Amer: 85 mL/min/{1.73_m2} (ref >59)
GFR, EST AFRICAN AMERICAN: 98 mL/min/{1.73_m2} (ref >59)
GLUCOSE: 83 mg/dL (ref 65–99)
Globulin, Total: 3.6 g/dL (ref 1.5–4.5)
Potassium: 4.4 mmol/L (ref 3.5–5.2)
Sodium: 140 mmol/L (ref 134–144)
TOTAL PROTEIN: 7.7 g/dL (ref 6.0–8.5)

## 2018-05-15 LAB — VALPROIC ACID LEVEL: Valproic Acid Lvl: 79 ug/mL (ref 50–100)

## 2018-05-15 LAB — LACOSAMIDE: Lacosamide: 23.6 ug/mL — ABNORMAL HIGH (ref 5.0–10.0)

## 2018-05-15 NOTE — Progress Notes (Signed)
I have reviewed and agreed above plan. 

## 2018-05-20 ENCOUNTER — Other Ambulatory Visit: Payer: Self-pay | Admitting: *Deleted

## 2018-05-20 ENCOUNTER — Telehealth: Payer: Self-pay | Admitting: Neurology

## 2018-05-20 DIAGNOSIS — R569 Unspecified convulsions: Secondary | ICD-10-CM

## 2018-05-20 DIAGNOSIS — G40301 Generalized idiopathic epilepsy and epileptic syndromes, not intractable, with status epilepticus: Secondary | ICD-10-CM

## 2018-05-20 DIAGNOSIS — Z79899 Other long term (current) drug therapy: Secondary | ICD-10-CM

## 2018-05-20 NOTE — Telephone Encounter (Signed)
Please call patient, lab showed decreased wbc 2.4, Hg 11.4, mild elevated keppra 50, Depakote level of 79, lacosamide level 23.6,  The elevated antiepileptic levels could due to timing of the blood sample and last dose of medications.  Check to see if he has any signs of toxicity, dizziness, unsteady gait,   He needs to return in 2 weeks for repeat CBC, order placed.

## 2018-05-20 NOTE — Telephone Encounter (Signed)
Dr. Terrace ArabiaYan reviewed these results in Megan's absence from the office.  I called the number listed in the patient's chart [phone]).  No answer and voicemail has not been set up.  His note from June 05 2017 indicated he was living at [facility] [place] [phone]).  I attempted to see if he was still a resident at this location.  No answer and message was left requesting a call back.  His last note on 05/13/18 stated he is now living in a group home but we are unsure of the facility name.

## 2018-05-21 NOTE — Telephone Encounter (Addendum)
Called [facility],  [phone], got  no answer, LVM requesting call back. Called patient's number,  [phone], and he answered. He gave this RN a number for the lady in the group home he called Ms Earlene [facility]Davis, [phone]. Called Ms Earlene PlaterDavis' number twice; phone immediately rang busy both times.   Will try to reach later.

## 2018-05-22 NOTE — Telephone Encounter (Addendum)
Called number patient gave for MS Seth Brock; immediately rang busy. Called [facility] and got voice mail. Requested a call back today for important information regarding one of their residents. Left number and advised our office closes at noon.

## 2018-05-25 NOTE — Telephone Encounter (Signed)
LVM for sister, Bethann Berkshirerisha and requested she call back. Advised her this RN has been unable to reach anyone regarding patient's labs and medication.

## 2018-05-27 ENCOUNTER — Encounter: Payer: Self-pay | Admitting: *Deleted

## 2018-05-27 NOTE — Telephone Encounter (Signed)
Patient's DPR lists his sister, Bethann Berkshirerisha but does not include her address to send a letter. Called [place] Domixie Matthews, listed as caretaker on DPR dated 12/19/16. She stated she is no longer his caretaker, and she does not know where he is living.

## 2018-05-27 NOTE — Telephone Encounter (Signed)
The patient came to the office visit alone.  He was unsure of the name of his group home.  Could be potentially mail a letter to his sister asking her to give us a call?

## 2018-05-28 NOTE — Telephone Encounter (Signed)
Is his PCP on epic? If not we can call them to see if they have it listed.

## 2018-05-28 NOTE — Telephone Encounter (Signed)
thanks

## 2018-05-28 NOTE — Telephone Encounter (Signed)
Received call from patient's sister, [place]ann [place]isha on [place].  This RN advised her we are trying to reach him about his lab results and the need for repeat labs.She stated she has not seen her brother in about two years. She stated she has been undergoing surgery and extensive medical treatment herself. She stated ever since their mother passed away three years ago her brother has been living in several places that she secured for him. The last she knew he was homeless again. She gave this RN new contact numbers for herself and requested to be called if he comes back into the office to be seen. She stated she did not have a permanent address for herself to provide for record. She verbalzied understanding of why this RN was trying to reach her. Will inform providers in this office.

## 2018-05-31 ENCOUNTER — Other Ambulatory Visit: Payer: Self-pay | Admitting: Neurology

## 2018-05-31 DIAGNOSIS — G40209 Localization-related (focal) (partial) symptomatic epilepsy and epileptic syndromes with complex partial seizures, not intractable, without status epilepticus: Secondary | ICD-10-CM

## 2018-06-02 ENCOUNTER — Other Ambulatory Visit: Payer: Self-pay | Admitting: Neurology

## 2018-06-02 DIAGNOSIS — G40209 Localization-related (focal) (partial) symptomatic epilepsy and epileptic syndromes with complex partial seizures, not intractable, without status epilepticus: Secondary | ICD-10-CM

## 2018-06-05 ENCOUNTER — Other Ambulatory Visit: Payer: Self-pay | Admitting: Neurology

## 2018-06-05 DIAGNOSIS — G40209 Localization-related (focal) (partial) symptomatic epilepsy and epileptic syndromes with complex partial seizures, not intractable, without status epilepticus: Secondary | ICD-10-CM

## 2018-06-11 ENCOUNTER — Other Ambulatory Visit: Payer: Self-pay | Admitting: Neurology

## 2018-06-11 DIAGNOSIS — G40209 Localization-related (focal) (partial) symptomatic epilepsy and epileptic syndromes with complex partial seizures, not intractable, without status epilepticus: Secondary | ICD-10-CM

## 2018-06-23 ENCOUNTER — Other Ambulatory Visit: Payer: Self-pay | Admitting: Neurology

## 2018-06-23 DIAGNOSIS — G40209 Localization-related (focal) (partial) symptomatic epilepsy and epileptic syndromes with complex partial seizures, not intractable, without status epilepticus: Secondary | ICD-10-CM

## 2018-06-29 ENCOUNTER — Other Ambulatory Visit: Payer: Self-pay | Admitting: Neurology

## 2018-06-29 DIAGNOSIS — G40209 Localization-related (focal) (partial) symptomatic epilepsy and epileptic syndromes with complex partial seizures, not intractable, without status epilepticus: Secondary | ICD-10-CM

## 2018-07-24 ENCOUNTER — Other Ambulatory Visit: Payer: Self-pay | Admitting: Neurology

## 2018-07-24 DIAGNOSIS — G40209 Localization-related (focal) (partial) symptomatic epilepsy and epileptic syndromes with complex partial seizures, not intractable, without status epilepticus: Secondary | ICD-10-CM

## 2018-08-22 ENCOUNTER — Emergency Department (HOSPITAL_COMMUNITY)
Admission: EM | Admit: 2018-08-22 | Discharge: 2018-08-22 | Disposition: A | Discharge status: Home / Self Care | Payer: Medicaid Other | Attending: Emergency Medicine | Admitting: Emergency Medicine

## 2018-08-22 ENCOUNTER — Encounter (HOSPITAL_COMMUNITY): Payer: Self-pay

## 2018-08-22 DIAGNOSIS — I10 Essential (primary) hypertension: Secondary | ICD-10-CM | POA: Insufficient documentation

## 2018-08-22 DIAGNOSIS — Z87891 Personal history of nicotine dependence: Secondary | ICD-10-CM | POA: Diagnosis not present

## 2018-08-22 DIAGNOSIS — F79 Unspecified intellectual disabilities: Secondary | ICD-10-CM | POA: Insufficient documentation

## 2018-08-22 DIAGNOSIS — G40909 Epilepsy, unspecified, not intractable, without status epilepticus: Secondary | ICD-10-CM | POA: Diagnosis present

## 2018-08-22 DIAGNOSIS — R569 Unspecified convulsions: Secondary | ICD-10-CM

## 2018-08-22 DIAGNOSIS — Z79899 Other long term (current) drug therapy: Secondary | ICD-10-CM | POA: Diagnosis not present

## 2018-08-22 LAB — CBC WITH DIFFERENTIAL/PLATELET
ABS IMMATURE GRANULOCYTES: 0 10*3/uL (ref 0.00–0.07)
Basophils Absolute: 0.1 10*3/uL (ref 0.0–0.1)
Basophils Relative: 1 %
EOS ABS: 0.1 10*3/uL (ref 0.0–0.5)
Eosinophils Relative: 1 %
HEMATOCRIT: 43.8 % (ref 39.0–52.0)
Hemoglobin: 13.3 g/dL (ref 13.0–17.0)
IMMATURE GRANULOCYTES: 0 %
Lymphocytes Relative: 35 %
Lymphs Abs: 1.8 10*3/uL (ref 0.7–4.0)
MCH: 23.3 pg — ABNORMAL LOW (ref 26.0–34.0)
MCHC: 30.4 g/dL (ref 30.0–36.0)
MCV: 76.7 fL — AB (ref 80.0–100.0)
Monocytes Absolute: 0.3 10*3/uL (ref 0.1–1.0)
Monocytes Relative: 7 %
NEUTROS PCT: 56 %
Neutro Abs: 2.9 10*3/uL (ref 1.7–7.7)
Platelets: 211 10*3/uL (ref 150–400)
RBC: 5.71 MIL/uL (ref 4.22–5.81)
RDW: 16.2 % — AB (ref 11.5–15.5)
WBC: 5.2 10*3/uL (ref 4.0–10.5)
nRBC: 0 % (ref 0.0–0.2)

## 2018-08-22 LAB — COMPREHENSIVE METABOLIC PANEL
ALBUMIN: 3.8 g/dL (ref 3.5–5.0)
ALK PHOS: 44 U/L (ref 38–126)
ALT: 16 U/L (ref 0–44)
ANION GAP: 6 (ref 5–15)
AST: 51 U/L — ABNORMAL HIGH (ref 15–41)
BUN: 12 mg/dL (ref 6–20)
CALCIUM: 10 mg/dL (ref 8.9–10.3)
CO2: 24 mmol/L (ref 22–32)
Chloride: 108 mmol/L (ref 98–111)
Creatinine, Ser: 0.93 mg/dL (ref 0.61–1.24)
GFR calc non Af Amer: >60 mL/min (ref >60)
GLUCOSE: 71 mg/dL (ref 70–99)
Potassium: 5.7 mmol/L — ABNORMAL HIGH (ref 3.5–5.1)
Sodium: 138 mmol/L (ref 135–145)
TOTAL PROTEIN: 7.7 g/dL (ref 6.5–8.1)
Total Bilirubin: 1.7 mg/dL — ABNORMAL HIGH (ref 0.3–1.2)

## 2018-08-22 LAB — VALPROIC ACID LEVEL: Valproic Acid Lvl: 81 ug/mL (ref 50.0–100.0)

## 2018-08-22 LAB — I-STAT TROPONIN, ED: Troponin i, poc: 0 ng/mL (ref 0.00–0.08)

## 2018-08-22 LAB — MAGNESIUM: Magnesium: 2 mg/dL (ref 1.7–2.4)

## 2018-08-22 LAB — CBG MONITORING, ED: GLUCOSE-CAPILLARY: 75 mg/dL (ref 70–99)

## 2018-08-22 MED ORDER — LEVETIRACETAM 500 MG PO TABS
2000.0000 mg | ORAL_TABLET | Freq: Once | ORAL | Status: AC
  Administered 2018-08-22: 2000 mg via ORAL
  Filled 2018-08-22: qty 4

## 2018-08-22 MED ORDER — LACOSAMIDE 50 MG PO TABS
150.0000 mg | ORAL_TABLET | Freq: Two times a day (BID) | ORAL | Status: DC
  Administered 2018-08-22: 150 mg via ORAL
  Filled 2018-08-22: qty 3

## 2018-08-22 NOTE — Care Management (Signed)
[employer]PS was able to able to go to the address and locate an adult at the home. Will transport patient by [employer] taxi voucher given.
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
## 2018-08-22 NOTE — Care Management (Addendum)
ED CM attempting to locate patient's address, patient states he can't remember just move there, and sister who is emergency contact does not know the address either. CM contacted CSW AD for assistance  awaiting a return call .

## 2018-08-22 NOTE — ED Triage Notes (Signed)
Pt w/ friend at [employer] when he fell into shelving and had full body seizure activity for several minutes; Hx seizures, prescribed Depakote, kepra, and phenobarbital; pt missed seizure med this am (states "the lady that brings it didn't come this morning); a flutter on monitor; friend held pt's head; pt A and O x 4 at this time; speech slurred w/ ems; pt has a vagal nerve stimulator  130 palp HR 72 CBG 84 99% RA CGS 14

## 2018-08-22 NOTE — Care Management (Signed)
Patient was able to remember house address and identify house on google maps  [address] [place]. CM will send patient home by [facility]. Verified with patient that he has keys to home.  No further ED CM needs identified.

## 2018-08-22 NOTE — Care Management (Addendum)
ED CM discussed with [employer] off duty office who will initiate a wellness check.

## 2018-08-22 NOTE — ED Notes (Signed)
Patient verbalizes understanding of discharge instructions. Opportunity for questioning and answers were provided. Pt discharged from ED. 

## 2018-09-28 ENCOUNTER — Ambulatory Visit (INDEPENDENT_AMBULATORY_CARE_PROVIDER_SITE_OTHER): Payer: Medicaid Other | Admitting: Physician Assistant

## 2018-09-28 ENCOUNTER — Encounter (INDEPENDENT_AMBULATORY_CARE_PROVIDER_SITE_OTHER): Payer: Self-pay | Admitting: Physician Assistant

## 2018-09-28 ENCOUNTER — Other Ambulatory Visit: Payer: Self-pay

## 2018-09-28 VITALS — BP 124/79 | HR 82 | Temp 97.8°F | Ht 74.0 in | Wt 202.2 lb

## 2018-09-28 DIAGNOSIS — Z23 Encounter for immunization: Secondary | ICD-10-CM | POA: Diagnosis not present

## 2018-09-28 DIAGNOSIS — Z79899 Other long term (current) drug therapy: Secondary | ICD-10-CM | POA: Diagnosis not present

## 2018-09-28 DIAGNOSIS — R569 Unspecified convulsions: Secondary | ICD-10-CM | POA: Diagnosis not present

## 2018-09-28 DIAGNOSIS — Z131 Encounter for screening for diabetes mellitus: Secondary | ICD-10-CM | POA: Diagnosis not present

## 2018-09-28 DIAGNOSIS — I10 Essential (primary) hypertension: Secondary | ICD-10-CM | POA: Diagnosis not present

## 2018-09-28 DIAGNOSIS — Z125 Encounter for screening for malignant neoplasm of prostate: Secondary | ICD-10-CM

## 2018-09-28 DIAGNOSIS — Z1211 Encounter for screening for malignant neoplasm of colon: Secondary | ICD-10-CM

## 2018-09-28 LAB — POCT GLYCOSYLATED HEMOGLOBIN (HGB A1C): HEMOGLOBIN A1C: 5.3 % (ref 4.0–5.6)

## 2018-09-28 NOTE — Patient Instructions (Signed)
Seizure, Adult °When you have a seizure: °· Parts of your body may move. °· How aware or awake (conscious) you are may change. °· You may shake (convulse). ° °Some people have symptoms right before a seizure happens. These symptoms may include: °· Fear. °· Worry (anxiety). °· Feeling like you are going to throw up (nausea). °· Feeling like the room is spinning (vertigo). °· Feeling like you saw or heard something before (deja vu). °· Odd tastes or smells. °· Changes in vision, such as seeing flashing lights or spots. ° °Seizures usually last from 30 seconds to 2 minutes. Usually, they are not harmful unless they last a long time. °Follow these instructions at home: °Medicines °· Take over-the-counter and prescription medicines only as told by your doctor. °· Avoid anything that may keep your medicine from working, such as alcohol. °Activity °· Do not do any activities that would be dangerous if you had another seizure, like driving or swimming. Wait until your doctor approves. °· If you live in the [place] ask your local DMV (department of motor vehicles) when you can drive. °· Rest. °Teaching others °· Teach friends and family what to do when you have a seizure. They should: °? Lay you on the ground. °? Protect your head and body. °? Loosen any tight clothing around your neck. °? Turn you on your side. °? Stay with you until you are better. °? Not hold you down. °? Not put anything in your mouth. °? Know whether or not you need emergency care. °General instructions °· Contact your doctor each time you have a seizure. °· Avoid anything that gives you seizures. °· Keep a seizure diary. Write down: °? What you think caused each seizure. °? What you remember about each seizure. °· Keep all follow-up visits as told by your doctor. This is important. °Contact a doctor if: °· You have another seizure. °· You have seizures more often. °· There is any change in what happens during your seizures. °· You continue to have  seizures with treatment. °· You have symptoms of being sick or having an infection. °Get help right away if: °· You have a seizure: °? That lasts longer than 5 minutes. °? That is different than seizures you had before. °? That makes it harder to breathe. °? After you hurt your head. °· After a seizure, you cannot speak or use a part of your body. °· After a seizure, you are confused or have a bad headache. °· You have two or more seizures in a row. °· You are having seizures more often. °· You do not wake up right after a seizure. °· You get hurt during a seizure. °In an emergency: °· These symptoms may be an emergency. Do not wait to see if the symptoms will go away. Get medical help right away. Call your local emergency services (911 in the [place] Do not drive yourself to the hospital. °This information is not intended to replace advice given to you by your health care provider. Make sure you discuss any questions you have with your health care provider. °Document Released: 03/25/2008 Document Revised: 06/19/2016 Document Reviewed: 06/19/2016 °Elsevier Interactive Patient Education © 2017 Elsevier Inc. ° °

## 2018-09-28 NOTE — Progress Notes (Signed)
Subjective:  Patient ID: Seth BurkeLionel Brock, male    DOB: [birth year]  Age: 57 y.o. MRN: [id number]  CC: establish  HPI Seth Brock is a 57 y.o. male with a medical history of CAD, epilepsy, HTN, HLD, paroxysmal Afib, and mental retardation presents as a new pt for general management. States he is feeling well and does not endorse any symptom whatsoever. Last seen by neurology on 05/13/18. Advised to continue on Vimpat, Keppra, and Depakote. Due for f/u with neurology on 11/23/18. Presents a prescription with orders for CBC, CMP, PSA, hepatitis panel, A1c, thyroid panel, renal profile, UA, Dilantin level, Keppra level, depakote level, flu vaccination, and Pneumovax vaccination written by Cecilie Lowersynthia W. Mclemore, M.D..        Outpatient Medications Prior to Visit  Medication Sig Dispense Refill  . amLODipine (NORVASC) 5 MG tablet Take 1 tablet (5 mg total) by mouth daily. 90 tablet 3  . atorvastatin (LIPITOR) 20 MG tablet Take 1 tablet (20 mg total) by mouth daily. 90 tablet 3  . benzocaine (ORAJEL) 10 % mucosal gel Use as directed 1 application in the mouth or throat as needed for mouth pain. 5.3 g 0  . diltiazem (DILTIAZEM CD) 240 MG 24 hr capsule Take 1 capsule (240 mg total) by mouth daily. 90 capsule 3  . divalproex (DEPAKOTE ER) 500 MG 24 hr tablet Take 2 tablets (1,000 mg total) by mouth 2 (two) times daily. 360 tablet 3  . Lacosamide (VIMPAT) 150 MG TABS Take 2 tablets (300 mg total) by mouth 2 (two) times daily. 360 tablet 1  . levETIRAcetam (KEPPRA) 1000 MG tablet Take 2 tablets (2,000 mg total) by mouth 2 (two) times daily. 360 tablet 4  . LORazepam (ATIVAN) 1 MG tablet Take 1 tablet (1 mg total) by mouth 2 (two) times daily as needed for anxiety or seizure. 4 tablet 0  . naproxen (NAPROSYN) 500 MG tablet Take 1 tablet (500 mg total) by mouth 2 (two) times daily with a meal. 60 tablet 0  . omega-3 acid ethyl esters (LOVAZA) 1 g capsule Take 1 capsule (1 g total) by mouth 2 (two) times daily. 60  capsule 3  . ranitidine (ZANTAC) 150 MG tablet Take 1 tablet (150 mg total) by mouth 2 (two) times daily. 60 tablet 0  . senna-docusate (SENOKOT-S) 8.6-50 MG tablet Take 1 tablet by mouth daily.     No facility-administered medications prior to visit.      ROS Review of Systems  Constitutional: Negative for chills, fever and malaise/fatigue.  Eyes: Negative for blurred vision.  Respiratory: Negative for shortness of breath.   Cardiovascular: Negative for chest pain and palpitations.  Gastrointestinal: Negative for abdominal pain and nausea.  Genitourinary: Negative for dysuria and hematuria.  Musculoskeletal: Negative for joint pain and myalgias.  Skin: Negative for rash.  Neurological: Negative for tingling and headaches.  Psychiatric/Behavioral: Negative for depression. The patient is not nervous/anxious.     Objective:  BP 124/79 (BP Location: Right Arm, Patient Position: Sitting, Cuff Size: Large)   Pulse 82   Temp 97.8 F (36.6 C) (Oral)   Ht 6\' 2"  (1.88 m)   Wt 202 lb 3.2 oz (91.7 kg)   SpO2 100%   BMI 25.96 kg/m   BP/Weight 09/28/2018 08/22/2018 05/13/2018  Systolic BP 124 146 117  Diastolic BP 79 91 73  Wt. (Lbs) 202.2 218.04 218  BMI 25.96 27.99 28.76      Physical Exam  Constitutional: He is oriented to person, place, and  time.  Well developed, well nourished, NAD, polite, intellectual disability  HENT:  Head: Normocephalic and atraumatic.  Eyes: No scleral icterus.  Neck: Normal range of motion. Neck supple. No thyromegaly present.  Cardiovascular: Normal rate, regular rhythm and normal heart sounds.  No LE edema bilaterally  Pulmonary/Chest: Effort normal and breath sounds normal.  Musculoskeletal: He exhibits no edema.  Neurological: He is alert and oriented to person, place, and time.  Skin: Skin is warm and dry. No rash noted. No erythema. No pallor.  Psychiatric: He has a normal mood and affect. His behavior is normal. Thought content normal.   Vitals reviewed.    Assessment & Plan:    1. Essential hypertension - CBC with Differential - Comprehensive metabolic panel - Thyroid Panel With TSH  2. Seizures (HCC) - Continue Keppra, Vimpat, and Depakote as directed by neurology.   3. Special screening for malignant neoplasms, colon - Flu Vaccine QUAD 6+ mos PF IM (Fluarix Quad PF)  4. Need for prophylactic vaccination and inoculation against influenza - Fecal occult blood, imunochemical  5. Screening for diabetes mellitus - HgB A1c  6. High risk medication use - Phenytoin level, free and total - Levetiracetam level - Valproic acid level  7. Screening for prostate cancer - PSA     Follow-up: Return in about 4 months (around 01/28/2019) for HTN .   Loletta Specter PA

## 2018-10-01 ENCOUNTER — Other Ambulatory Visit (INDEPENDENT_AMBULATORY_CARE_PROVIDER_SITE_OTHER): Payer: Self-pay | Admitting: Physician Assistant

## 2018-10-01 DIAGNOSIS — D649 Anemia, unspecified: Secondary | ICD-10-CM

## 2018-10-01 DIAGNOSIS — D696 Thrombocytopenia, unspecified: Secondary | ICD-10-CM

## 2018-10-01 LAB — COMPREHENSIVE METABOLIC PANEL
A/G RATIO: 1.3 (ref 1.2–2.2)
ALK PHOS: 61 IU/L (ref 39–117)
ALT: 27 IU/L (ref 0–44)
AST: 42 IU/L — AB (ref 0–40)
Albumin: 4.5 g/dL (ref 3.5–5.5)
BILIRUBIN TOTAL: 0.3 mg/dL (ref 0.0–1.2)
BUN/Creatinine Ratio: 14 (ref 9–20)
BUN: 10 mg/dL (ref 6–24)
CHLORIDE: 100 mmol/L (ref 96–106)
CO2: 21 mmol/L (ref 20–29)
Calcium: 10.6 mg/dL — ABNORMAL HIGH (ref 8.7–10.2)
Creatinine, Ser: 0.74 mg/dL — ABNORMAL LOW (ref 0.76–1.27)
GFR calc Af Amer: 121 mL/min/{1.73_m2} (ref >59)
GFR calc non Af Amer: 105 mL/min/{1.73_m2} (ref >59)
Globulin, Total: 3.4 g/dL (ref 1.5–4.5)
Glucose: 75 mg/dL (ref 65–99)
POTASSIUM: 4.3 mmol/L (ref 3.5–5.2)
SODIUM: 141 mmol/L (ref 134–144)
Total Protein: 7.9 g/dL (ref 6.0–8.5)

## 2018-10-01 LAB — VALPROIC ACID LEVEL: Valproic Acid Lvl: 88 ug/mL (ref 50–100)

## 2018-10-01 LAB — THYROID PANEL WITH TSH
Free Thyroxine Index: 2.1 (ref 1.2–4.9)
T3 UPTAKE RATIO: 30 % (ref 24–39)
T4 TOTAL: 7.1 ug/dL (ref 4.5–12.0)
TSH: 1.46 u[IU]/mL (ref 0.450–4.500)

## 2018-10-01 LAB — CBC WITH DIFFERENTIAL/PLATELET
BASOS ABS: 0.1 10*3/uL (ref 0.0–0.2)
Basos: 1 %
EOS (ABSOLUTE): 0.1 10*3/uL (ref 0.0–0.4)
Eos: 4 %
Hematocrit: 41 % (ref 37.5–51.0)
Hemoglobin: 12.7 g/dL — ABNORMAL LOW (ref 13.0–17.7)
Immature Grans (Abs): 0 10*3/uL (ref 0.0–0.1)
Immature Granulocytes: 0 %
Lymphocytes Absolute: 1.8 10*3/uL (ref 0.7–3.1)
Lymphs: 47 %
MCH: 23.9 pg — ABNORMAL LOW (ref 26.6–33.0)
MCHC: 31 g/dL — AB (ref 31.5–35.7)
MCV: 77 fL — AB (ref 79–97)
MONOS ABS: 0.3 10*3/uL (ref 0.1–0.9)
Monocytes: 8 %
NEUTROS ABS: 1.6 10*3/uL (ref 1.4–7.0)
NEUTROS PCT: 40 %
PLATELETS: 96 10*3/uL — AB (ref 150–450)
RBC: 5.32 x10E6/uL (ref 4.14–5.80)
RDW: 18.2 % — AB (ref 12.3–15.4)
WBC: 3.9 10*3/uL (ref 3.4–10.8)

## 2018-10-01 LAB — LEVETIRACETAM LEVEL: Levetiracetam Lvl: 61.3 ug/mL — ABNORMAL HIGH (ref 10.0–40.0)

## 2018-10-01 LAB — PHENYTOIN LEVEL, FREE AND TOTAL
Phenytoin, Free: NOT DETECTED ug/mL (ref 1.0–2.0)
Phenytoin, Serum: <0.8 ug/mL — ABNORMAL LOW (ref 10.0–20.0)

## 2018-10-01 LAB — PSA: PROSTATE SPECIFIC AG, SERUM: 1.1 ng/mL (ref 0.0–4.0)

## 2018-10-02 ENCOUNTER — Telehealth (INDEPENDENT_AMBULATORY_CARE_PROVIDER_SITE_OTHER): Payer: Self-pay

## 2018-10-02 NOTE — Telephone Encounter (Signed)
Patient is aware that platelets are low and mildly anemic. Repeat CBC in one to two weeks. All other labs normal. Seth Brock, CMA

## 2018-10-02 NOTE — Telephone Encounter (Signed)
-----   Message from Loletta Specteroger David Gomez, PA-C sent at 10/02/2018  8:31 AM EST ----- Platelets low and mildly anemic. I have ordered a repeat CBC to be done in one to two weeks. Rest of labs are unremarkable.

## 2018-11-23 ENCOUNTER — Encounter: Payer: Self-pay | Admitting: Adult Health

## 2018-11-23 ENCOUNTER — Ambulatory Visit: Payer: Medicaid Other | Admitting: Adult Health

## 2018-11-23 VITALS — BP 114/72 | HR 79 | Ht 74.0 in | Wt 194.4 lb

## 2018-11-23 DIAGNOSIS — R569 Unspecified convulsions: Secondary | ICD-10-CM | POA: Diagnosis not present

## 2018-11-23 DIAGNOSIS — Z5181 Encounter for therapeutic drug level monitoring: Secondary | ICD-10-CM | POA: Diagnosis not present

## 2018-11-23 NOTE — Progress Notes (Signed)
I have read the note, and I agree with the clinical assessment and plan.  Charles K Willis   
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
## 2018-11-23 NOTE — Patient Instructions (Signed)
Your Plan:  Continue Depakote, vimpat and keppra Blood work today If your symptoms worsen or you develop new symptoms please let us know.   Thank you for coming to see [place] at [facility]. I hope we have been able to provide you high quality care today.  You may receive a patient satisfaction survey over the next few weeks. We would appreciate your feedback and comments so that we may continue to improve ourselves and the health of our patients.

## 2018-11-25 ENCOUNTER — Telehealth: Payer: Self-pay | Admitting: *Deleted

## 2018-11-25 LAB — COMPREHENSIVE METABOLIC PANEL
ALK PHOS: 55 IU/L (ref 39–117)
ALT: 10 IU/L (ref 0–44)
AST: 17 IU/L (ref 0–40)
Albumin/Globulin Ratio: 1.2 (ref 1.2–2.2)
Albumin: 4.1 g/dL (ref 3.8–4.9)
BUN/Creatinine Ratio: 12 (ref 9–20)
BUN: 11 mg/dL (ref 6–24)
Bilirubin Total: 0.4 mg/dL (ref 0.0–1.2)
CALCIUM: 10.2 mg/dL (ref 8.7–10.2)
CO2: 22 mmol/L (ref 20–29)
Chloride: 107 mmol/L — ABNORMAL HIGH (ref 96–106)
Creatinine, Ser: 0.89 mg/dL (ref 0.76–1.27)
GFR calc Af Amer: 111 mL/min/{1.73_m2} (ref >59)
GFR, EST NON AFRICAN AMERICAN: 96 mL/min/{1.73_m2} (ref >59)
GLOBULIN, TOTAL: 3.3 g/dL (ref 1.5–4.5)
GLUCOSE: 80 mg/dL (ref 65–99)
Potassium: 4.2 mmol/L (ref 3.5–5.2)
SODIUM: 142 mmol/L (ref 134–144)
Total Protein: 7.4 g/dL (ref 6.0–8.5)

## 2018-11-25 LAB — LEVETIRACETAM LEVEL: LEVETIRACETAM: 79.1 ug/mL — AB (ref 10.0–40.0)

## 2018-11-25 LAB — CBC WITH DIFFERENTIAL/PLATELET
BASOS: 2 %
Basophils Absolute: 0.1 10*3/uL (ref 0.0–0.2)
EOS (ABSOLUTE): 0.1 10*3/uL (ref 0.0–0.4)
EOS: 4 %
HEMATOCRIT: 37.9 % (ref 37.5–51.0)
HEMOGLOBIN: 11.8 g/dL — AB (ref 13.0–17.7)
IMMATURE GRANS (ABS): 0 10*3/uL (ref 0.0–0.1)
Immature Granulocytes: 0 %
LYMPHS: 53 %
Lymphocytes Absolute: 1.7 10*3/uL (ref 0.7–3.1)
MCH: 24 pg — ABNORMAL LOW (ref 26.6–33.0)
MCHC: 31.1 g/dL — ABNORMAL LOW (ref 31.5–35.7)
MCV: 77 fL — ABNORMAL LOW (ref 79–97)
MONOCYTES: 11 %
Monocytes Absolute: 0.4 10*3/uL (ref 0.1–0.9)
Neutrophils Absolute: 1 10*3/uL — ABNORMAL LOW (ref 1.4–7.0)
Neutrophils: 30 %
Platelets: 183 10*3/uL (ref 150–450)
RBC: 4.92 x10E6/uL (ref 4.14–5.80)
RDW: 18.5 % — ABNORMAL HIGH (ref 11.6–15.4)
WBC: 3.2 10*3/uL — AB (ref 3.4–10.8)

## 2018-11-25 LAB — LACOSAMIDE: LACOSAMIDE: 12.8 ug/mL — AB (ref 5.0–10.0)

## 2018-11-25 LAB — VALPROIC ACID LEVEL: VALPROIC ACID LVL: 100 ug/mL (ref 50–100)

## 2018-11-25 NOTE — Telephone Encounter (Signed)
-----   Message from Butch Penny, NP sent at 11/25/2018  9:34 AM EST ----- Lab work consistent with previous lab work. Please forward to PCP. If he has any seizure events please let us know.

## 2018-11-25 NOTE — Telephone Encounter (Signed)
I spoke to sister, Bethann Berkshire (is on DPR), but also caregiver, Mr. Earlene Plater.  Relayed the lab results to them.  Verbalized understanding and will forward to pcp.

## 2019-03-24 ENCOUNTER — Other Ambulatory Visit: Payer: Self-pay

## 2019-03-24 ENCOUNTER — Ambulatory Visit (INDEPENDENT_AMBULATORY_CARE_PROVIDER_SITE_OTHER): Payer: Medicaid Other | Admitting: Neurology

## 2019-03-24 ENCOUNTER — Telehealth: Payer: Self-pay | Admitting: Neurology

## 2019-03-24 ENCOUNTER — Encounter: Payer: Self-pay | Admitting: Neurology

## 2019-03-24 DIAGNOSIS — R569 Unspecified convulsions: Secondary | ICD-10-CM | POA: Diagnosis not present

## 2019-03-24 DIAGNOSIS — F79 Unspecified intellectual disabilities: Secondary | ICD-10-CM

## 2019-03-24 NOTE — Telephone Encounter (Signed)
Please call the patient and get them setup for a revisit office visit with Dr. Terrace Arabia in 4-6 weeks. He has VNS and needs recheck. I need to call the drug rep to try and get a new magnet.
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
## 2019-03-25 NOTE — Telephone Encounter (Signed)
I was able to speak with Seth Brock (on [facility]) who is responsible for taking the patients to his appts.  He is aware that his next follow up will be with Dr. Terrace Arabia on 04/22/2019 at 2pm (arrival time 1:30pm).  Also, he will come by the office today to pick up his new VNS magnet,  It has been placed up front for him.

## 2019-03-26 NOTE — Progress Notes (Signed)
I have reviewed and agreed above plan. 

## 2019-04-05 ENCOUNTER — Emergency Department (HOSPITAL_COMMUNITY)
Admission: EM | Admit: 2019-04-05 | Discharge: 2019-04-05 | Disposition: A | Discharge status: Home / Self Care | Payer: Medicaid Other | Attending: Emergency Medicine | Admitting: Emergency Medicine

## 2019-04-05 ENCOUNTER — Encounter (HOSPITAL_COMMUNITY): Payer: Self-pay | Admitting: Emergency Medicine

## 2019-04-05 DIAGNOSIS — F17290 Nicotine dependence, other tobacco product, uncomplicated: Secondary | ICD-10-CM | POA: Insufficient documentation

## 2019-04-05 DIAGNOSIS — I10 Essential (primary) hypertension: Secondary | ICD-10-CM | POA: Diagnosis not present

## 2019-04-05 DIAGNOSIS — R569 Unspecified convulsions: Secondary | ICD-10-CM | POA: Insufficient documentation

## 2019-04-05 DIAGNOSIS — Z79899 Other long term (current) drug therapy: Secondary | ICD-10-CM | POA: Insufficient documentation

## 2019-04-05 DIAGNOSIS — I2510 Atherosclerotic heart disease of native coronary artery without angina pectoris: Secondary | ICD-10-CM | POA: Diagnosis not present

## 2019-04-05 DIAGNOSIS — I4891 Unspecified atrial fibrillation: Secondary | ICD-10-CM | POA: Diagnosis not present

## 2019-04-05 LAB — CBG MONITORING, ED: Glucose-Capillary: 158 mg/dL — ABNORMAL HIGH (ref 70–99)
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
## 2019-04-05 NOTE — Discharge Instructions (Signed)
Return for repeat event, headache, chest pain, shortness of breath

## 2019-04-05 NOTE — ED Triage Notes (Signed)
Per EMS and Pt: pt was shopping in [facility] when workers report pt had what appeared to be a seizure. Pt had an unwitnessed fall. Upon EMS arrival, pt was standing and talking. Alert to self at that time. Pt reports a HX of seizures.   EMS Vitals:   CBG 182 Pulse 88 BP 140/70

## 2019-04-15 ENCOUNTER — Other Ambulatory Visit: Payer: Self-pay | Admitting: Adult Health

## 2019-04-15 DIAGNOSIS — G40209 Localization-related (focal) (partial) symptomatic epilepsy and epileptic syndromes with complex partial seizures, not intractable, without status epilepticus: Secondary | ICD-10-CM

## 2019-04-15 MED ORDER — VIMPAT 150 MG PO TABS: 2.0000 | ORAL_TABLET | Freq: Two times a day (BID) | ORAL | 1 refills | Status: DC

## 2019-04-15 MED ORDER — DIVALPROEX SODIUM ER 500 MG PO TB24: 1000.0000 mg | ORAL_TABLET | Freq: Two times a day (BID) | ORAL | 3 refills | Status: DC

## 2019-04-15 NOTE — Telephone Encounter (Signed)
Spoke to [employer], caregiver for pt.  He stated that Dr. Roslyn Smiling is pts pcp.  She is a roving pcp.  Pt has appt 04-22-19 to VNS check.

## 2019-04-15 NOTE — Telephone Encounter (Signed)
Seth Brock,Seth Brock(on DPR)  has called for a refill on pt's:Lacosamide (VIMPAT) 150 MG TABS  divalproex (DEPAKOTE ER) 500 MG 24 hr tablet [id number]

## 2019-04-15 NOTE — Addendum Note (Signed)
Addended by: Brandon Melnick on: 04/15/2019 01:07 PM   Modules accepted: Orders

## 2019-04-15 NOTE — Addendum Note (Signed)
Addended by: Brandon Melnick on: 04/15/2019 01:03 PM   Modules accepted: Orders

## 2019-04-15 NOTE — Telephone Encounter (Signed)
Drug registry checked.  Last fill 03-08-19 #120 from Dr. Roslyn Smiling in [place], [place] [facility]).

## 2019-04-22 ENCOUNTER — Telehealth: Payer: Self-pay | Admitting: *Deleted

## 2019-04-22 ENCOUNTER — Encounter: Payer: Self-pay | Admitting: Neurology

## 2019-04-22 ENCOUNTER — Ambulatory Visit: Payer: Medicaid Other | Admitting: Neurology

## 2019-04-22 ENCOUNTER — Other Ambulatory Visit: Payer: Self-pay

## 2019-04-22 VITALS — BP 118/73 | HR 78 | Temp 96.9°F | Ht 74.0 in | Wt 201.0 lb

## 2019-04-22 DIAGNOSIS — R569 Unspecified convulsions: Secondary | ICD-10-CM

## 2019-04-22 DIAGNOSIS — G40209 Localization-related (focal) (partial) symptomatic epilepsy and epileptic syndromes with complex partial seizures, not intractable, without status epilepticus: Secondary | ICD-10-CM

## 2019-04-22 DIAGNOSIS — F79 Unspecified intellectual disabilities: Secondary | ICD-10-CM

## 2019-04-22 DIAGNOSIS — G40119 Localization-related (focal) (partial) symptomatic epilepsy and epileptic syndromes with simple partial seizures, intractable, without status epilepticus: Secondary | ICD-10-CM | POA: Diagnosis not present

## 2019-04-22 MED ORDER — LEVETIRACETAM 1000 MG PO TABS: 2000.0000 mg | ORAL_TABLET | Freq: Two times a day (BID) | ORAL | 4 refills | Status: DC

## 2019-04-22 NOTE — Telephone Encounter (Signed)
Arrived late for appt and was able to be seen.
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
## 2019-04-22 NOTE — Telephone Encounter (Signed)
No showed follow up appointment. 

## 2019-06-01 ENCOUNTER — Ambulatory Visit: Payer: Medicaid Other | Admitting: Adult Health

## 2019-08-25 ENCOUNTER — Encounter: Payer: Self-pay | Admitting: Neurology

## 2019-08-25 ENCOUNTER — Other Ambulatory Visit: Payer: Self-pay

## 2019-08-25 ENCOUNTER — Ambulatory Visit: Payer: Medicaid Other | Admitting: Neurology

## 2019-08-25 VITALS — BP 126/87 | HR 68 | Temp 97.7°F | Ht 74.0 in | Wt 200.0 lb

## 2019-08-25 DIAGNOSIS — R569 Unspecified convulsions: Secondary | ICD-10-CM | POA: Diagnosis not present

## 2019-08-25 DIAGNOSIS — G40209 Localization-related (focal) (partial) symptomatic epilepsy and epileptic syndromes with complex partial seizures, not intractable, without status epilepticus: Secondary | ICD-10-CM | POA: Diagnosis not present

## 2019-08-25 NOTE — Patient Instructions (Addendum)
Continue current medications Keppra, Vimpat, Depakote. Return in 6 months. Talk with Mr. Rosana Hoes, we were unable to reach him today. If he has any concerns please have him call us.
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
## 2019-09-06 NOTE — Progress Notes (Signed)
I have reviewed and agreed above plan. 

## 2019-11-27 ENCOUNTER — Emergency Department (HOSPITAL_COMMUNITY)
Admission: EM | Admit: 2019-11-27 | Discharge: 2019-11-27 | Disposition: A | Discharge status: Home / Self Care | Payer: Medicaid Other | Attending: Emergency Medicine | Admitting: Emergency Medicine

## 2019-11-27 ENCOUNTER — Other Ambulatory Visit: Payer: Self-pay

## 2019-11-27 ENCOUNTER — Encounter (HOSPITAL_COMMUNITY): Payer: Self-pay | Admitting: *Deleted

## 2019-11-27 DIAGNOSIS — R569 Unspecified convulsions: Secondary | ICD-10-CM | POA: Insufficient documentation

## 2019-11-27 DIAGNOSIS — I10 Essential (primary) hypertension: Secondary | ICD-10-CM | POA: Diagnosis not present

## 2019-11-27 DIAGNOSIS — F17290 Nicotine dependence, other tobacco product, uncomplicated: Secondary | ICD-10-CM | POA: Diagnosis not present

## 2019-11-27 DIAGNOSIS — I2510 Atherosclerotic heart disease of native coronary artery without angina pectoris: Secondary | ICD-10-CM | POA: Diagnosis not present

## 2019-11-27 DIAGNOSIS — Z79899 Other long term (current) drug therapy: Secondary | ICD-10-CM | POA: Diagnosis not present

## 2019-11-27 LAB — CBG MONITORING, ED: Glucose-Capillary: 71 mg/dL (ref 70–99)

## 2019-11-27 NOTE — ED Triage Notes (Signed)
Patient presents to ed via [facility] states patient was found on sitting on the side of the road. Confused. Upon arrival to ED patient is alert oriented to name place day  And that he was sitting on the side of the road. States he has a history of seizures , no incont. Or no injury to  His tongue, States he says in a boarding house . Sister Morrison Old called , also spoke with Cheyenne Adas [phone] aware patient is here and states he will pick patient up when he is ready to come home.

## 2019-11-27 NOTE — Discharge Instructions (Addendum)
Please call your neurologist office Monday to talk about your visit to the ER today.  Ask your doctor if they want to make any other changes to your meds.
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
## 2019-11-29 ENCOUNTER — Emergency Department (HOSPITAL_COMMUNITY)
Admission: EM | Admit: 2019-11-29 | Discharge: 2019-11-29 | Disposition: A | Discharge status: Home / Self Care | Payer: Medicaid Other | Attending: Emergency Medicine | Admitting: Emergency Medicine

## 2019-11-29 ENCOUNTER — Encounter (HOSPITAL_COMMUNITY): Payer: Self-pay | Admitting: *Deleted

## 2019-11-29 ENCOUNTER — Emergency Department (HOSPITAL_COMMUNITY): Payer: Medicaid Other

## 2019-11-29 ENCOUNTER — Other Ambulatory Visit: Payer: Self-pay

## 2019-11-29 DIAGNOSIS — F17290 Nicotine dependence, other tobacco product, uncomplicated: Secondary | ICD-10-CM | POA: Diagnosis not present

## 2019-11-29 DIAGNOSIS — I119 Hypertensive heart disease without heart failure: Secondary | ICD-10-CM | POA: Insufficient documentation

## 2019-11-29 DIAGNOSIS — Z23 Encounter for immunization: Secondary | ICD-10-CM | POA: Diagnosis not present

## 2019-11-29 DIAGNOSIS — Y92199 Unspecified place in other specified residential institution as the place of occurrence of the external cause: Secondary | ICD-10-CM | POA: Diagnosis not present

## 2019-11-29 DIAGNOSIS — Z79899 Other long term (current) drug therapy: Secondary | ICD-10-CM | POA: Insufficient documentation

## 2019-11-29 DIAGNOSIS — R569 Unspecified convulsions: Secondary | ICD-10-CM

## 2019-11-29 DIAGNOSIS — S0101XA Laceration without foreign body of scalp, initial encounter: Secondary | ICD-10-CM

## 2019-11-29 DIAGNOSIS — Y9389 Activity, other specified: Secondary | ICD-10-CM | POA: Insufficient documentation

## 2019-11-29 DIAGNOSIS — I2510 Atherosclerotic heart disease of native coronary artery without angina pectoris: Secondary | ICD-10-CM | POA: Diagnosis not present

## 2019-11-29 DIAGNOSIS — W1839XA Other fall on same level, initial encounter: Secondary | ICD-10-CM | POA: Diagnosis not present

## 2019-11-29 DIAGNOSIS — Y999 Unspecified external cause status: Secondary | ICD-10-CM | POA: Insufficient documentation

## 2019-11-29 LAB — CBG MONITORING, ED: Glucose-Capillary: 70 mg/dL (ref 70–99)

## 2019-11-29 LAB — VALPROIC ACID LEVEL: Valproic Acid Lvl: 62 ug/mL (ref 50.0–100.0)

## 2019-11-29 MED ORDER — LIDOCAINE-EPINEPHRINE (PF) 2 %-1:200000 IJ SOLN
10.0000 mL | Freq: Once | INTRAMUSCULAR | Status: AC
  Administered 2019-11-29: 10 mL
  Filled 2019-11-29: qty 20

## 2019-11-29 MED ORDER — TETANUS-DIPHTH-ACELL PERTUSSIS 5-2.5-18.5 LF-MCG/0.5 IM SUSP
0.5000 mL | Freq: Once | INTRAMUSCULAR | Status: AC
  Administered 2019-11-29: 10:00:00 0.5 mL via INTRAMUSCULAR
  Filled 2019-11-29: qty 0.5

## 2019-11-29 MED ORDER — BACITRACIN ZINC 500 UNIT/GM EX OINT: TOPICAL_OINTMENT | Freq: Once | CUTANEOUS | Status: AC

## 2019-11-29 NOTE — ED Notes (Signed)
Transported to CT 

## 2019-11-29 NOTE — ED Notes (Signed)
PT has DC papers. DC instructions reviewed with PT . Pt voices understanding. Pt waiting for family to pick him up.

## 2019-11-29 NOTE — Discharge Instructions (Addendum)
The stitches can come out in 10 to 14 days.  Follow-up with your neurologist about the seizure.
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
## 2019-11-29 NOTE — ED Triage Notes (Addendum)
Patient presents to ed via [facility] states he had a 30 sec seizure and hit his head on a table 1/2 " laceration to top os his head. Patient is alert oriented , however doesn't remember the event. Bleeding controled

## 2019-12-01 ENCOUNTER — Telehealth: Payer: Self-pay | Admitting: *Deleted

## 2019-12-01 NOTE — Telephone Encounter (Signed)
I have spoken to the patient and he has been scheduled on 12/09/2019 for a VNS check.

## 2019-12-01 NOTE — Telephone Encounter (Signed)
He reportedly had a seizure 11/27/2019 outside of a grocery store, upon arrival to the ED is back to normal.  He has reported it is fairly typical for him to have a seizure every month.  He had reported compliance with Depakote, Vimpat, and Keppra.  He was also seen 11/29/2019 for questionable seizure.  He had 1/32 seizure, fell and hit his head, with laceration.  He said he did not have a seizure, that he just fell.  According to the ER note, the seizure was witnessed by staff.  His Depakote level was 62.  CT head was reassuring. Will consult to Dr. Terrace Arabia to see if VNS needs to be evaluated (was interrogated 04/22/2019, no changes were made.)  CT head 11/29/2019 IMPRESSION: Soft tissue laceration along the right posterior vertex. No evidence of calvarial fracture.  Otherwise normal head CT.

## 2019-12-01 NOTE — Telephone Encounter (Signed)
Last vns interrogation was in July 2020, already reached high end of output current 2.25 mA, 35% duty cycle, there is slight room to increase his duty cycle to 44%,with his lifelong history of epilepsy, he is going to have somewhat recurrent seizure, if he has an tolerable seizure frequency, may give him an early appointment for her VNS modification,  PJK:DTOIZ [id number]  Generator Serial No.[id number]  Normal OutPut Current:2.26mA- Signal Frequency:30 Hz Pulse Width: 500 sec Signal on time: 30 seconds Signal off time:1.13minutes -was 1.8 minutes  Magnetic Output Current: 2.49mA Pulse Width:500 sec Signal On Time: 60Seconds Diagnostic Lead Impedance: Number of magnetic activations

## 2019-12-01 NOTE — Telephone Encounter (Signed)
I called home # could not LM.  I called and spoke to Cheyenne Adas, boards pt in home they own.  Pt lives with 3 others,  Hi mother passed.  He had seizure 11-27-19 [employer], then again on 11-28-19 when out walking.  He as not sure about VNS.  Thought he should have appt.  Since last sz 11-28-19 has not had one that he is aware.

## 2019-12-09 ENCOUNTER — Ambulatory Visit: Payer: Self-pay | Admitting: Neurology

## 2019-12-15 ENCOUNTER — Telehealth: Payer: Self-pay | Admitting: *Deleted

## 2019-12-15 ENCOUNTER — Ambulatory Visit: Payer: Self-pay | Admitting: Neurology

## 2019-12-15 ENCOUNTER — Encounter: Payer: Self-pay | Admitting: Neurology

## 2019-12-15 NOTE — Telephone Encounter (Signed)
No showed office visit for VNS check.

## 2020-02-24 ENCOUNTER — Telehealth: Payer: Self-pay | Admitting: Neurology

## 2020-02-24 ENCOUNTER — Other Ambulatory Visit: Payer: Self-pay

## 2020-02-24 ENCOUNTER — Ambulatory Visit: Payer: Medicaid Other | Admitting: Neurology

## 2020-02-24 ENCOUNTER — Encounter: Payer: Self-pay | Admitting: Neurology

## 2020-02-24 VITALS — BP 138/86 | HR 58 | Temp 97.6°F | Ht 74.0 in | Wt 221.5 lb

## 2020-02-24 DIAGNOSIS — R569 Unspecified convulsions: Secondary | ICD-10-CM | POA: Diagnosis not present

## 2020-02-24 DIAGNOSIS — Z9689 Presence of other specified functional implants: Secondary | ICD-10-CM

## 2020-02-24 DIAGNOSIS — G40119 Localization-related (focal) (partial) symptomatic epilepsy and epileptic syndromes with simple partial seizures, intractable, without status epilepticus: Secondary | ICD-10-CM

## 2020-02-24 DIAGNOSIS — G40209 Localization-related (focal) (partial) symptomatic epilepsy and epileptic syndromes with complex partial seizures, not intractable, without status epilepticus: Secondary | ICD-10-CM

## 2020-02-24 MED ORDER — VIMPAT 150 MG PO TABS: 2.0000 | ORAL_TABLET | Freq: Two times a day (BID) | ORAL | 4 refills | Status: DC

## 2020-02-24 MED ORDER — DIVALPROEX SODIUM ER 500 MG PO TB24: 1000.0000 mg | ORAL_TABLET | Freq: Two times a day (BID) | ORAL | 4 refills | Status: DC

## 2020-02-24 MED ORDER — LEVETIRACETAM 1000 MG PO TABS: 2000.0000 mg | ORAL_TABLET | Freq: Two times a day (BID) | ORAL | 4 refills | Status: DC

## 2020-02-24 NOTE — Telephone Encounter (Signed)
He has a pending follow up with Maralyn Sago on 09/08/20.

## 2020-02-24 NOTE — Telephone Encounter (Signed)
Please make sure that he has a follow up visit with Maralyn Sago in 6 months
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
## 2020-05-18 ENCOUNTER — Other Ambulatory Visit (HOSPITAL_COMMUNITY): Payer: Self-pay | Admitting: General Practice

## 2020-05-18 ENCOUNTER — Other Ambulatory Visit: Payer: Self-pay | Admitting: General Practice

## 2020-05-18 DIAGNOSIS — G4089 Other seizures: Secondary | ICD-10-CM

## 2020-06-05 ENCOUNTER — Ambulatory Visit (HOSPITAL_COMMUNITY): Payer: Medicaid Other

## 2020-06-09 ENCOUNTER — Ambulatory Visit: Payer: Medicaid Other | Admitting: Cardiology

## 2020-06-09 ENCOUNTER — Other Ambulatory Visit: Payer: Self-pay

## 2020-06-09 ENCOUNTER — Encounter: Payer: Self-pay | Admitting: Cardiology

## 2020-06-09 VITALS — BP 139/85 | HR 88 | Resp 17 | Ht 73.0 in | Wt 207.0 lb

## 2020-06-09 DIAGNOSIS — I10 Essential (primary) hypertension: Secondary | ICD-10-CM

## 2020-06-09 DIAGNOSIS — I480 Paroxysmal atrial fibrillation: Secondary | ICD-10-CM

## 2020-06-09 MED ORDER — METOPROLOL SUCCINATE ER 50 MG PO TB24: 50.0000 mg | ORAL_TABLET | Freq: Every day | ORAL | 3 refills | Status: DC

## 2020-06-09 NOTE — Progress Notes (Signed)
  Patient referred by Gomez, Roger David, PA-C for hypertension  Subjective:   Seth Brock, male    DOB: 01/12/1961, 58 y.o.   MRN: [id number]   Chief Complaint  Patient presents with  . Hypertension  . New Patient (Initial Visit)     HPI  58 y.o. African American male with hypertension, h/o Afib in 2016, h/o seizures.  Patient was referred for management of hypertension. He currently lives in a group home. He tells me that he has no family here, and he makes his own medical decisions. He states that he has had seizures all his life. His last seizure was on 05/09/2020. He is currently seeing [name] for management of the same. He had had an episode of Afib in 2016. Given low CHADVASc score, anticoagulation was not recommended. Subsequent EKG's had showed normal sinus rhythm.  Today, patient denies chest pain, shortness of breath, palpitations, leg edema, orthopnea, PND, TIA/syncope. He does not drink alcohol.    Past Medical History:  Diagnosis Date  . Constipation    takes Miralax daily  . Coronary artery disease   . Epilepsy seizure, nonconvulsive, generalized (HCC)   . FHx: atrial fibrillation   . Hypertension   . Irregular heart beat   . Seizures (HCC)     Past Surgical History:  Procedure Laterality Date  . NO PAST SURGERIES    . None Listed    . VAGUS NERVE STIMULATOR INSERTION Left 03/05/2016   Procedure: Left Vagal Nerve Stimulator Placement;  Surgeon: Neelesh Nundkumar, MD;  Location: [facility];  Service: Neurosurgery;  Laterality: Left;  Left Vagal Nerve Stimulator placement     Social History   Tobacco Use  Smoking Status Current Every Day Smoker  . Types: Cigars  Smokeless Tobacco Never Used  Tobacco Comment   as a teenager, 11/23/18 smoking 5-6 cigars daily    Social History   Substance and Sexual Activity  Alcohol Use No   Comment: patient quit in 1993      History reviewed. No pertinent family history.   Current Outpatient  Medications on File Prior to Visit  Medication Sig Dispense Refill  . amLODipine (NORVASC) 5 MG tablet Take 1 tablet (5 mg total) by mouth daily. 90 tablet 3  . atorvastatin (LIPITOR) 20 MG tablet Take 1 tablet (20 mg total) by mouth daily. 90 tablet 3  . benzocaine (ORAJEL) 10 % mucosal gel Use as directed 1 application in the mouth or throat as needed for mouth pain. 5.3 g 0  . diltiazem (DILTIAZEM CD) 240 MG 24 hr capsule Take 1 capsule (240 mg total) by mouth daily. 90 capsule 3  . divalproex (DEPAKOTE ER) 500 MG 24 hr tablet Take 2 tablets (1,000 mg total) by mouth 2 (two) times daily. 360 tablet 4  . Lacosamide (VIMPAT) 150 MG TABS Take 2 tablets (300 mg total) by mouth 2 (two) times daily. 360 tablet 4  . levETIRAcetam (KEPPRA) 1000 MG tablet Take 2 tablets (2,000 mg total) by mouth 2 (two) times daily. 360 tablet 4  . LORazepam (ATIVAN) 1 MG tablet Take 1 tablet (1 mg total) by mouth 2 (two) times daily as needed for anxiety or seizure. 4 tablet 0  . naproxen (NAPROSYN) 500 MG tablet Take 1 tablet (500 mg total) by mouth 2 (two) times daily with a meal. 60 tablet 0  . omega-3 acid ethyl esters (LOVAZA) 1 g capsule Take 1 capsule (1 g total) by mouth 2 (two) times daily. 60   capsule 3  . ranitidine (ZANTAC) 150 MG tablet Take 1 tablet (150 mg total) by mouth 2 (two) times daily. 60 tablet 0  . senna-docusate (SENOKOT-S) 8.6-50 MG tablet Take 1 tablet by mouth daily.     No current facility-administered medications on file prior to visit.    Cardiovascular and other pertinent studies:  EKG 06/09/2020: Atrial fibrillation w/RVR 116 bpm  Echocardiogram 2016: Mod LVH. EF 65-70%. Grade 1 DD Mild MR   Recent labs: 02/02/2020: Glucose 69, BUN/Cr 10/0.69. EGFR normal. Na/K 139/3.2. Rest of the CMP normal HbA1C 5.5% Chol 144, TG 129, HDL 57, LDL 61 TSH 3.6normal    Review of Systems  Cardiovascular: Negative for chest pain, dyspnea on exertion, leg swelling, palpitations and syncope.          Vitals:   06/09/20 1336 06/09/20 1341  BP: (!) 144/89 139/85  Pulse: (!) 105 88  Resp: 17   SpO2: 98%      Body mass index is 27.31 kg/m. Filed Weights   06/09/20 1336  Weight: 207 lb (93.9 kg)     Objective:   Physical Exam Vitals and nursing note reviewed.  Constitutional:      General: He is not in acute distress. Neck:     Vascular: No JVD.  Cardiovascular:     Rate and Rhythm: Tachycardia present. Rhythm irregular.     Heart sounds: Normal heart sounds. No murmur heard.   Pulmonary:     Effort: Pulmonary effort is normal.     Breath sounds: Normal breath sounds. No wheezing or rales.  Neurological:     Comments: Intellectual disability            Assessment & Recommendations:   58 y.o. African American male with hypertension, h/o Afib in 2016, h/o seizures,  Afib: In Afib with RVR around 110 bpm today, without any symptoms. Unclear as to the onset of Afib. Last known episode was in 2016.  CHA2DS2VASc score 1, annual stroke risk 0.6%. Recommend adding metoprolol succinate 50 mg daily for better HR and BP control.  I do not think patient fully understands the diagnosis. I would prefer conservative management at this stage and readdress rate vs rhythm control at f/u visit in 4 weeks. If no rhythm control to be pursued, does not need anticoagulation.  Hypertension: Sub-optimal control. Added metoprolol succinate 50 mg. He is on amlodipine and diltiazem. Ideally, would like to switch amlodipine to lisinopril at next visit.    Thank you for referring the patient to us. Please feel free to contact with any questions.   Manish J Patwardhan, MD Pager: [phone] Office: [phone] 

## 2020-06-11 ENCOUNTER — Encounter: Payer: Self-pay | Admitting: Cardiology

## 2020-06-14 ENCOUNTER — Ambulatory Visit (HOSPITAL_COMMUNITY): Payer: Medicaid Other

## 2020-06-14 ENCOUNTER — Encounter (HOSPITAL_COMMUNITY): Payer: Self-pay

## 2020-06-23 ENCOUNTER — Other Ambulatory Visit: Payer: Medicaid Other

## 2020-06-28 ENCOUNTER — Other Ambulatory Visit: Payer: Self-pay

## 2020-06-28 ENCOUNTER — Ambulatory Visit: Payer: Medicaid Other

## 2020-06-28 DIAGNOSIS — I480 Paroxysmal atrial fibrillation: Secondary | ICD-10-CM

## 2020-07-12 NOTE — Progress Notes (Signed)
Patient referred by [name], PA-C for hypertension  Subjective:   Seth Brock, male    DOB: 1961/10/21, 59 y.o.   MRN: [id number]   Chief Complaint  Patient presents with  . Hypertension    4 week f/u  . Atrial Fibrillation     HPI  59 y.o. African American male with hypertension, h/o Afib in 2016, h/o seizures  Patient is here for follow up. He denies chest pain, shortness of breath, palpitations, leg edema, orthopnea, PND, TIA/syncope. When asked about walking, he tells me "walks all around [place]".    Initial consultation HPI 05/2019: Patient was referred for management of hypertension. He currently lives in a group home. He tells me that he has no family here, and he makes his own medical decisions. He states that he has had seizures all his life. His last seizure was on 05/09/2020. He is currently seeing [facility] for management of the same. He had had an episode of Afib in 2016. Given low CHADVASc score, anticoagulation was not recommended. Subsequent EKG's had showed normal sinus rhythm.  Today, patient denies chest pain, shortness of breath, palpitations, leg edema, orthopnea, PND, TIA/syncope. He does not drink alcohol.     Current Outpatient Medications on File Prior to Visit  Medication Sig Dispense Refill  . amLODipine (NORVASC) 5 MG tablet Take 1 tablet (5 mg total) by mouth daily. 90 tablet 3  . atorvastatin (LIPITOR) 20 MG tablet Take 1 tablet (20 mg total) by mouth daily. 90 tablet 3  . benzocaine (ORAJEL) 10 % mucosal gel Use as directed 1 application in the mouth or throat as needed for mouth pain. 5.3 g 0  . diltiazem (DILTIAZEM CD) 240 MG 24 hr capsule Take 1 capsule (240 mg total) by mouth daily. 90 capsule 3  . divalproex (DEPAKOTE ER) 500 MG 24 hr tablet Take 2 tablets (1,000 mg total) by mouth 2 (two) times daily. 360 tablet 4  . Lacosamide (VIMPAT) 150 MG TABS Take 2 tablets (300 mg total) by mouth 2 (two) times daily. 360  tablet 4  . levETIRAcetam (KEPPRA) 1000 MG tablet Take 2 tablets (2,000 mg total) by mouth 2 (two) times daily. 360 tablet 4  . LORazepam (ATIVAN) 1 MG tablet Take 1 tablet (1 mg total) by mouth 2 (two) times daily as needed for anxiety or seizure. 4 tablet 0  . metoprolol succinate (TOPROL-XL) 50 MG 24 hr tablet Take 1 tablet (50 mg total) by mouth daily. Take with or immediately following a meal. 30 tablet 3  . naproxen (NAPROSYN) 500 MG tablet Take 1 tablet (500 mg total) by mouth 2 (two) times daily with a meal. 60 tablet 0  . omega-3 acid ethyl esters (LOVAZA) 1 g capsule Take 1 capsule (1 g total) by mouth 2 (two) times daily. 60 capsule 3  . ranitidine (ZANTAC) 150 MG tablet Take 1 tablet (150 mg total) by mouth 2 (two) times daily. 60 tablet 0  . senna-docusate (SENOKOT-S) 8.6-50 MG tablet Take 1 tablet by mouth daily.     No current facility-administered medications on file prior to visit.    Cardiovascular and other pertinent studies:  EKG 07/13/2020: Sinus rhythm 66 bpm  Bi-atrial enlargement Compared to previous EKG on 06/09/2020, Afib now resolved  EKG 06/09/2020: Atrial fibrillation w/RVR 116 bpm  Echocardiogram 2016: Mod LVH. EF 65-70%. Grade 1 DD Mild MR   Recent labs: 02/02/2020: Glucose 69, BUN/Cr 10/0.69. EGFR normal. Na/K 139/3.2. Rest of the  CMP normal HbA1C 5.5% Chol 144, TG 129, HDL 57, LDL 61 TSH 3.6 normal    Review of Systems  Cardiovascular: Negative for chest pain, dyspnea on exertion, leg swelling, palpitations and syncope.         Vitals:   07/13/20 1419  BP: 138/82  Pulse: 96  Resp: 16  SpO2: 99%     Body mass index is 27.57 kg/m. Filed Weights   07/13/20 1419  Weight: 209 lb (94.8 kg)     Objective:   Physical Exam Vitals and nursing note reviewed.  Constitutional:      General: He is not in acute distress. Neck:     Vascular: No JVD.  Cardiovascular:     Rate and Rhythm: Tachycardia present. Rhythm irregular.     Heart  sounds: Normal heart sounds. No murmur heard.   Pulmonary:     Effort: Pulmonary effort is normal.     Breath sounds: Normal breath sounds. No wheezing or rales.  Neurological:     Comments: Intellectual disability            Assessment & Recommendations:   59 y.o. African American male with hypertension, h/o Afib in 2016, h/o seizures  Paroxysmal Afib: In sinus rhythm today. Low suspicion for angina. Denies snoring. I will repeat TSH. CHA2DS2VASc score 1, annual stroke risk 0.6%. Increase metoprolol succinate to 100 mg daily, continue diltiazem 240 mg. Stop amlodipine. In future, could add lisinopril for blood pressure control. If BP remains elevated.     Nigel Mormon, MD Pager: [phone] Office: [phone]

## 2020-07-13 ENCOUNTER — Other Ambulatory Visit: Payer: Self-pay

## 2020-07-13 ENCOUNTER — Ambulatory Visit: Payer: Medicaid Other | Admitting: Cardiology

## 2020-07-13 ENCOUNTER — Encounter: Payer: Self-pay | Admitting: Cardiology

## 2020-07-13 VITALS — BP 138/82 | HR 96 | Resp 16 | Ht 73.0 in | Wt 209.0 lb

## 2020-07-13 DIAGNOSIS — I480 Paroxysmal atrial fibrillation: Secondary | ICD-10-CM

## 2020-07-13 DIAGNOSIS — I10 Essential (primary) hypertension: Secondary | ICD-10-CM

## 2020-07-13 MED ORDER — METOPROLOL SUCCINATE ER 100 MG PO TB24: 100.0000 mg | ORAL_TABLET | Freq: Every day | ORAL | 1 refills | Status: DC

## 2020-07-14 LAB — TSH: TSH: 2.59 u[IU]/mL (ref 0.450–4.500)

## 2020-08-14 ENCOUNTER — Other Ambulatory Visit: Payer: Self-pay

## 2020-08-14 ENCOUNTER — Emergency Department (HOSPITAL_COMMUNITY)
Admission: EM | Admit: 2020-08-14 | Discharge: 2020-08-14 | Disposition: A | Discharge status: Home / Self Care | Payer: Medicaid Other | Attending: Emergency Medicine | Admitting: Emergency Medicine

## 2020-08-14 DIAGNOSIS — F17290 Nicotine dependence, other tobacco product, uncomplicated: Secondary | ICD-10-CM | POA: Diagnosis not present

## 2020-08-14 DIAGNOSIS — T310 Burns involving less than 10% of body surface: Secondary | ICD-10-CM | POA: Diagnosis present

## 2020-08-14 DIAGNOSIS — I119 Hypertensive heart disease without heart failure: Secondary | ICD-10-CM | POA: Insufficient documentation

## 2020-08-14 MED ORDER — BACITRACIN ZINC 500 UNIT/GM EX OINT: TOPICAL_OINTMENT | Freq: Two times a day (BID) | CUTANEOUS | Status: DC

## 2020-08-14 NOTE — Discharge Instructions (Addendum)
Clean wounds and apply antibiotic twice a day.  Recheck at Urgent care in 2 days
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
## 2020-08-17 ENCOUNTER — Emergency Department (HOSPITAL_COMMUNITY)
Admission: EM | Admit: 2020-08-17 | Discharge: 2020-08-17 | Disposition: A | Discharge status: Other Acute Inpatient Hospital | Payer: Medicaid Other | Attending: Emergency Medicine | Admitting: Emergency Medicine

## 2020-08-17 ENCOUNTER — Encounter (HOSPITAL_COMMUNITY): Payer: Self-pay | Admitting: Emergency Medicine

## 2020-08-17 ENCOUNTER — Other Ambulatory Visit: Payer: Self-pay

## 2020-08-17 DIAGNOSIS — T23001D Burn of unspecified degree of right hand, unspecified site, subsequent encounter: Secondary | ICD-10-CM | POA: Diagnosis present

## 2020-08-17 DIAGNOSIS — Z23 Encounter for immunization: Secondary | ICD-10-CM | POA: Diagnosis not present

## 2020-08-17 DIAGNOSIS — I10 Essential (primary) hypertension: Secondary | ICD-10-CM | POA: Diagnosis not present

## 2020-08-17 DIAGNOSIS — Z79899 Other long term (current) drug therapy: Secondary | ICD-10-CM | POA: Insufficient documentation

## 2020-08-17 DIAGNOSIS — X19XXXA Contact with other heat and hot substances, initial encounter: Secondary | ICD-10-CM | POA: Diagnosis not present

## 2020-08-17 DIAGNOSIS — T310 Burns involving less than 10% of body surface: Secondary | ICD-10-CM | POA: Diagnosis not present

## 2020-08-17 DIAGNOSIS — F17290 Nicotine dependence, other tobacco product, uncomplicated: Secondary | ICD-10-CM | POA: Diagnosis not present

## 2020-08-17 DIAGNOSIS — I2510 Atherosclerotic heart disease of native coronary artery without angina pectoris: Secondary | ICD-10-CM | POA: Diagnosis not present

## 2020-08-17 DIAGNOSIS — L089 Local infection of the skin and subcutaneous tissue, unspecified: Secondary | ICD-10-CM

## 2020-08-17 DIAGNOSIS — T300 Burn of unspecified body region, unspecified degree: Secondary | ICD-10-CM

## 2020-08-17 DIAGNOSIS — Z20822 Contact with and (suspected) exposure to covid-19: Secondary | ICD-10-CM | POA: Insufficient documentation

## 2020-08-17 LAB — RESPIRATORY PANEL BY RT PCR (FLU A&B, COVID)
Influenza A by PCR: NEGATIVE
Influenza B by PCR: NEGATIVE
SARS Coronavirus 2 by RT PCR: NEGATIVE

## 2020-08-17 MED ORDER — VANCOMYCIN HCL IN DEXTROSE 1-5 GM/200ML-% IV SOLN
1000.0000 mg | Freq: Once | INTRAVENOUS | Status: DC
  Filled 2020-08-17: qty 200

## 2020-08-17 MED ORDER — TETANUS-DIPHTH-ACELL PERTUSSIS 5-2.5-18.5 LF-MCG/0.5 IM SUSY
0.5000 mL | PREFILLED_SYRINGE | Freq: Once | INTRAMUSCULAR | Status: AC
  Administered 2020-08-17: 0.5 mL via INTRAMUSCULAR
  Filled 2020-08-17: qty 0.5

## 2020-08-17 MED ORDER — SODIUM CHLORIDE 0.9 % IV BOLUS
1000.0000 mL | Freq: Once | INTRAVENOUS | Status: AC
  Administered 2020-08-17: 1000 mL via INTRAVENOUS

## 2020-08-17 MED ORDER — MORPHINE SULFATE (PF) 4 MG/ML IV SOLN
4.0000 mg | Freq: Once | INTRAVENOUS | Status: AC
  Administered 2020-08-17: 4 mg via INTRAVENOUS
  Filled 2020-08-17: qty 1
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
## 2020-08-18 DIAGNOSIS — T300 Burn of unspecified body region, unspecified degree: Secondary | ICD-10-CM | POA: Insufficient documentation

## 2020-08-23 ENCOUNTER — Emergency Department (HOSPITAL_COMMUNITY)
Admission: EM | Admit: 2020-08-23 | Discharge: 2020-08-23 | Disposition: A | Discharge status: Home / Self Care | Payer: Medicaid Other | Attending: Emergency Medicine | Admitting: Emergency Medicine

## 2020-08-23 DIAGNOSIS — F17290 Nicotine dependence, other tobacco product, uncomplicated: Secondary | ICD-10-CM | POA: Diagnosis not present

## 2020-08-23 DIAGNOSIS — I119 Hypertensive heart disease without heart failure: Secondary | ICD-10-CM | POA: Insufficient documentation

## 2020-08-23 DIAGNOSIS — T23202D Burn of second degree of left hand, unspecified site, subsequent encounter: Secondary | ICD-10-CM | POA: Diagnosis not present

## 2020-08-23 DIAGNOSIS — Z5189 Encounter for other specified aftercare: Secondary | ICD-10-CM

## 2020-08-23 DIAGNOSIS — T23201D Burn of second degree of right hand, unspecified site, subsequent encounter: Secondary | ICD-10-CM | POA: Diagnosis present

## 2020-08-23 DIAGNOSIS — X102XXD Contact with fats and cooking oils, subsequent encounter: Secondary | ICD-10-CM | POA: Insufficient documentation

## 2020-08-23 DIAGNOSIS — I2510 Atherosclerotic heart disease of native coronary artery without angina pectoris: Secondary | ICD-10-CM | POA: Insufficient documentation

## 2020-08-23 DIAGNOSIS — Z79899 Other long term (current) drug therapy: Secondary | ICD-10-CM | POA: Insufficient documentation

## 2020-08-23 MED ORDER — SILVER SULFADIAZINE 1 % EX CREA
TOPICAL_CREAM | Freq: Once | CUTANEOUS | Status: AC
  Filled 2020-08-23: qty 85

## 2020-08-23 MED ORDER — SILVER SULFADIAZINE 1 % EX CREA: TOPICAL_CREAM | Freq: Once | CUTANEOUS | Status: AC
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
## 2020-08-29 ENCOUNTER — Encounter: Payer: Self-pay | Admitting: Neurology

## 2020-08-29 ENCOUNTER — Telehealth: Payer: Self-pay | Admitting: Neurology

## 2020-08-29 ENCOUNTER — Ambulatory Visit: Payer: Medicaid Other | Admitting: Neurology

## 2020-08-29 VITALS — BP 126/82 | Ht 73.0 in | Wt 196.2 lb

## 2020-08-29 DIAGNOSIS — F79 Unspecified intellectual disabilities: Secondary | ICD-10-CM | POA: Diagnosis not present

## 2020-08-29 DIAGNOSIS — G40209 Localization-related (focal) (partial) symptomatic epilepsy and epileptic syndromes with complex partial seizures, not intractable, without status epilepticus: Secondary | ICD-10-CM

## 2020-08-29 DIAGNOSIS — R569 Unspecified convulsions: Secondary | ICD-10-CM

## 2020-08-29 DIAGNOSIS — G40119 Localization-related (focal) (partial) symptomatic epilepsy and epileptic syndromes with simple partial seizures, intractable, without status epilepticus: Secondary | ICD-10-CM | POA: Diagnosis not present
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
## 2020-08-29 NOTE — Patient Instructions (Signed)
Will get you set up to see Dr. Conchita Paris for battery change  Continue taking your seizure medications as prescribed  I will contact the wound center for follow-up   See you back in 3 months

## 2020-08-30 NOTE — Telephone Encounter (Signed)
Did we refer patient to the Wound Clinic ? I don't see a referral for him ? In order for me to handle it will have to be a referral because I am not clinical . The Nurse can call if there is no referral placed . Please advise .
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
## 2020-09-04 DIAGNOSIS — Z4542 Encounter for adjustment and management of neuropacemaker (brain) (peripheral nerve) (spinal cord): Secondary | ICD-10-CM | POA: Insufficient documentation

## 2020-09-05 DIAGNOSIS — T2125XA Burn of second degree of buttock, initial encounter: Secondary | ICD-10-CM | POA: Insufficient documentation

## 2020-09-05 DIAGNOSIS — X102XXD Contact with fats and cooking oils, subsequent encounter: Secondary | ICD-10-CM | POA: Insufficient documentation

## 2020-09-05 DIAGNOSIS — T23332A Burn of third degree of multiple left fingers (nail), not including thumb, initial encounter: Secondary | ICD-10-CM | POA: Insufficient documentation

## 2020-09-05 DIAGNOSIS — T23201A Burn of second degree of right hand, unspecified site, initial encounter: Secondary | ICD-10-CM | POA: Insufficient documentation

## 2020-09-05 DIAGNOSIS — G8911 Acute pain due to trauma: Secondary | ICD-10-CM | POA: Insufficient documentation

## 2020-09-05 DIAGNOSIS — M792 Neuralgia and neuritis, unspecified: Secondary | ICD-10-CM | POA: Insufficient documentation

## 2020-09-07 ENCOUNTER — Other Ambulatory Visit: Payer: Self-pay | Admitting: Neurosurgery

## 2020-09-08 ENCOUNTER — Other Ambulatory Visit: Payer: Self-pay | Admitting: Neurosurgery

## 2020-09-11 NOTE — Progress Notes (Signed)
I have reviewed and agreed above plan. 

## 2020-09-13 DIAGNOSIS — T310 Burns involving less than 10% of body surface: Secondary | ICD-10-CM | POA: Insufficient documentation

## 2020-09-22 ENCOUNTER — Other Ambulatory Visit: Payer: Self-pay | Admitting: Cardiology

## 2020-09-22 DIAGNOSIS — I480 Paroxysmal atrial fibrillation: Secondary | ICD-10-CM

## 2020-09-22 DIAGNOSIS — I10 Essential (primary) hypertension: Secondary | ICD-10-CM

## 2020-09-25 ENCOUNTER — Other Ambulatory Visit (HOSPITAL_COMMUNITY)
Admission: RE | Admit: 2020-09-25 | Discharge: 2020-09-25 | Disposition: A | Discharge status: Home / Self Care | Payer: Medicaid Other | Source: Ambulatory Visit | Attending: Neurosurgery | Admitting: Neurosurgery

## 2020-09-25 ENCOUNTER — Encounter (HOSPITAL_COMMUNITY): Payer: Self-pay | Admitting: Neurosurgery

## 2020-09-25 DIAGNOSIS — Z01812 Encounter for preprocedural laboratory examination: Secondary | ICD-10-CM | POA: Insufficient documentation

## 2020-09-25 DIAGNOSIS — Z20822 Contact with and (suspected) exposure to covid-19: Secondary | ICD-10-CM | POA: Insufficient documentation

## 2020-09-25 LAB — SARS CORONAVIRUS 2 (TAT 6-24 HRS): SARS Coronavirus 2: NEGATIVE
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
## 2020-09-25 NOTE — Anesthesia Preprocedure Evaluation (Addendum)
Anesthesia Evaluation  Patient identified by MRN, date of birth, ID band Patient awake    Reviewed: Allergy & Precautions, NPO status , Patient's Chart, lab work & pertinent test results, reviewed documented beta blocker date and time   Airway Mallampati: II  TM Distance: >3 FB Neck ROM: Full    Dental  (+) Edentulous Upper, Poor Dentition, Dental Advisory Given   Pulmonary neg pulmonary ROS, Current SmokerPatient did not abstain from smoking.,    Pulmonary exam normal breath sounds clear to auscultation       Cardiovascular hypertension, Pt. on home beta blockers and Pt. on medications + CAD  Normal cardiovascular exam+ dysrhythmias Atrial Fibrillation  Rhythm:Regular Rate:Normal  HLD  TTE 2016 normal EF, mild TR   Neuro/Psych Seizures -, Well Controlled,  negative psych ROS   GI/Hepatic negative GI ROS, Neg liver ROS,   Endo/Other  negative endocrine ROS  Renal/GU negative Renal ROS  negative genitourinary   Musculoskeletal negative musculoskeletal ROS (+)   Abdominal   Peds  Hematology negative hematology ROS (+)   Anesthesia Other Findings left-sided vagal nerve stimulator for medically intractable   Reproductive/Obstetrics                            Anesthesia Physical Anesthesia Plan  ASA: III  Anesthesia Plan: General   Post-op Pain Management:    Induction: Intravenous  PONV Risk Score and Plan: 1 and Midazolam, Dexamethasone and Ondansetron  Airway Management Planned: Oral ETT  Additional Equipment:   Intra-op Plan:   Post-operative Plan: Extubation in OR  Informed Consent: I have reviewed the patients History and Physical, chart, labs and discussed the procedure including the risks, benefits and alternatives for the proposed anesthesia with the patient or authorized representative who has indicated his/her understanding and acceptance.     Dental advisory  given  Plan Discussed with: CRNA  Anesthesia Plan Comments:         Anesthesia Quick Evaluation
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
## 2020-09-26 ENCOUNTER — Other Ambulatory Visit: Payer: Self-pay

## 2020-09-26 ENCOUNTER — Encounter (HOSPITAL_COMMUNITY)
Admission: RE | Disposition: A | Discharge status: Home / Self Care | Payer: Self-pay | Source: Home / Self Care | Attending: Neurosurgery

## 2020-09-26 ENCOUNTER — Ambulatory Visit (HOSPITAL_COMMUNITY): Payer: Medicaid Other | Admitting: Anesthesiology

## 2020-09-26 ENCOUNTER — Encounter (HOSPITAL_COMMUNITY): Payer: Self-pay | Admitting: Neurosurgery

## 2020-09-26 ENCOUNTER — Ambulatory Visit (HOSPITAL_COMMUNITY)
Admission: RE | Admit: 2020-09-26 | Discharge: 2020-09-26 | Disposition: A | Discharge status: Home / Self Care | Payer: Medicaid Other | Attending: Neurosurgery | Admitting: Neurosurgery

## 2020-09-26 ENCOUNTER — Telehealth: Payer: Self-pay | Admitting: Neurology

## 2020-09-26 DIAGNOSIS — Z79899 Other long term (current) drug therapy: Secondary | ICD-10-CM | POA: Insufficient documentation

## 2020-09-26 DIAGNOSIS — Z4542 Encounter for adjustment and management of neuropacemaker (brain) (peripheral nerve) (spinal cord): Secondary | ICD-10-CM | POA: Diagnosis not present

## 2020-09-26 DIAGNOSIS — I2510 Atherosclerotic heart disease of native coronary artery without angina pectoris: Secondary | ICD-10-CM | POA: Diagnosis not present

## 2020-09-26 DIAGNOSIS — F17290 Nicotine dependence, other tobacco product, uncomplicated: Secondary | ICD-10-CM | POA: Insufficient documentation

## 2020-09-26 DIAGNOSIS — G40919 Epilepsy, unspecified, intractable, without status epilepticus: Secondary | ICD-10-CM | POA: Insufficient documentation

## 2020-09-26 DIAGNOSIS — I10 Essential (primary) hypertension: Secondary | ICD-10-CM | POA: Diagnosis not present

## 2020-09-26 DIAGNOSIS — I480 Paroxysmal atrial fibrillation: Secondary | ICD-10-CM | POA: Insufficient documentation

## 2020-09-26 HISTORY — PX: VAGUS NERVE STIMULATOR INSERTION: SHX348

## 2020-09-26 HISTORY — DX: Other symptoms and signs involving the nervous system: R29.818

## 2020-09-26 LAB — CBC
HCT: 32.6 % — ABNORMAL LOW (ref 39.0–52.0)
Hemoglobin: 10.3 g/dL — ABNORMAL LOW (ref 13.0–17.0)
MCH: 24.2 pg — ABNORMAL LOW (ref 26.0–34.0)
MCHC: 31.6 g/dL (ref 30.0–36.0)
MCV: 76.5 fL — ABNORMAL LOW (ref 80.0–100.0)
Platelets: 166 10*3/uL (ref 150–400)
RBC: 4.26 MIL/uL (ref 4.22–5.81)
RDW: 16.1 % — ABNORMAL HIGH (ref 11.5–15.5)
WBC: 3.5 10*3/uL — ABNORMAL LOW (ref 4.0–10.5)
nRBC: 0 % (ref 0.0–0.2)

## 2020-09-26 LAB — BASIC METABOLIC PANEL
Anion gap: 9 (ref 5–15)
BUN: 11 mg/dL (ref 6–20)
CO2: 24 mmol/L (ref 22–32)
Calcium: 9.8 mg/dL (ref 8.9–10.3)
Chloride: 105 mmol/L (ref 98–111)
Creatinine, Ser: 0.65 mg/dL (ref 0.61–1.24)
GFR, Estimated: >60 mL/min (ref >60)
Glucose, Bld: 92 mg/dL (ref 70–99)
Potassium: 4 mmol/L (ref 3.5–5.1)
Sodium: 138 mmol/L (ref 135–145)

## 2020-09-26 SURGERY — VAGAL NERVE STIMULATOR IMPLANT
Anesthesia: General | Site: Chest | Laterality: Left

## 2020-09-26 MED ORDER — THROMBIN 5000 UNITS EX SOLR
CUTANEOUS | Status: AC
  Filled 2020-09-26: qty 5000

## 2020-09-26 MED ORDER — PROPOFOL 10 MG/ML IV BOLUS
INTRAVENOUS | Status: AC
  Filled 2020-09-26: qty 40

## 2020-09-26 MED ORDER — CHLORHEXIDINE GLUCONATE CLOTH 2 % EX PADS: 6.0000 | MEDICATED_PAD | Freq: Once | CUTANEOUS | Status: DC

## 2020-09-26 MED ORDER — CHLORHEXIDINE GLUCONATE 0.12 % MT SOLN: 15.0000 mL | Freq: Once | OROMUCOSAL | Status: AC

## 2020-09-26 MED ORDER — DEXAMETHASONE SODIUM PHOSPHATE 10 MG/ML IJ SOLN
INTRAMUSCULAR | Status: DC | PRN
  Administered 2020-09-26: 5 mg via INTRAVENOUS

## 2020-09-26 MED ORDER — LIDOCAINE-EPINEPHRINE 1 %-1:100000 IJ SOLN
INTRAMUSCULAR | Status: DC | PRN
  Administered 2020-09-26: 2.5 mL

## 2020-09-26 MED ORDER — PHENYLEPHRINE 40 MCG/ML (10ML) SYRINGE FOR IV PUSH (FOR BLOOD PRESSURE SUPPORT)
PREFILLED_SYRINGE | INTRAVENOUS | Status: AC
  Filled 2020-09-26: qty 10

## 2020-09-26 MED ORDER — CEFAZOLIN SODIUM-DEXTROSE 2-4 GM/100ML-% IV SOLN
INTRAVENOUS | Status: AC
  Filled 2020-09-26: qty 100

## 2020-09-26 MED ORDER — SUGAMMADEX SODIUM 200 MG/2ML IV SOLN
INTRAVENOUS | Status: DC | PRN
  Administered 2020-09-26: 180 mg via INTRAVENOUS

## 2020-09-26 MED ORDER — MIDAZOLAM HCL 2 MG/2ML IJ SOLN
INTRAMUSCULAR | Status: AC
  Filled 2020-09-26: qty 2

## 2020-09-26 MED ORDER — DEXAMETHASONE SODIUM PHOSPHATE 10 MG/ML IJ SOLN
INTRAMUSCULAR | Status: AC
  Filled 2020-09-26: qty 1

## 2020-09-26 MED ORDER — ORAL CARE MOUTH RINSE: 15.0000 mL | Freq: Once | OROMUCOSAL | Status: AC

## 2020-09-26 MED ORDER — BUPIVACAINE HCL (PF) 0.5 % IJ SOLN
INTRAMUSCULAR | Status: AC
  Filled 2020-09-26: qty 30

## 2020-09-26 MED ORDER — CEFAZOLIN SODIUM-DEXTROSE 2-4 GM/100ML-% IV SOLN
2.0000 g | INTRAVENOUS | Status: AC
  Administered 2020-09-26: 2 g via INTRAVENOUS

## 2020-09-26 MED ORDER — CHLORHEXIDINE GLUCONATE 0.12 % MT SOLN
OROMUCOSAL | Status: AC
  Administered 2020-09-26: 15 mL via OROMUCOSAL
  Filled 2020-09-26: qty 15

## 2020-09-26 MED ORDER — EPHEDRINE 5 MG/ML INJ
INTRAVENOUS | Status: AC
  Filled 2020-09-26: qty 10

## 2020-09-26 MED ORDER — TRAMADOL HCL 50 MG PO TABS
50.0000 mg | ORAL_TABLET | Freq: Two times a day (BID) | ORAL | 0 refills | Status: AC | PRN
Start: 2020-09-26 — End: 2020-10-01

## 2020-09-26 MED ORDER — ONDANSETRON HCL 4 MG/2ML IJ SOLN
INTRAMUSCULAR | Status: DC | PRN
  Administered 2020-09-26: 4 mg via INTRAVENOUS

## 2020-09-26 MED ORDER — ONDANSETRON HCL 4 MG/2ML IJ SOLN
INTRAMUSCULAR | Status: AC
  Filled 2020-09-26: qty 2

## 2020-09-26 MED ORDER — METOPROLOL SUCCINATE ER 25 MG PO TB24: 100.0000 mg | ORAL_TABLET | Freq: Once | ORAL | Status: AC

## 2020-09-26 MED ORDER — LIDOCAINE-EPINEPHRINE 1 %-1:100000 IJ SOLN
INTRAMUSCULAR | Status: AC
  Filled 2020-09-26: qty 1

## 2020-09-26 MED ORDER — 0.9 % SODIUM CHLORIDE (POUR BTL) OPTIME
TOPICAL | Status: DC | PRN
  Administered 2020-09-26: 1000 mL

## 2020-09-26 MED ORDER — MIDAZOLAM HCL 2 MG/2ML IJ SOLN
INTRAMUSCULAR | Status: DC | PRN
  Administered 2020-09-26: 2 mg via INTRAVENOUS

## 2020-09-26 MED ORDER — LIDOCAINE 2% (20 MG/ML) 5 ML SYRINGE
INTRAMUSCULAR | Status: DC | PRN
  Administered 2020-09-26: 60 mg via INTRAVENOUS

## 2020-09-26 MED ORDER — FENTANYL CITRATE (PF) 250 MCG/5ML IJ SOLN
INTRAMUSCULAR | Status: AC
  Filled 2020-09-26: qty 5

## 2020-09-26 MED ORDER — LIDOCAINE HCL (PF) 2 % IJ SOLN
INTRAMUSCULAR | Status: AC
  Filled 2020-09-26: qty 5

## 2020-09-26 MED ORDER — ACETAMINOPHEN 500 MG PO TABS: 1000.0000 mg | ORAL_TABLET | Freq: Once | ORAL | Status: AC

## 2020-09-26 MED ORDER — EPHEDRINE SULFATE-NACL 50-0.9 MG/10ML-% IV SOSY
PREFILLED_SYRINGE | INTRAVENOUS | Status: DC | PRN
  Administered 2020-09-26: 10 mg via INTRAVENOUS

## 2020-09-26 MED ORDER — ROCURONIUM BROMIDE 10 MG/ML (PF) SYRINGE
PREFILLED_SYRINGE | INTRAVENOUS | Status: DC | PRN
  Administered 2020-09-26: 60 mg via INTRAVENOUS

## 2020-09-26 MED ORDER — FENTANYL CITRATE (PF) 250 MCG/5ML IJ SOLN
INTRAMUSCULAR | Status: DC | PRN
  Administered 2020-09-26 (×2): 50 ug via INTRAVENOUS

## 2020-09-26 MED ORDER — ACETAMINOPHEN 500 MG PO TABS
ORAL_TABLET | ORAL | Status: AC
  Administered 2020-09-26: 1000 mg via ORAL
  Filled 2020-09-26: qty 2

## 2020-09-26 MED ORDER — LACTATED RINGERS IV SOLN: INTRAVENOUS | Status: DC

## 2020-09-26 MED ORDER — PHENYLEPHRINE HCL-NACL 10-0.9 MG/250ML-% IV SOLN
INTRAVENOUS | Status: DC | PRN
  Administered 2020-09-26: 35 ug/min via INTRAVENOUS

## 2020-09-26 MED ORDER — FENTANYL CITRATE (PF) 100 MCG/2ML IJ SOLN: 25.0000 ug | INTRAMUSCULAR | Status: DC | PRN

## 2020-09-26 MED ORDER — BUPIVACAINE HCL 0.5 % IJ SOLN
INTRAMUSCULAR | Status: DC | PRN
  Administered 2020-09-26: 2.5 mL

## 2020-09-26 MED ORDER — PROPOFOL 10 MG/ML IV BOLUS
INTRAVENOUS | Status: DC | PRN
  Administered 2020-09-26: 130 mg via INTRAVENOUS

## 2020-09-26 MED ORDER — METOPROLOL SUCCINATE ER 25 MG PO TB24
ORAL_TABLET | ORAL | Status: AC
  Administered 2020-09-26: 100 mg via ORAL
  Filled 2020-09-26: qty 4

## 2020-09-26 MED ORDER — THROMBIN 5000 UNITS EX SOLR
OROMUCOSAL | Status: DC | PRN
  Administered 2020-09-26: 5 mL via TOPICAL

## 2020-09-26 SURGICAL SUPPLY — 54 items
BENZOIN TINCTURE PRP APPL 2/3 (GAUZE/BANDAGES/DRESSINGS) IMPLANT
BLADE CLIPPER SURG (BLADE) IMPLANT
BLADE SURG 11 STRL SS (BLADE) IMPLANT
CANISTER SUCT 3000ML PPV (MISCELLANEOUS) ×3 IMPLANT
CARTRIDGE OIL MAESTRO DRILL (MISCELLANEOUS) IMPLANT
COVER WAND RF STERILE (DRAPES) IMPLANT
DECANTER SPIKE VIAL GLASS SM (MISCELLANEOUS) IMPLANT
DERMABOND ADVANCED (GAUZE/BANDAGES/DRESSINGS) ×2
DERMABOND ADVANCED .7 DNX12 (GAUZE/BANDAGES/DRESSINGS) ×1 IMPLANT
DIFFUSER DRILL AIR PNEUMATIC (MISCELLANEOUS) IMPLANT
DRAPE CAMERA VIDEO/LASER (DRAPES) ×3 IMPLANT
DRAPE HALF SHEET 40X57 (DRAPES) IMPLANT
DRAPE LAPAROTOMY 100X72 PEDS (DRAPES) ×3 IMPLANT
DRSG OPSITE POSTOP 3X4 (GAUZE/BANDAGES/DRESSINGS) ×3 IMPLANT
DRSG TEGADERM 4X4.75 (GAUZE/BANDAGES/DRESSINGS) IMPLANT
DURAPREP 6ML APPLICATOR 50/CS (WOUND CARE) ×3 IMPLANT
ELECT COATED BLADE 2.86 ST (ELECTRODE) ×3 IMPLANT
ELECT REM PT RETURN 9FT ADLT (ELECTROSURGICAL) ×3
ELECTRODE REM PT RTRN 9FT ADLT (ELECTROSURGICAL) ×1 IMPLANT
GAUZE 4X4 16PLY RFD (DISPOSABLE) IMPLANT
GENERATOR MODEL 106 ASPIRE (Neuro Prosthesis/Implant) ×3 IMPLANT
GLOVE BIO SURGEON STRL SZ7.5 (GLOVE) ×3 IMPLANT
GLOVE BIOGEL PI IND STRL 6.5 (GLOVE) ×2 IMPLANT
GLOVE BIOGEL PI IND STRL 7.5 (GLOVE) ×2 IMPLANT
GLOVE BIOGEL PI INDICATOR 6.5 (GLOVE) ×4
GLOVE BIOGEL PI INDICATOR 7.5 (GLOVE) ×4
GLOVE ECLIPSE 7.0 STRL STRAW (GLOVE) ×3 IMPLANT
GLOVE SURG SS PI 6.0 STRL IVOR (GLOVE) ×6 IMPLANT
GOWN STRL REUS W/ TWL LRG LVL3 (GOWN DISPOSABLE) ×4 IMPLANT
GOWN STRL REUS W/ TWL XL LVL3 (GOWN DISPOSABLE) IMPLANT
GOWN STRL REUS W/TWL 2XL LVL3 (GOWN DISPOSABLE) IMPLANT
GOWN STRL REUS W/TWL LRG LVL3 (GOWN DISPOSABLE) ×8
GOWN STRL REUS W/TWL XL LVL3 (GOWN DISPOSABLE)
HEMOSTAT POWDER KIT SURGIFOAM (HEMOSTASIS) ×3 IMPLANT
KIT BASIN OR (CUSTOM PROCEDURE TRAY) ×3 IMPLANT
KIT TURNOVER KIT B (KITS) ×3 IMPLANT
LOOP VESSEL MAXI BLUE (MISCELLANEOUS) IMPLANT
LOOP VESSEL MINI RED (MISCELLANEOUS) IMPLANT
NEEDLE HYPO 22GX1.5 SAFETY (NEEDLE) ×3 IMPLANT
NS IRRIG 1000ML POUR BTL (IV SOLUTION) ×3 IMPLANT
OIL CARTRIDGE MAESTRO DRILL (MISCELLANEOUS)
PACK LAMINECTOMY NEURO (CUSTOM PROCEDURE TRAY) ×3 IMPLANT
PAD ARMBOARD 7.5X6 YLW CONV (MISCELLANEOUS) ×9 IMPLANT
SPONGE INTESTINAL PEANUT (DISPOSABLE) ×3 IMPLANT
SPONGE SURGIFOAM ABS GEL SZ50 (HEMOSTASIS) IMPLANT
SUT ETHILON 3 0 FSL (SUTURE) IMPLANT
SUT NURALON 4 0 TR CR/8 (SUTURE) ×3 IMPLANT
SUT VIC AB 0 CT1 27 (SUTURE) ×2
SUT VIC AB 0 CT1 27XBRD ANBCTR (SUTURE) ×1 IMPLANT
SUT VIC AB 3-0 SH 8-18 (SUTURE) ×3 IMPLANT
SUT VICRYL 3-0 RB1 18 ABS (SUTURE) ×3 IMPLANT
TOWEL GREEN STERILE (TOWEL DISPOSABLE) ×3 IMPLANT
TOWEL GREEN STERILE FF (TOWEL DISPOSABLE) ×3 IMPLANT
WATER STERILE IRR 1000ML POUR (IV SOLUTION) ×3 IMPLANT

## 2020-09-26 NOTE — Discharge Summary (Signed)
  Physician Discharge Summary  Patient ID: Seth Brock MRN: [id number] DOB/AGE: 02/26/1961 59 y.o.  Admit date: 09/26/2020 Discharge date: 09/26/2020  Admission Diagnoses:  VNS generator end-of-life Medically intractable epilepsy  Discharge Diagnoses:  Same Active Problems:   * No active hospital problems. *   Discharged Condition: Stable  Hospital Course:  Seth Brock is a 59 y.o. male who underwent uncomplicated replacement of a VNS generator. He was at baseline postop and discharged home from PACU in stable condition.  Treatments: Surgery - Replacement of VNS generator  Discharge Exam: Blood pressure (!) 144/78, pulse 75, temperature 98.1 F (36.7 C), temperature source Oral, resp. rate 17, height 6\' 1"  (1.854 m), weight 90.7 kg, SpO2 100 %. Awake, alert, oriented Speech fluent, appropriate CN grossly intact 5/5 BUE/BLE Wound c/d/i  Disposition: Discharge disposition: 01-Home or Self Care       Discharge Instructions    Call MD for:  redness, tenderness, or signs of infection (pain, swelling, redness, odor or green/yellow discharge around incision site)   Complete by: As directed    Call MD for:  temperature >100.4   Complete by: As directed    Diet - low sodium heart healthy   Complete by: As directed    Discharge instructions   Complete by: As directed    Walk at home as much as possible, at least 4 times / day   Increase activity slowly   Complete by: As directed    Lifting restrictions   Complete by: As directed    No lifting > 10 lbs   May shower / Bathe   Complete by: As directed    48 hours after surgery   May walk up steps   Complete by: As directed    Other Restrictions   Complete by: As directed    No bending/twisting at waist     Allergies as of 09/26/2020   No Known Allergies     Medication List    STOP taking these medications   atorvastatin 20 MG tablet Commonly known as: LIPITOR   LORazepam 1 MG tablet Commonly known as:  Ativan     TAKE these medications   diltiazem 240 MG 24 hr capsule Commonly known as: dilTIAZem CD Take 1 capsule (240 mg total) by mouth daily.   divalproex 500 MG 24 hr tablet Commonly known as: DEPAKOTE ER Take 2 tablets (1,000 mg total) by mouth 2 (two) times daily.   levETIRAcetam 1000 MG tablet Commonly known as: KEPPRA Take 2 tablets (2,000 mg total) by mouth 2 (two) times daily.   metoprolol succinate 100 MG 24 hr tablet Commonly known as: TOPROL-XL TAKE 1 TABLET (100 MG TOTAL) BY MOUTH DAILY. TAKE WITH OR IMMEDIATELY FOLLOWING A MEAL.   silver sulfADIAZINE 1 % cream Commonly known as: SILVADENE Apply 1 application topically daily.   traMADol 50 MG tablet Commonly known as: Ultram Take 1 tablet (50 mg total) by mouth every 12 (twelve) hours as needed for up to 5 days.   Vimpat 150 MG Tabs Generic drug: Lacosamide Take 2 tablets (300 mg total) by mouth 2 (two) times daily.       Follow-up Information    14/04/2020, MD In 3 weeks.   Specialty: Neurosurgery Why: For wound re-check Contact information: [address] [place] [phone]               Signed: [phone] 09/26/2020, 8:59 AM

## 2020-09-26 NOTE — Telephone Encounter (Signed)
He is having VNS battery replacement today, please give him a follow up appointment with me in one month and let VNS rep Aundra Millet know the date.

## 2020-09-26 NOTE — Op Note (Signed)
  NEUROSURGERY OPERATIVE NOTE   PREOP DIAGNOSIS:  Left VNS generator end-of-life   POSTOP DIAGNOSIS: Same  PROCEDURE: Replacement of left VNS generator  SURGEON: Dr. Lisbeth Renshaw, MD  ASSISTANT: Cindra Presume, PA-C  ANESTHESIA: General Endotracheal  EBL: Minimal  SPECIMENS: None  DRAINS: None  COMPLICATIONS: None immediate  CONDITION: Hemodynamically stable to PACU  HISTORY: Seth Brock is a 59 y.o. male who initially underwent placement of a left-sided vagal nerve stimulator several years ago for medically intractable epilepsy.  He has had good improvement in his seizure frequency and severity with the VNS therapy.  Most recent interrogation of the device at his neurologist office indicated near end-of-life of the generator.  He therefore presents for replacement of the battery.  The details of the surgery, risks, benefits, and expected postoperative course were all reviewed in detail with the patient.  After all his questions were answered informed consent was obtained and witnessed.  PROCEDURE IN DETAIL: The patient was brought to the operating room. After induction of general anesthesia, the patient was positioned on the operative table in the supine position. All pressure points were meticulously padded.  Previous left-sided infraclavicular skin incision was then marked out and prepped and draped in the usual sterile fashion.  After timeout was conducted, the previous incision was infiltrated with local anesthetic with epinephrine.  Incision was then made sharply and carried down through subcutaneous tissue.  The capsule surrounding the previous generator was then identified and opened.  The pulse generator was then explanted.  The lead was disconnected and the new generator was connected.  Interrogation of the device revealed lead impedance to be high.  We therefore disconnected the VNS lead, cleaned it surface, and reconnected the generator.  Lead impedance was then  normal.  The generator was reimplanted and heart rate detection calibration was performed with good accuracy.  The wound was then irrigated.  Wound was closed in multiple layers in standard fashion using a combination of interrupted 3-0 Vicryl stitches.  A layer of Dermabond was placed.  After the Dermabond dried, sterile dressing was placed.  At the end of the case all sponge, needle, instrument, and cottonoid counts were correct.  Patient was then transferred to the stretcher, extubated, and taken to the postanesthesia care unit in stable hemodynamic condition.   Lisbeth Renshaw, MD [facility]

## 2020-09-26 NOTE — Anesthesia Procedure Notes (Signed)
Procedure Name: Intubation Date/Time: 09/26/2020 8:01 AM Performed by: Rande Brunt, CRNA Pre-anesthesia Checklist: Patient identified, Emergency Drugs available, Suction available and Patient being monitored Patient Re-evaluated:Patient Re-evaluated prior to induction Oxygen Delivery Method: Circle System Utilized Preoxygenation: Pre-oxygenation with 100% oxygen Induction Type: IV induction Ventilation: Mask ventilation without difficulty Laryngoscope Size: Mac and 4 Grade View: Grade II Tube type: Oral Tube size: 7.5 mm Number of attempts: 1 Airway Equipment and Method: Stylet Placement Confirmation: ETT inserted through vocal cords under direct vision,  positive ETCO2 and breath sounds checked- equal and bilateral Secured at: 22 cm Tube secured with: Tape Dental Injury: Teeth and Oropharynx as per pre-operative assessment

## 2020-09-26 NOTE — Transfer of Care (Signed)
Immediate Anesthesia Transfer of Care Note  Patient: Seth Brock  Procedure(s) Performed: VAGAL NERVE STIMULATOR BATTERY REPLACEMENT (Left Chest)  Patient Location: PACU  Anesthesia Type:General  Level of Consciousness: awake, alert  and oriented  Airway & Oxygen Therapy: Patient Spontanous Breathing and Patient connected to face mask oxygen  Post-op Assessment: Report given to RN, Post -op Vital signs reviewed and stable and Patient moving all extremities  Post vital signs: Reviewed and stable  Last Vitals:  Vitals Value Taken Time  BP 123/81   Temp    Pulse 61 09/26/20 0900  Resp 12   SpO2 100 % 09/26/20 0900  Vitals shown include unvalidated device data.  Last Pain:  Vitals:   09/26/20 0626  TempSrc:   PainSc: 0-No pain         Complications: No complications documented.

## 2020-09-26 NOTE — Telephone Encounter (Signed)
I spoke to Cheyenne Adas who helps the patient with his appts and care. He has been provided with a follow up appt on 10/30/20 at 1pm (he will have him here early for check-in). I have also notified our VNS rep, Megan, who confirmed she will be at his appt.

## 2020-09-27 ENCOUNTER — Encounter (HOSPITAL_COMMUNITY): Payer: Self-pay | Admitting: Neurosurgery

## 2020-09-27 NOTE — Anesthesia Postprocedure Evaluation (Signed)
Anesthesia Post Note  Patient: Seth Brock  Procedure(s) Performed: VAGAL NERVE STIMULATOR BATTERY REPLACEMENT (Left Chest)     Patient location during evaluation: PACU Anesthesia Type: General Level of consciousness: awake and alert Pain management: pain level controlled Vital Signs Assessment: post-procedure vital signs reviewed and stable Respiratory status: spontaneous breathing, nonlabored ventilation, respiratory function stable and patient connected to nasal cannula oxygen Cardiovascular status: blood pressure returned to baseline and stable Postop Assessment: no apparent nausea or vomiting Anesthetic complications: no   No complications documented.  Last Vitals:  Vitals:   09/26/20 0915 09/26/20 0930  BP: 120/69 131/88  Pulse: 60 61  Resp: 16 19  Temp:  (!) 36.2 C  SpO2: 100% 100%    Last Pain:  Vitals:   09/26/20 0930  TempSrc:   PainSc: 0-No pain                 Seth Brock

## 2020-10-05 DIAGNOSIS — M86242 Subacute osteomyelitis, left hand: Secondary | ICD-10-CM | POA: Insufficient documentation

## 2020-10-23 DIAGNOSIS — T23362A Burn of third degree of back of left hand, initial encounter: Secondary | ICD-10-CM | POA: Insufficient documentation

## 2020-10-23 DIAGNOSIS — M25642 Stiffness of left hand, not elsewhere classified: Secondary | ICD-10-CM | POA: Insufficient documentation

## 2020-10-23 DIAGNOSIS — R600 Localized edema: Secondary | ICD-10-CM | POA: Insufficient documentation

## 2020-10-30 ENCOUNTER — Encounter: Payer: Self-pay | Admitting: Neurology

## 2020-10-30 ENCOUNTER — Ambulatory Visit: Payer: Self-pay | Admitting: Neurology

## 2020-12-05 ENCOUNTER — Ambulatory Visit: Payer: Medicaid Other | Admitting: Neurology

## 2020-12-19 ENCOUNTER — Ambulatory Visit: Payer: Medicaid Other | Admitting: Neurology

## 2020-12-19 ENCOUNTER — Encounter: Payer: Self-pay | Admitting: Neurology

## 2020-12-19 VITALS — BP 166/90 | HR 98 | Ht 73.0 in | Wt 205.0 lb

## 2020-12-19 DIAGNOSIS — F79 Unspecified intellectual disabilities: Secondary | ICD-10-CM

## 2020-12-19 DIAGNOSIS — Z9689 Presence of other specified functional implants: Secondary | ICD-10-CM

## 2020-12-19 DIAGNOSIS — G40119 Localization-related (focal) (partial) symptomatic epilepsy and epileptic syndromes with simple partial seizures, intractable, without status epilepticus: Secondary | ICD-10-CM | POA: Diagnosis not present

## 2020-12-19 DIAGNOSIS — G40209 Localization-related (focal) (partial) symptomatic epilepsy and epileptic syndromes with complex partial seizures, not intractable, without status epilepticus: Secondary | ICD-10-CM

## 2020-12-19 MED ORDER — VIMPAT 150 MG PO TABS: 2.0000 | ORAL_TABLET | Freq: Two times a day (BID) | ORAL | 4 refills | Status: DC

## 2020-12-19 MED ORDER — DIVALPROEX SODIUM ER 500 MG PO TB24: 1000.0000 mg | ORAL_TABLET | Freq: Two times a day (BID) | ORAL | 4 refills | Status: DC

## 2020-12-19 MED ORDER — LEVETIRACETAM 1000 MG PO TABS: 2000.0000 mg | ORAL_TABLET | Freq: Two times a day (BID) | ORAL | 4 refills | Status: DC
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
## 2020-12-23 LAB — CBC WITH DIFFERENTIAL
Basophils Absolute: 0.1 10*3/uL (ref 0.0–0.2)
Basos: 2 %
EOS (ABSOLUTE): 0.2 10*3/uL (ref 0.0–0.4)
Eos: 6 %
Hematocrit: 44.9 % (ref 37.5–51.0)
Hemoglobin: 13.9 g/dL (ref 13.0–17.7)
Immature Grans (Abs): 0 10*3/uL (ref 0.0–0.1)
Immature Granulocytes: 0 %
Lymphocytes Absolute: 1.6 10*3/uL (ref 0.7–3.1)
Lymphs: 44 %
MCH: 24.2 pg — ABNORMAL LOW (ref 26.6–33.0)
MCHC: 31 g/dL — ABNORMAL LOW (ref 31.5–35.7)
MCV: 78 fL — ABNORMAL LOW (ref 79–97)
Monocytes Absolute: 0.4 10*3/uL (ref 0.1–0.9)
Monocytes: 10 %
Neutrophils Absolute: 1.3 10*3/uL — ABNORMAL LOW (ref 1.4–7.0)
Neutrophils: 38 %
RBC: 5.74 x10E6/uL (ref 4.14–5.80)
RDW: 15.9 % — ABNORMAL HIGH (ref 11.6–15.4)
WBC: 3.5 10*3/uL (ref 3.4–10.8)

## 2020-12-23 LAB — COMPREHENSIVE METABOLIC PANEL
ALT: 14 IU/L (ref 0–44)
AST: 24 IU/L (ref 0–40)
Albumin/Globulin Ratio: 1.2 (ref 1.2–2.2)
Albumin: 4.8 g/dL (ref 3.8–4.9)
Alkaline Phosphatase: 67 IU/L (ref 44–121)
BUN/Creatinine Ratio: 8 — ABNORMAL LOW (ref 9–20)
BUN: 7 mg/dL (ref 6–24)
Bilirubin Total: 0.4 mg/dL (ref 0.0–1.2)
CO2: 21 mmol/L (ref 20–29)
Calcium: 10.8 mg/dL — ABNORMAL HIGH (ref 8.7–10.2)
Chloride: 104 mmol/L (ref 96–106)
Creatinine, Ser: 0.89 mg/dL (ref 0.76–1.27)
Globulin, Total: 4 g/dL (ref 1.5–4.5)
Glucose: 62 mg/dL — ABNORMAL LOW (ref 65–99)
Potassium: 4.5 mmol/L (ref 3.5–5.2)
Sodium: 141 mmol/L (ref 134–144)
Total Protein: 8.8 g/dL — ABNORMAL HIGH (ref 6.0–8.5)
eGFR: 100 mL/min/{1.73_m2} (ref >59)

## 2020-12-23 LAB — VALPROIC ACID LEVEL: Valproic Acid Lvl: 74 ug/mL (ref 50–100)

## 2020-12-23 LAB — LEVETIRACETAM LEVEL: Levetiracetam Lvl: 15.3 ug/mL (ref 10.0–40.0)

## 2020-12-23 LAB — TSH: TSH: 1.64 u[IU]/mL (ref 0.450–4.500)

## 2020-12-23 LAB — LACOSAMIDE: Lacosamide: <0.5 ug/mL — ABNORMAL LOW (ref 5.0–10.0)

## 2020-12-25 ENCOUNTER — Telehealth: Payer: Self-pay | Admitting: Neurology

## 2020-12-25 NOTE — Telephone Encounter (Signed)
I spoke to his caregiver/friend, Cheyenne Adas (on DPR). He is going to discuss the importance of taking his medications as prescribed. He will call us back if he sees any continued issues. He will also call us to report any seizure activity.

## 2020-12-25 NOTE — Telephone Encounter (Signed)
Please call patient and his caregiver, laboratory evaluation shows significantly decreased lacosamide level, and also significant drop of Keppra level from previous baseline of 79 to current 15.3  Please emphasized with patient the importance of compliance with his antiepileptic medications, is at high risk for recurrent seizure

## 2021-03-16 DIAGNOSIS — I10 Essential (primary) hypertension: Secondary | ICD-10-CM | POA: Diagnosis not present

## 2021-03-16 DIAGNOSIS — I499 Cardiac arrhythmia, unspecified: Secondary | ICD-10-CM | POA: Diagnosis not present

## 2021-03-16 DIAGNOSIS — R569 Unspecified convulsions: Secondary | ICD-10-CM | POA: Diagnosis not present

## 2021-03-16 DIAGNOSIS — R404 Transient alteration of awareness: Secondary | ICD-10-CM | POA: Diagnosis not present

## 2021-03-16 DIAGNOSIS — R410 Disorientation, unspecified: Secondary | ICD-10-CM | POA: Diagnosis not present

## 2021-03-21 ENCOUNTER — Telehealth: Payer: Self-pay | Admitting: *Deleted

## 2021-03-21 NOTE — Telephone Encounter (Signed)
PA for lacosamide 150mg  started on covermymeds (key: [id number]). Pt has coverage with [employer] [place] [phone]). (KM#[id number] approved through 03/21/2022.

## 2021-03-26 ENCOUNTER — Ambulatory Visit: Payer: Medicaid Other | Admitting: Neurology

## 2021-03-27 ENCOUNTER — Ambulatory Visit: Payer: Medicaid Other | Admitting: Neurology

## 2021-04-01 DIAGNOSIS — R569 Unspecified convulsions: Secondary | ICD-10-CM | POA: Diagnosis not present

## 2021-04-01 DIAGNOSIS — R0902 Hypoxemia: Secondary | ICD-10-CM | POA: Diagnosis not present

## 2021-04-01 DIAGNOSIS — W19XXXA Unspecified fall, initial encounter: Secondary | ICD-10-CM | POA: Diagnosis not present

## 2021-04-01 DIAGNOSIS — S0990XA Unspecified injury of head, initial encounter: Secondary | ICD-10-CM | POA: Diagnosis not present

## 2021-04-03 ENCOUNTER — Ambulatory Visit: Payer: Medicaid Other | Admitting: Neurology

## 2021-04-03 ENCOUNTER — Encounter: Payer: Self-pay | Admitting: Neurology

## 2021-04-03 ENCOUNTER — Other Ambulatory Visit: Payer: Self-pay | Admitting: Neurology

## 2021-04-03 ENCOUNTER — Ambulatory Visit (INDEPENDENT_AMBULATORY_CARE_PROVIDER_SITE_OTHER): Payer: Medicaid Other | Admitting: Neurology

## 2021-04-03 ENCOUNTER — Other Ambulatory Visit: Payer: Self-pay

## 2021-04-03 VITALS — BP 129/83 | HR 83 | Ht 73.0 in | Wt 202.5 lb

## 2021-04-03 DIAGNOSIS — F79 Unspecified intellectual disabilities: Secondary | ICD-10-CM | POA: Diagnosis not present

## 2021-04-03 DIAGNOSIS — Z9689 Presence of other specified functional implants: Secondary | ICD-10-CM | POA: Diagnosis not present

## 2021-04-03 DIAGNOSIS — G40209 Localization-related (focal) (partial) symptomatic epilepsy and epileptic syndromes with complex partial seizures, not intractable, without status epilepticus: Secondary | ICD-10-CM

## 2021-04-03 DIAGNOSIS — G40119 Localization-related (focal) (partial) symptomatic epilepsy and epileptic syndromes with simple partial seizures, intractable, without status epilepticus: Secondary | ICD-10-CM

## 2021-04-03 DIAGNOSIS — Z9114 Patient's other noncompliance with medication regimen: Secondary | ICD-10-CM | POA: Insufficient documentation
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
## 2021-04-06 LAB — LEVETIRACETAM LEVEL

## 2021-04-06 LAB — LACOSAMIDE

## 2021-04-06 LAB — VALPROIC ACID LEVEL: Valproic Acid Lvl: 94 ug/mL (ref 50–100)

## 2021-04-09 ENCOUNTER — Other Ambulatory Visit: Payer: Self-pay | Admitting: Neurology

## 2021-04-09 ENCOUNTER — Other Ambulatory Visit: Payer: Self-pay | Admitting: *Deleted

## 2021-04-09 ENCOUNTER — Other Ambulatory Visit (INDEPENDENT_AMBULATORY_CARE_PROVIDER_SITE_OTHER): Payer: Medicaid Other

## 2021-04-09 DIAGNOSIS — Z79899 Other long term (current) drug therapy: Secondary | ICD-10-CM | POA: Diagnosis not present

## 2021-04-09 DIAGNOSIS — R569 Unspecified convulsions: Secondary | ICD-10-CM | POA: Diagnosis not present

## 2021-04-09 DIAGNOSIS — Z0289 Encounter for other administrative examinations: Secondary | ICD-10-CM

## 2021-04-09 LAB — VALPROIC ACID LEVEL

## 2021-04-09 LAB — LACOSAMIDE

## 2021-04-09 LAB — LEVETIRACETAM LEVEL

## 2021-04-18 ENCOUNTER — Telehealth: Payer: Self-pay | Admitting: Neurology

## 2021-04-18 LAB — LEVETIRACETAM LEVEL: Levetiracetam Lvl: 24 ug/mL (ref 10.0–40.0)

## 2021-04-18 LAB — LACOSAMIDE: Lacosamide: <0.5 ug/mL — ABNORMAL LOW (ref 5.0–10.0)

## 2021-04-18 NOTE — Telephone Encounter (Signed)
I was unable to reach the patient. I spoke to his caregiver, Seth Brock. He makes sure the patient has his prescriptions available but is not able to watch over him daily. He says the patient takes his Keppra and Lamictal but for some reason feels he does not need the Vimpat. However, Seth Brock is reporting the patient continues to have breakthrough seizures. I suggested them putting the pills in a medication box rather than delivering them in separate bottles. He was agreeable to this plan. He is going to speak to the patient again about the importance of being compliant with all meds. Seth Brock is going to try to get the patient to call me back so that I can also speak to him directly about compliance.

## 2021-04-18 NOTE — Telephone Encounter (Signed)
Outpatient Encounter Medications as of 04/18/2021  Medication Sig   diltiazem (DILTIAZEM CD) 240 MG 24 hr capsule Take 1 capsule (240 mg total) by mouth daily.   divalproex (DEPAKOTE ER) 500 MG 24 hr tablet Take 2 tablets (1,000 mg total) by mouth 2 (two) times daily.   Lacosamide (VIMPAT) 150 MG TABS Take 2 tablets (300 mg total) by mouth 2 (two) times daily.   levETIRAcetam (KEPPRA) 1000 MG tablet Take 2 tablets (2,000 mg total) by mouth 2 (two) times daily.   metoprolol succinate (TOPROL-XL) 100 MG 24 hr tablet TAKE 1 TABLET (100 MG TOTAL) BY MOUTH DAILY. TAKE WITH OR IMMEDIATELY FOLLOWING A MEAL.    According to record, he should take lacosamide 150 mg 2 tablets twice a day, but the level was less than 0.5, in 2020, level was 12.8, this indicate he has not been compliant with his lacosamide, please call patient or his caregiver to clarify, emphasized importance of complying with these epileptic medications  Keppra and Depakote level was at his baseline,

## 2021-04-18 NOTE — Telephone Encounter (Addendum)
The patient called me back. I reviewed his medications and dosing instructions. He admitted to not taking Vimpat. Says he will start taking all three medications as prescribed. I also suggested a weekly or monthly pill box to make it easier for him. He and Mr. Earlene Plater will work on getting him more organized. He verbalized understanding of the importance of being compliant.

## 2021-05-26 ENCOUNTER — Emergency Department (HOSPITAL_COMMUNITY): Payer: Medicaid Other

## 2021-05-26 ENCOUNTER — Other Ambulatory Visit: Payer: Self-pay

## 2021-05-26 ENCOUNTER — Emergency Department (HOSPITAL_COMMUNITY)
Admission: EM | Admit: 2021-05-26 | Discharge: 2021-05-26 | Disposition: A | Discharge status: Home / Self Care | Payer: Medicaid Other | Attending: Emergency Medicine | Admitting: Emergency Medicine

## 2021-05-26 DIAGNOSIS — G40909 Epilepsy, unspecified, not intractable, without status epilepticus: Secondary | ICD-10-CM | POA: Diagnosis not present

## 2021-05-26 DIAGNOSIS — Z79899 Other long term (current) drug therapy: Secondary | ICD-10-CM | POA: Insufficient documentation

## 2021-05-26 DIAGNOSIS — X58XXXA Exposure to other specified factors, initial encounter: Secondary | ICD-10-CM | POA: Insufficient documentation

## 2021-05-26 DIAGNOSIS — I2510 Atherosclerotic heart disease of native coronary artery without angina pectoris: Secondary | ICD-10-CM | POA: Insufficient documentation

## 2021-05-26 DIAGNOSIS — S80212A Abrasion, left knee, initial encounter: Secondary | ICD-10-CM | POA: Insufficient documentation

## 2021-05-26 DIAGNOSIS — R404 Transient alteration of awareness: Secondary | ICD-10-CM | POA: Diagnosis not present

## 2021-05-26 DIAGNOSIS — Z981 Arthrodesis status: Secondary | ICD-10-CM | POA: Diagnosis not present

## 2021-05-26 DIAGNOSIS — R569 Unspecified convulsions: Secondary | ICD-10-CM

## 2021-05-26 DIAGNOSIS — M47812 Spondylosis without myelopathy or radiculopathy, cervical region: Secondary | ICD-10-CM | POA: Diagnosis not present

## 2021-05-26 DIAGNOSIS — I4891 Unspecified atrial fibrillation: Secondary | ICD-10-CM | POA: Diagnosis not present

## 2021-05-26 DIAGNOSIS — S60512A Abrasion of left hand, initial encounter: Secondary | ICD-10-CM | POA: Insufficient documentation

## 2021-05-26 DIAGNOSIS — F17210 Nicotine dependence, cigarettes, uncomplicated: Secondary | ICD-10-CM | POA: Insufficient documentation

## 2021-05-26 DIAGNOSIS — S0003XA Contusion of scalp, initial encounter: Secondary | ICD-10-CM | POA: Diagnosis not present

## 2021-05-26 DIAGNOSIS — S80912A Unspecified superficial injury of left knee, initial encounter: Secondary | ICD-10-CM | POA: Diagnosis present

## 2021-05-26 DIAGNOSIS — I10 Essential (primary) hypertension: Secondary | ICD-10-CM | POA: Insufficient documentation

## 2021-05-26 DIAGNOSIS — S60511A Abrasion of right hand, initial encounter: Secondary | ICD-10-CM | POA: Diagnosis not present

## 2021-05-26 DIAGNOSIS — S0083XA Contusion of other part of head, initial encounter: Secondary | ICD-10-CM | POA: Diagnosis not present

## 2021-05-26 DIAGNOSIS — S199XXA Unspecified injury of neck, initial encounter: Secondary | ICD-10-CM | POA: Diagnosis not present

## 2021-05-26 DIAGNOSIS — R410 Disorientation, unspecified: Secondary | ICD-10-CM | POA: Diagnosis not present

## 2021-05-26 LAB — VALPROIC ACID LEVEL: Valproic Acid Lvl: 43 ug/mL — ABNORMAL LOW (ref 50.0–100.0)

## 2021-05-26 LAB — CBC WITH DIFFERENTIAL/PLATELET
Abs Immature Granulocytes: 0.01 10*3/uL (ref 0.00–0.07)
Basophils Absolute: 0 10*3/uL (ref 0.0–0.1)
Basophils Relative: 1 %
Eosinophils Absolute: 0.2 10*3/uL (ref 0.0–0.5)
Eosinophils Relative: 4 %
HCT: 32.9 % — ABNORMAL LOW (ref 39.0–52.0)
Hemoglobin: 10.4 g/dL — ABNORMAL LOW (ref 13.0–17.0)
Immature Granulocytes: 0 %
Lymphocytes Relative: 51 %
Lymphs Abs: 1.9 10*3/uL (ref 0.7–4.0)
MCH: 24.7 pg — ABNORMAL LOW (ref 26.0–34.0)
MCHC: 31.6 g/dL (ref 30.0–36.0)
MCV: 78.1 fL — ABNORMAL LOW (ref 80.0–100.0)
Monocytes Absolute: 0.3 10*3/uL (ref 0.1–1.0)
Monocytes Relative: 9 %
Neutro Abs: 1.3 10*3/uL — ABNORMAL LOW (ref 1.7–7.7)
Neutrophils Relative %: 35 %
Platelets: 176 10*3/uL (ref 150–400)
RBC: 4.21 MIL/uL — ABNORMAL LOW (ref 4.22–5.81)
RDW: 16.4 % — ABNORMAL HIGH (ref 11.5–15.5)
WBC: 3.7 10*3/uL — ABNORMAL LOW (ref 4.0–10.5)
nRBC: 0 % (ref 0.0–0.2)

## 2021-05-26 LAB — COMPREHENSIVE METABOLIC PANEL
ALT: 12 U/L (ref 0–44)
AST: 16 U/L (ref 15–41)
Albumin: 3.3 g/dL — ABNORMAL LOW (ref 3.5–5.0)
Alkaline Phosphatase: 46 U/L (ref 38–126)
Anion gap: 9 (ref 5–15)
BUN: 13 mg/dL (ref 6–20)
CO2: 20 mmol/L — ABNORMAL LOW (ref 22–32)
Calcium: 9.5 mg/dL (ref 8.9–10.3)
Chloride: 107 mmol/L (ref 98–111)
Creatinine, Ser: 0.92 mg/dL (ref 0.61–1.24)
GFR, Estimated: >60 mL/min (ref >60)
Glucose, Bld: 84 mg/dL (ref 70–99)
Potassium: 3.7 mmol/L (ref 3.5–5.1)
Sodium: 136 mmol/L (ref 135–145)
Total Bilirubin: 0.5 mg/dL (ref 0.3–1.2)
Total Protein: 7.4 g/dL (ref 6.5–8.1)

## 2021-05-26 LAB — TROPONIN I (HIGH SENSITIVITY): Troponin I (High Sensitivity): 7 ng/L (ref <18)

## 2021-05-26 LAB — CBG MONITORING, ED: Glucose-Capillary: 100 mg/dL — ABNORMAL HIGH (ref 70–99)

## 2021-05-26 LAB — ETHANOL: Alcohol, Ethyl (B): <10 mg/dL (ref <10)

## 2021-05-26 LAB — MAGNESIUM: Magnesium: 1.8 mg/dL (ref 1.7–2.4)

## 2021-05-26 MED ORDER — VALPROATE SODIUM 100 MG/ML IV SOLN
1350.0000 mg | Freq: Every day | INTRAVENOUS | Status: AC
  Administered 2021-05-26: 1350 mg via INTRAVENOUS
  Filled 2021-05-26: qty 13.5

## 2021-05-26 NOTE — ED Notes (Signed)
Transported to CT 

## 2021-05-26 NOTE — Discharge Instructions (Signed)
You were evaluated in the Emergency Department and after careful evaluation, we did not find any emergent condition requiring admission or further testing in the hospital.  Your work-up today showed that your Depakote levels were subtherapeutic.  Please make sure to take your medicines as prescribed.  Please do not drive until cleared by your neurologist.  We strongly suggest that you follow-up with your neurologist.  Please return to the Emergency Department if you experience any worsening of your condition.  We encourage you to follow up with a primary care provider.  Thank you for allowing [place] to be a part of your care.

## 2021-05-26 NOTE — ED Notes (Signed)
Cleared for discharge. Pt is going home. Bus pass given to pt. Pt always goes home on a bus. Bus pass provided to pt.
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
## 2021-05-28 LAB — LEVETIRACETAM LEVEL: Levetiracetam Lvl: 11.6 ug/mL (ref 10.0–40.0)

## 2021-07-06 ENCOUNTER — Other Ambulatory Visit: Payer: Self-pay | Admitting: Neurology

## 2021-07-06 DIAGNOSIS — G40209 Localization-related (focal) (partial) symptomatic epilepsy and epileptic syndromes with complex partial seizures, not intractable, without status epilepticus: Secondary | ICD-10-CM

## 2021-07-10 ENCOUNTER — Telehealth: Payer: Self-pay | Admitting: Neurology

## 2021-07-10 NOTE — Telephone Encounter (Signed)
Pt's friend Molly Maduro called requesting refill for pt's Lacosamide 150 MG TABS. Pharmacy [id number] - [place], [place] - [address].

## 2021-07-10 NOTE — Telephone Encounter (Signed)
90-day with refills sent on 07/09/21. I called his friend on DPR back. He is aware to request pick up from the pharmacy.
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
## 2021-08-30 ENCOUNTER — Encounter: Payer: Self-pay | Admitting: Family

## 2021-08-30 ENCOUNTER — Other Ambulatory Visit: Payer: Self-pay

## 2021-08-30 ENCOUNTER — Ambulatory Visit (INDEPENDENT_AMBULATORY_CARE_PROVIDER_SITE_OTHER): Payer: Medicaid Other | Admitting: Family

## 2021-08-30 VITALS — BP 126/85 | HR 93 | Temp 98.3°F | Resp 15 | Ht 72.99 in | Wt 198.8 lb

## 2021-08-30 DIAGNOSIS — Z7689 Persons encountering health services in other specified circumstances: Secondary | ICD-10-CM | POA: Diagnosis not present

## 2021-08-30 DIAGNOSIS — Z23 Encounter for immunization: Secondary | ICD-10-CM

## 2021-08-30 DIAGNOSIS — R569 Unspecified convulsions: Secondary | ICD-10-CM | POA: Diagnosis not present

## 2021-08-30 NOTE — Patient Instructions (Signed)
Thank you for choosing Primary Care at [facility] for your medical home!    Seth Brock was seen by Rema Fendt, NP today.   Alonza Bogus primary care provider is Ricky Stabs, NP.   For the best care possible,  you should try to see Ricky Stabs, NP whenever you come to clinic.   We look forward to seeing you again soon!  If you have any questions about your visit today,  please call us at [phone]  Or feel free to reach your provider via MyChart.    Keeping you healthy   Get these tests Blood pressure- Have your blood pressure checked once a year by your healthcare provider.  Normal blood pressure is 120/80. Weight- Have your body mass index (BMI) calculated to screen for obesity.  BMI is a measure of body fat based on height and weight. You can also calculate your own BMI at https://www.west-esparza.com/. Cholesterol- Have your cholesterol checked regularly starting at age 74, sooner may be necessary if you have diabetes, high blood pressure, if a family member developed heart diseases at an early age or if you smoke.  Chlamydia, HIV, and other sexual transmitted disease- Get screened each year until the age of 33 then within three months of each new sexual partner. Diabetes- Have your blood sugar checked regularly if you have high blood pressure, high cholesterol, a family history of diabetes or if you are overweight.   Get these vaccines Flu shot- Every fall. Tetanus shot- Every 10 years. Menactra- Single dose; prevents meningitis.   Take these steps Don't smoke- If you do smoke, ask your healthcare provider about quitting. For tips on how to quit, go to www.smokefree.gov or call 1-800-QUIT-NOW. Be physically active- Exercise 5 days a week for at least 30 minutes.  If you are not already physically active start slow and gradually work up to 30 minutes of moderate physical activity.  Examples of moderate activity include walking briskly, mowing the yard, dancing, swimming  bicycling, etc. Eat a healthy diet- Eat a variety of healthy foods such as fruits, vegetables, low fat milk, low fat cheese, yogurt, lean meats, poultry, fish, beans, tofu, etc.  For more information on healthy eating, go to www.thenutritionsource.org Drink alcohol in moderation- Limit alcohol intake two drinks or less a day.  Never drink and drive. Dentist- Brush and floss teeth twice daily; visit your dentis twice a year. Depression-Your emotional health is as important as your physical health.  If you're feeling down, losing interest in things you normally enjoy please talk with your healthcare provider. Gun Safety- If you keep a gun in your home, keep it unloaded and with the safety lock on.  Bullets should be stored separately. Helmet use- Always wear a helmet when riding a motorcycle, bicycle, rollerblading or skateboarding. Safe sex- If you may be exposed to a sexually transmitted infection, use a condom Seat belts- Seat bels can save your life; always wear one. Smoke/Carbon Monoxide detectors- These detectors need to be installed on the appropriate level of your home.  Replace batteries at least once a year. Skin Cancer- When out in the sun, cover up and use sunscreen SPF 15 or higher. Violence- If anyone is threatening or hurting you, please tell your healthcare provider.

## 2021-08-30 NOTE — Progress Notes (Signed)
Pt presents to establish care, desires flu vaccine.  Flu vaccine administered in left deltoid. Pt tolerated injection well
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
## 2021-09-27 ENCOUNTER — Other Ambulatory Visit: Payer: Self-pay

## 2021-09-27 ENCOUNTER — Ambulatory Visit (INDEPENDENT_AMBULATORY_CARE_PROVIDER_SITE_OTHER): Payer: Medicaid Other | Admitting: Family

## 2021-09-27 VITALS — BP 123/80 | HR 74 | Temp 98.3°F | Resp 18 | Ht 72.99 in | Wt 194.8 lb

## 2021-09-27 DIAGNOSIS — Z130 Encounter for screening for diseases of the blood and blood-forming organs and certain disorders involving the immune mechanism: Secondary | ICD-10-CM | POA: Diagnosis not present

## 2021-09-27 DIAGNOSIS — Z1329 Encounter for screening for other suspected endocrine disorder: Secondary | ICD-10-CM

## 2021-09-27 DIAGNOSIS — Z0000 Encounter for general adult medical examination without abnormal findings: Secondary | ICD-10-CM | POA: Diagnosis not present

## 2021-09-27 DIAGNOSIS — Z131 Encounter for screening for diabetes mellitus: Secondary | ICD-10-CM

## 2021-09-27 DIAGNOSIS — Z13220 Encounter for screening for lipoid disorders: Secondary | ICD-10-CM | POA: Diagnosis not present

## 2021-09-27 DIAGNOSIS — Z1211 Encounter for screening for malignant neoplasm of colon: Secondary | ICD-10-CM

## 2021-09-27 DIAGNOSIS — Z13228 Encounter for screening for other metabolic disorders: Secondary | ICD-10-CM

## 2021-09-27 NOTE — Patient Instructions (Signed)

## 2021-09-27 NOTE — Progress Notes (Signed)
Pt presents for annual physical  

## 2021-09-28 ENCOUNTER — Other Ambulatory Visit: Payer: Self-pay | Admitting: Family

## 2021-09-28 DIAGNOSIS — E785 Hyperlipidemia, unspecified: Secondary | ICD-10-CM | POA: Insufficient documentation

## 2021-09-28 LAB — CMP14+EGFR
ALT: 7 IU/L (ref 0–44)
AST: 17 IU/L (ref 0–40)
Albumin/Globulin Ratio: 1.2 (ref 1.2–2.2)
Albumin: 4.2 g/dL (ref 3.8–4.9)
Alkaline Phosphatase: 64 IU/L (ref 44–121)
BUN/Creatinine Ratio: 23 — ABNORMAL HIGH (ref 9–20)
BUN: 20 mg/dL (ref 6–24)
Bilirubin Total: 0.5 mg/dL (ref 0.0–1.2)
CO2: 20 mmol/L (ref 20–29)
Calcium: 10.1 mg/dL (ref 8.7–10.2)
Chloride: 106 mmol/L (ref 96–106)
Creatinine, Ser: 0.87 mg/dL (ref 0.76–1.27)
Globulin, Total: 3.6 g/dL (ref 1.5–4.5)
Glucose: 130 mg/dL — ABNORMAL HIGH (ref 70–99)
Potassium: 4.1 mmol/L (ref 3.5–5.2)
Sodium: 142 mmol/L (ref 134–144)
Total Protein: 7.8 g/dL (ref 6.0–8.5)
eGFR: 101 mL/min/{1.73_m2} (ref >59)

## 2021-09-28 LAB — CBC
Hematocrit: 38.1 % (ref 37.5–51.0)
Hemoglobin: 12.2 g/dL — ABNORMAL LOW (ref 13.0–17.7)
MCH: 24.6 pg — ABNORMAL LOW (ref 26.6–33.0)
MCHC: 32 g/dL (ref 31.5–35.7)
MCV: 77 fL — ABNORMAL LOW (ref 79–97)
Platelets: 176 10*3/uL (ref 150–450)
RBC: 4.96 x10E6/uL (ref 4.14–5.80)
RDW: 16.5 % — ABNORMAL HIGH (ref 11.6–15.4)
WBC: 3.1 10*3/uL — ABNORMAL LOW (ref 3.4–10.8)

## 2021-09-28 LAB — LIPID PANEL
Chol/HDL Ratio: 7.4 ratio — ABNORMAL HIGH (ref 0.0–5.0)
Cholesterol, Total: 171 mg/dL (ref 100–199)
HDL: 23 mg/dL — ABNORMAL LOW (ref >39)
LDL Chol Calc (NIH): 124 mg/dL — ABNORMAL HIGH (ref 0–99)
Triglycerides: 130 mg/dL (ref 0–149)
VLDL Cholesterol Cal: 24 mg/dL (ref 5–40)

## 2021-09-28 LAB — TSH: TSH: 1.24 u[IU]/mL (ref 0.450–4.500)

## 2021-09-28 LAB — HEMOGLOBIN A1C
Est. average glucose Bld gHb Est-mCnc: 103 mg/dL
Hgb A1c MFr Bld: 5.2 % (ref 4.8–5.6)

## 2021-09-28 MED ORDER — ATORVASTATIN CALCIUM 20 MG PO TABS: 20.0000 mg | ORAL_TABLET | Freq: Every day | ORAL | 0 refills | Status: DC

## 2021-09-28 NOTE — Progress Notes (Signed)
Kidney function normal.   Liver function normal.   Thyroid function normal.   No diabetes.   Mild anemia. Recommendation to increase iron-rich foods in the diet such as green leafy vegetables. Also, consider over-the-counter iron pill.   Cholesterol higher than expected. High cholesterol may increase risk of heart attack and/or stroke. Consider eating more fruits, vegetables, and lean baked meats such as chicken or fish. Moderate intensity exercise at least 150 minutes as tolerated per week may help as well.   Begin Atorvastatin (Lipitor) for high cholesterol. Encouraged to recheck fasting cholesterol in 4 to 6 weeks.   The following is for provider reference only: The 10-year ASCVD risk score (Arnett DK, et al., 2019) is: 21.8%   Values used to calculate the score:     Age: 60 years     Sex: Male     Is Non-Hispanic African American: Yes     Diabetic: No     Tobacco smoker: Yes     Systolic Blood Pressure: 123 mmHg     Is BP treated: Yes     HDL Cholesterol: 23 mg/dL     Total Cholesterol: 171 mg/dL

## 2021-11-06 ENCOUNTER — Other Ambulatory Visit: Payer: Self-pay

## 2021-11-06 ENCOUNTER — Encounter: Payer: Self-pay | Admitting: Neurology

## 2021-11-06 ENCOUNTER — Ambulatory Visit: Payer: Medicaid Other | Admitting: Neurology

## 2021-11-06 VITALS — BP 152/90 | HR 111 | Ht 73.0 in | Wt 205.0 lb

## 2021-11-06 DIAGNOSIS — Z9689 Presence of other specified functional implants: Secondary | ICD-10-CM

## 2021-11-06 DIAGNOSIS — G40119 Localization-related (focal) (partial) symptomatic epilepsy and epileptic syndromes with simple partial seizures, intractable, without status epilepticus: Secondary | ICD-10-CM | POA: Diagnosis not present

## 2021-11-06 DIAGNOSIS — G40209 Localization-related (focal) (partial) symptomatic epilepsy and epileptic syndromes with complex partial seizures, not intractable, without status epilepticus: Secondary | ICD-10-CM

## 2021-11-06 MED ORDER — LEVETIRACETAM 1000 MG PO TABS: 2000.0000 mg | ORAL_TABLET | Freq: Two times a day (BID) | ORAL | 4 refills | Status: DC

## 2021-11-06 MED ORDER — DIVALPROEX SODIUM ER 500 MG PO TB24: 1000.0000 mg | ORAL_TABLET | Freq: Two times a day (BID) | ORAL | 4 refills | Status: DC

## 2021-11-06 NOTE — Progress Notes (Signed)
°  VNS:  Model 106 (replacement September 26, 2020 by Dr. Conchita Paris)  Generator Serial No. [id number]  Normal OutPut Current: 2.25 mA Signal Frequency: 30 Hz  Pulse Width:  500  sec Signal on time: 30 seconds Signal off time:  1.86minute   AutoStimulation: Output: 2.60mA Pulse width: 500 sec On time: 30 second  Magnetic Output Current: 2.75 mA Pulse Width: 500 sec Signal On Time:  60 Sec Diagnostic Impedance:     l confirmed the VNS settings. The VNS stimulator was interrogated and reprogrammed 3 settings as above [phone]
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
## 2021-11-06 NOTE — Patient Instructions (Signed)
We increased your VNS settings today Continue with Keppra, Vimpat, and Depakote  Return in 6 months

## 2021-12-24 ENCOUNTER — Other Ambulatory Visit: Payer: Self-pay | Admitting: Family

## 2021-12-24 DIAGNOSIS — E785 Hyperlipidemia, unspecified: Secondary | ICD-10-CM

## 2022-01-23 ENCOUNTER — Other Ambulatory Visit: Payer: Self-pay

## 2022-01-23 ENCOUNTER — Encounter (HOSPITAL_COMMUNITY): Payer: Self-pay

## 2022-01-23 ENCOUNTER — Emergency Department (HOSPITAL_COMMUNITY)
Admission: EM | Admit: 2022-01-23 | Discharge: 2022-01-23 | Disposition: A | Discharge status: Home / Self Care | Payer: Medicaid Other | Attending: Emergency Medicine | Admitting: Emergency Medicine

## 2022-01-23 DIAGNOSIS — S0091XA Abrasion of unspecified part of head, initial encounter: Secondary | ICD-10-CM | POA: Insufficient documentation

## 2022-01-23 DIAGNOSIS — I10 Essential (primary) hypertension: Secondary | ICD-10-CM | POA: Diagnosis not present

## 2022-01-23 DIAGNOSIS — R569 Unspecified convulsions: Secondary | ICD-10-CM | POA: Diagnosis not present

## 2022-01-23 DIAGNOSIS — D696 Thrombocytopenia, unspecified: Secondary | ICD-10-CM | POA: Insufficient documentation

## 2022-01-23 DIAGNOSIS — Z79899 Other long term (current) drug therapy: Secondary | ICD-10-CM | POA: Diagnosis not present

## 2022-01-23 DIAGNOSIS — Y92512 Supermarket, store or market as the place of occurrence of the external cause: Secondary | ICD-10-CM | POA: Diagnosis not present

## 2022-01-23 DIAGNOSIS — D72819 Decreased white blood cell count, unspecified: Secondary | ICD-10-CM | POA: Insufficient documentation

## 2022-01-23 DIAGNOSIS — G40909 Epilepsy, unspecified, not intractable, without status epilepticus: Secondary | ICD-10-CM | POA: Diagnosis not present

## 2022-01-23 DIAGNOSIS — W228XXA Striking against or struck by other objects, initial encounter: Secondary | ICD-10-CM | POA: Diagnosis not present

## 2022-01-23 DIAGNOSIS — I2510 Atherosclerotic heart disease of native coronary artery without angina pectoris: Secondary | ICD-10-CM | POA: Insufficient documentation

## 2022-01-23 DIAGNOSIS — R410 Disorientation, unspecified: Secondary | ICD-10-CM | POA: Diagnosis not present

## 2022-01-23 DIAGNOSIS — Z5181 Encounter for therapeutic drug level monitoring: Secondary | ICD-10-CM | POA: Diagnosis not present

## 2022-01-23 DIAGNOSIS — S0990XA Unspecified injury of head, initial encounter: Secondary | ICD-10-CM | POA: Diagnosis present

## 2022-01-23 LAB — COMPREHENSIVE METABOLIC PANEL
ALT: 15 U/L (ref 0–44)
AST: 23 U/L (ref 15–41)
Albumin: 3.9 g/dL (ref 3.5–5.0)
Alkaline Phosphatase: 54 U/L (ref 38–126)
Anion gap: 7 (ref 5–15)
BUN: 15 mg/dL (ref 6–20)
CO2: 25 mmol/L (ref 22–32)
Calcium: 10.2 mg/dL (ref 8.9–10.3)
Chloride: 107 mmol/L (ref 98–111)
Creatinine, Ser: 0.93 mg/dL (ref 0.61–1.24)
GFR, Estimated: >60 mL/min (ref >60)
Glucose, Bld: 79 mg/dL (ref 70–99)
Potassium: 4.4 mmol/L (ref 3.5–5.1)
Sodium: 139 mmol/L (ref 135–145)
Total Bilirubin: 0.6 mg/dL (ref 0.3–1.2)
Total Protein: 8.3 g/dL — ABNORMAL HIGH (ref 6.5–8.1)

## 2022-01-23 LAB — CBC WITH DIFFERENTIAL/PLATELET
Abs Immature Granulocytes: 0 10*3/uL (ref 0.00–0.07)
Basophils Absolute: 0.1 10*3/uL (ref 0.0–0.1)
Basophils Relative: 2 %
Eosinophils Absolute: 0.1 10*3/uL (ref 0.0–0.5)
Eosinophils Relative: 3 %
HCT: 40.8 % (ref 39.0–52.0)
Hemoglobin: 13.3 g/dL (ref 13.0–17.0)
Immature Granulocytes: 0 %
Lymphocytes Relative: 44 %
Lymphs Abs: 1.5 10*3/uL (ref 0.7–4.0)
MCH: 24.4 pg — ABNORMAL LOW (ref 26.0–34.0)
MCHC: 32.6 g/dL (ref 30.0–36.0)
MCV: 75 fL — ABNORMAL LOW (ref 80.0–100.0)
Monocytes Absolute: 0.4 10*3/uL (ref 0.1–1.0)
Monocytes Relative: 12 %
Neutro Abs: 1.3 10*3/uL — ABNORMAL LOW (ref 1.7–7.7)
Neutrophils Relative %: 39 %
Platelets: 114 10*3/uL — ABNORMAL LOW (ref 150–400)
RBC: 5.44 MIL/uL (ref 4.22–5.81)
RDW: 16.9 % — ABNORMAL HIGH (ref 11.5–15.5)
WBC: 3.4 10*3/uL — ABNORMAL LOW (ref 4.0–10.5)
nRBC: 0 % (ref 0.0–0.2)

## 2022-01-23 LAB — VALPROIC ACID LEVEL: Valproic Acid Lvl: 76 ug/mL (ref 50.0–100.0)

## 2022-01-23 NOTE — ED Notes (Signed)
Pt cleared for discharge by PA. Pt alert and oriented at time of discharge.  ? ?Discharge instructions discussed with pt. Pt verbalized understanding with no questions at this time. Pt to go home via bus.  ?

## 2022-01-23 NOTE — ED Provider Notes (Signed)
?[facility]-EMERGENCY DEPT ?Provider Note ? ? ?CSN: [id number] ?Arrival date & time: 01/23/22  1652 ? ?  ? ?History ? ?Chief Complaint  ?Patient presents with  ? Seizures  ? ? ?Seth Brock is a 61 y.o. male with past medical history of seizure disorder (on Depakote, Keppra, and lacosamide), hypertension, CAD, hyperlipidemia.  Presents to the emergency department with a chief complaint of seizure. ? ?Per EMS patient coming from grocery store with complaint of seizure.  Seizure was witnessed by few bystanders who reported it lasted for few minutes.  Patient hit his head while seizing.  Upon EMS arrival patient was postictal.   ? ?Upon my assessment patient is alert to person, place, and time.  Patient endorses compliance with his medication and denies any missed doses.  Patient denies any blood thinner use.  Patient denies any neck pain, back pain, headache, visual disturbance, numbness, weakness, saddle anesthesia, nausea, vomiting, abdominal pain, chest pain, shortness of breath. ? ? ?Seizures ? ?  ? ?Home Medications ?Prior to Admission medications   ?Medication Sig Start Date End Date Taking? Authorizing Provider  ?atorvastatin (LIPITOR) 20 MG tablet TAKE 1 TABLET BY MOUTH EVERY DAY 12/25/21   Rema Fendt, NP  ?divalproex (DEPAKOTE ER) 500 MG 24 hr tablet Take 2 tablets (1,000 mg total) by mouth 2 (two) times daily. 11/06/21   Glean Salvo, NP  ?Lacosamide 150 MG TABS Take 2 tablets (300 mg total) by mouth 2 (two) times daily. 07/09/21   Levert Feinstein, MD  ?levETIRAcetam (KEPPRA) 1000 MG tablet Take 2 tablets (2,000 mg total) by mouth 2 (two) times daily. 11/06/21   Glean Salvo, NP  ?   ? ?Allergies    ?Patient has no known allergies.   ? ?Review of Systems   ?Review of Systems  ?Constitutional:  Negative for chills and fever.  ?Eyes:  Negative for visual disturbance.  ?Respiratory:  Negative for shortness of breath.   ?Cardiovascular:  Negative for chest pain.  ?Gastrointestinal:  Negative for  abdominal pain, nausea and vomiting.  ?Musculoskeletal:  Negative for back pain and neck pain.  ?Skin:  Negative for color change and rash.  ?Neurological:  Positive for seizures. Negative for dizziness, tremors, syncope, facial asymmetry, speech difficulty, weakness, light-headedness, numbness and headaches.  ?Psychiatric/Behavioral:  Negative for confusion.   ? ?Physical Exam ?Updated Vital Signs ?BP 139/90   Pulse 75   Temp 98.7 ?F (37.1 ?C) (Oral)   Resp 18   Ht 6\' 2"  (1.88 m)   Wt 90.7 kg   SpO2 96%   BMI 25.68 kg/m?  ?Physical Exam ?Vitals and nursing note reviewed.  ?Constitutional:   ?   General: He is not in acute distress. ?   Appearance: He is not ill-appearing, toxic-appearing or diaphoretic.  ?HENT:  ?   Head: Normocephalic. Abrasion present. No raccoon eyes, Battle's sign, contusion, right periorbital erythema, left periorbital erythema or laceration.  ?   Jaw: No trismus, tenderness, swelling, pain on movement or malocclusion.  ? ?   Comments: Superficial abrasion as noted above ?   Mouth/Throat:  ?   Lips: Pink. No lesions.  ?   Mouth: Mucous membranes are moist. No injury, lacerations or oral lesions.  ?   Pharynx: Oropharynx is clear. Uvula midline. No pharyngeal swelling, oropharyngeal exudate, posterior oropharyngeal erythema or uvula swelling.  ?   Tonsils: No tonsillar exudate or tonsillar abscesses. 1+ on the right. 1+ on the left.  ?Eyes:  ?  General: No scleral icterus.    ?   Right eye: No discharge.     ?   Left eye: No discharge.  ?   Extraocular Movements: Extraocular movements intact.  ?   Conjunctiva/sclera: Conjunctivae normal.  ?   Pupils: Pupils are equal, round, and reactive to light.  ?Cardiovascular:  ?   Rate and Rhythm: Normal rate.  ?Pulmonary:  ?   Effort: Pulmonary effort is normal.  ?Musculoskeletal:  ?   Cervical back: Normal range of motion and neck supple. No swelling, edema, deformity, erythema, signs of trauma, lacerations, rigidity, spasms, torticollis,  tenderness, bony tenderness or crepitus. No pain with movement, spinous process tenderness or muscular tenderness. Normal range of motion.  ?   Thoracic back: No swelling, edema, deformity, signs of trauma, lacerations, spasms, tenderness or bony tenderness.  ?   Lumbar back: No swelling, edema, deformity, signs of trauma, lacerations, spasms, tenderness or bony tenderness.  ?   Comments: No midline tenderness or deformity to cervical, thoracic, or lumbar spine.  Patient has no tenderness, bony tenderness, or deformity to bilateral upper or lower extremities.  ?Skin: ?   General: Skin is warm and dry.  ?Neurological:  ?   General: No focal deficit present.  ?   Mental Status: He is alert and oriented to person, place, and time.  ?   GCS: GCS eye subscore is 4. GCS verbal subscore is 5. GCS motor subscore is 6.  ?   Cranial Nerves: Cranial nerves 2-12 are intact. No cranial nerve deficit, dysarthria or facial asymmetry.  ?   Sensory: Sensation is intact.  ?   Comments: Patient moves all limbs equally without difficulty.  Sensation to light touch grossly intact to bilateral upper and lower extremities  ?Psychiatric:     ?   Behavior: Behavior is cooperative.  ? ? ?ED Results / Procedures / Treatments   ?Labs ?(all labs ordered are listed, but only abnormal results are displayed) ?Labs Reviewed  ?COMPREHENSIVE METABOLIC PANEL - Abnormal; Notable for the following components:  ?    Result Value  ? Total Protein 8.3 (*)   ? All other components within normal limits  ?CBC WITH DIFFERENTIAL/PLATELET - Abnormal; Notable for the following components:  ? WBC 3.4 (*)   ? MCV 75.0 (*)   ? MCH 24.4 (*)   ? RDW 16.9 (*)   ? Platelets 114 (*)   ? Neutro Abs 1.3 (*)   ? All other components within normal limits  ?VALPROIC ACID LEVEL  ?LEVETIRACETAM LEVEL  ?LACOSAMIDE  ? ? ?EKG ?None ? ?Radiology ?No results found. ? ?Procedures ?Procedures  ? ? ?Medications Ordered in ED ?Medications - No data to display ? ?ED Course/ Medical  Decision Making/ A&P ?  ?                        ?Medical Decision Making ?Amount and/or Complexity of Data Reviewed ?Labs: ordered. ? ? ?Alert 61 year old male in no acute distress, nontoxic-appearing.  Presents to the emergency department with a chief complaint of seizure. ? ?Information obtained from patient and EMS.  Past medical records were reviewed including previous provider notes and labs.  Patient has medical history as outlined in HPI which complicates his care. ? ?With history of seizures reported compliance with medication suspect possible breakthrough seizure.  Will obtain  basic lab work as well as valproic acid, lacosamide, and Keppra level. ? ?I was contacted by patient's RN who  advised that patient had full body tonic-clonic seizure that lasted approximately 45 seconds when IV was initiated.  RN reports that patient was postictal immediately after seizure.  Upon my assessment patient is alert to person, place, and time.  Patient continues to deny any pain or discomfort.   ? ?I personally viewed and interpreted patient's lab results.  Pertinent findings include: ?-Thrombocytopenia at 114 and leukopenia at 3.4, appears baseline for patient. ? ?Throughout remainder of ED stay patient does not have any further seizures.  Patient hemodynamically stable at this time.  Will discharge patient to follow-up closely with [facility] neurology.  Patient vies to continue taking his prescribed medications.  Patient advised to refrain from driving or operating heavy machinery until he can be reevaluated by his neurologist. ? ?Discussed results, findings, treatment and follow up. Patient advised of return precautions. Patient verbalized understanding and agreed with plan. ? ?Patient care was discussed with attending physician Dr. Vallery Ridge ? ? ? ? ? ? ? ?Final Clinical Impression(s) / ED Diagnoses ?Final diagnoses:  ?None  ? ? ?Rx / DC Orders ?ED Discharge Orders   ? ? None  ? ?  ? ? ?  ?Loni Beckwith,  PA-C ?01/24/22 [id number] ? ?  ?Charlesetta Shanks, MD ?01/31/22 [phone] ? ?

## 2022-01-23 NOTE — ED Notes (Signed)
While RN starting an IV. Patient had seizure lasted for 45 seconds. O2sat dropped to 80%. O2 started at 6L. P.A. informed. Will continue to monitor. ?

## 2022-01-23 NOTE — ED Triage Notes (Addendum)
Pt coming from [facility] via EMS with c/o seizure. Patient had seizure witnessed by a few bystanders that stated that it lasted a few minutes. Patient hit head while seizing. Hx seizures, no blood thinners. Per EMS pt was postictal upon arrival. Patient now A&Ox3, not oriented to place at this time. Pt reports that he is taking his medications and has not missed any doses. ?

## 2022-01-23 NOTE — Discharge Instructions (Addendum)
You came to the emergency department today to be evaluated for your seizure.  You had another seizure in the emergency department.  Please continue to take your medications as prescribed.  Please follow-up with [facility] neurology for further management of your seizures. ? ?Your platelet count was slightly decreased on your lab results today.  Please follow-up with your primary care doctor to have this test repeated. ? ?Do not drive or operate any heavy machinery until you can be cleared by your neurologist. ? ?Get help right away if: ?You have: ?A seizure that does not stop after 5 minutes. ?Several seizures in a row without a complete recovery between seizures. ?A seizure that makes it harder to breathe. ?A seizure that leaves you unable to speak or use a part of your body. ?You do not wake up right away after a seizure. ?You injure yourself during a seizure. ?You have confusion or pain right after a seizure. ?

## 2022-01-24 ENCOUNTER — Telehealth: Payer: Self-pay

## 2022-01-24 LAB — LEVETIRACETAM LEVEL: Levetiracetam Lvl: 59.6 ug/mL — ABNORMAL HIGH (ref 10.0–40.0)

## 2022-01-24 NOTE — Telephone Encounter (Signed)
Transition Care Management Follow-up Telephone Call ?Date of discharge and from where: 12/23/2021- ?How have you been since you were released from the hospital? Pt stated he is doing fine.  ?Any questions or concerns? No ? ?Items Reviewed: ?Did the pt receive and understand the discharge instructions provided? Yes  ?Medications obtained and verified?  No new medicatios sent to pharmacy. ?Other? No  ?Any new allergies since your discharge? No  ?Dietary orders reviewed? No ?Do you have support at home? Yes  ? ?Home Care and Equipment/Supplies: ?Were home health services ordered? not applicable ?If so, what is the name of the agency? N/A  ?Has the agency set up a time to come to the patient's home? not applicable ?Were any new equipment or medical supplies ordered?  No ?What is the name of the medical supply agency? N/A ?Were you able to get the supplies/equipment? not applicable ?Do you have any questions related to the use of the equipment or supplies? No ? ?Functional Questionnaire: (I = Independent and D = Dependent) ?ADLs: I ? ?Bathing/Dressing- I ? ?Meal Prep- I ? ?Eating- I ? ?Maintaining continence- I ? ?Transferring/Ambulation- I ? ?Managing Meds- I ? ?Follow up appointments reviewed: ? ?PCP Hospital f/u appt confirmed? No   ?Specialist Hospital f/u appt confirmed? No   ?Are transportation arrangements needed? No  ?If their condition worsens, is the pt aware to call PCP or go to the Emergency Dept.? Yes ?Was the patient provided with contact information for the PCP's office or ED? Yes ?Was to pt encouraged to call back with questions or concerns? Yes  ?

## 2022-01-28 ENCOUNTER — Encounter: Payer: Self-pay | Admitting: Family

## 2022-01-29 LAB — LACOSAMIDE: Lacosamide: 1.1 ug/mL — ABNORMAL LOW (ref 5.0–10.0)
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
## 2022-03-27 ENCOUNTER — Encounter: Payer: Self-pay | Admitting: Family

## 2022-03-27 ENCOUNTER — Ambulatory Visit: Payer: Medicaid Other | Admitting: Family

## 2022-03-27 VITALS — BP 132/89 | HR 100 | Temp 98.3°F | Resp 16 | Ht 72.99 in | Wt 192.0 lb

## 2022-03-27 DIAGNOSIS — I10 Essential (primary) hypertension: Secondary | ICD-10-CM

## 2022-03-27 DIAGNOSIS — I4892 Unspecified atrial flutter: Secondary | ICD-10-CM | POA: Diagnosis not present

## 2022-03-27 DIAGNOSIS — I4891 Unspecified atrial fibrillation: Secondary | ICD-10-CM

## 2022-03-27 NOTE — Progress Notes (Signed)
.  Pt presents for hypertension f/u, has not been to cardiology since 2021  -refill needed on Cardizem per pt "if they want me to stay on it because they would not give to me at pharmacy" Has been out of medication according to pt for about 1 month and half

## 2022-04-14 ENCOUNTER — Other Ambulatory Visit: Payer: Self-pay | Admitting: Neurology

## 2022-04-14 DIAGNOSIS — G40209 Localization-related (focal) (partial) symptomatic epilepsy and epileptic syndromes with complex partial seizures, not intractable, without status epilepticus: Secondary | ICD-10-CM

## 2022-04-18 NOTE — Telephone Encounter (Signed)
Verify Drug Registry For Lacosamide 150 Mg Tablet Last Filled: 01/04/2022 Quantity: 360 tablets for 90 days Last appointment: 11/06/2021 Next appointment: 04/29/2022

## 2022-04-25 ENCOUNTER — Encounter: Payer: Self-pay | Admitting: Cardiovascular Disease

## 2022-04-25 NOTE — Progress Notes (Signed)
Cardiology Office Note:    Date:  04/26/2022   ID:  Seth Brock, DOB [birth year], MRN [id number]  PCP:  Seth Brock   [facility] Providers Cardiologist:  Seth Brock ( previous [place])   }    Referring MD: Seth Specter, PA-C   Chief Complaint  Patient presents with   Hypertension        Atrial Fibrillation          History of Present Illness:    Seth Brock is a 61 y.o. male with a hx of  CAD, HLD, HTN, atrial fib He was previously seen by Dr. Rosemary Brock)   He is in atrial flutter today Hx of HTN.  Hx of cognitive impairment , / mental retardation lives in a group home  His CHADS2VASC score is low - he has not been on anticoagulation   Notes suggest that he should be on diltiazem CD2 140 mg a day. He says he has not been taking the green and white capsule for a while.  He thinks that the provider who gave that to him has passed away.    I have reassured him that the doctors that he is seen in the past are still living but not sure who would have given that information  Heart rate and blood pressure are little on the high side.  I am inclined to restart Cardizem CD 240 mg a day.  Given his low CHA2DS2-VASc score, I am not inclined to start anticoagulation.  Past Medical History:  Diagnosis Date   Constipation    takes Miralax daily   Coronary artery disease    Epilepsy seizure, nonconvulsive, generalized (HCC)    FHx: atrial fibrillation    Hyperlipidemia    Hypertension    Irregular heart beat    Neurological impairment in adult    Seizures [facility])     Past Surgical History:  Procedure Laterality Date   None Listed     VAGUS NERVE STIMULATOR INSERTION Left 03/05/2016   Procedure: Left Vagal Nerve Stimulator Placement;  Surgeon: Seth Renshaw, MD;  Location: [facility];  Service: Neurosurgery;  Laterality: Left;  Left Vagal Nerve Stimulator placement   VAGUS NERVE STIMULATOR INSERTION Left 09/26/2020   Procedure: VAGAL NERVE  STIMULATOR BATTERY REPLACEMENT;  Surgeon: Seth Renshaw, MD;  Location: [facility] OR;  Service: Neurosurgery;  Laterality: Left;  anterior    Current Medications: Current Meds  Medication Sig   atorvastatin (LIPITOR) 20 MG tablet TAKE 1 TABLET BY MOUTH EVERY DAY   divalproex (DEPAKOTE ER) 500 MG 24 hr tablet Take 2 tablets (1,000 mg total) by mouth 2 (two) times daily.   Lacosamide 150 MG TABS TAKE 2 TABLETS BY MOUTH 2 TIMES DAILY.   levETIRAcetam (KEPPRA) 1000 MG tablet Take 2 tablets (2,000 mg total) by mouth 2 (two) times daily.   [DISCONTINUED] diltiazem (CARDIZEM CD) 240 MG 24 hr capsule Take 240 mg by mouth daily.     Allergies:   Patient has no known allergies.   Social History   Socioeconomic History   Marital status: Single    Spouse name: Not on file   Number of children: 0   Years of education: Not on file   Highest education level: Not on file  Occupational History   Not on file  Tobacco Use   Smoking status: Every Day    Types: Cigarettes    Passive exposure: Current   Smokeless tobacco: Never   Tobacco comments:    as  a teenager, 11/23/18 smoking 5-6 cigars daily  Vaping Use   Vaping Use: Never used  Substance and Sexual Activity   Alcohol use: Not Currently    Comment: occasionally   Drug use: No    Comment: patient quit 1993   Sexual activity: Not on file  Other Topics Concern   Not on file  Social History Narrative   11/23/18 Patient lives with friend       His mother passed away 07-20-2015 from a stroke.   Patient does not work.    Patient drinks caffeine daily. (Tea)   Social Determinants of Health   Financial Resource Strain: Not on file  Food Insecurity: Not on file  Transportation Needs: Not on file  Physical Activity: Not on file  Stress: Not on file  Social Connections: Not on file     Family History: The patient's family history is not on file.  ROS:   Please see the history of present illness.     All other systems reviewed and are  negative.  EKGs/Labs/Other Studies Reviewed:    The following studies were reviewed today:   EKG:  April 26, 2022: Atrial flutter with variable heart rate.  Heart rate is 99.  Recent Labs: 05/26/2021: Magnesium 1.8 09/27/2021: TSH 1.240 01/23/2022: ALT 15; BUN 15; Creatinine, Ser 0.93; Hemoglobin 13.3; Platelets 114; Potassium 4.4; Sodium 139  Recent Lipid Panel    Component Value Date/Time   CHOL 171 09/27/2021 0856   TRIG 130 09/27/2021 0856   HDL 23 (L) 09/27/2021 0856   CHOLHDL 7.4 (H) 09/27/2021 0856   CHOLHDL 10.2 (H) 11/27/2015 0916   VLDL 59 (H) 11/27/2015 0916   LDLCALC 124 (H) 09/27/2021 0856     Risk Assessment/Calculations:    CHA2DS2-VASc Score =    This indicates a  % annual risk of stroke. The patient's score is based upon:            Physical Exam:    VS:  BP 132/88   Pulse 99   Ht 6\' 2"  (1.88 m)   Wt 187 lb 6.4 oz (85 kg)   SpO2 97%   BMI 24.06 kg/m     Wt Readings from Last 3 Encounters:  04/26/22 187 lb 6.4 oz (85 kg)  03/27/22 192 lb (87.1 kg)  01/23/22 200 lb (90.7 kg)     GEN: middle age man,  obvious cognitive impairment   HEENT: Normal NECK: No JVD; No carotid bruits LYMPHATICS: No lymphadenopathy CARDIAC: IRR Irre RESPIRATORY:  Clear to auscultation without rales, wheezing or rhonchi  ABDOMEN: Soft, non-tender, non-distended MUSCULOSKELETAL:  No edema; No deformity  SKIN: Warm and dry NEUROLOGIC:  Alert and oriented x 3 PSYCHIATRIC:  Normal affect   ASSESSMENT:    1. Atrial flutter, unspecified type (HCC)    PLAN:    In order of problems listed above:  Atrial flutter: He has persistent atrial flutter.  Heart rate is 99.  In addition, his blood pressure is a little high.  It sounds like he is not on the Cardizem.  I am inclined to restart Cardizem CD 240 mg a day. Of asked him to bring all of his medications with him on his next visit.  We will see him again in 6 months for a follow-up visit.  2.  Hypertension: Blood  pressure is a little on the high side.  The Cardizem should help.             Medication Adjustments/Labs and Tests Ordered:  Current medicines are reviewed at length with the patient today.  Concerns regarding medicines are outlined above.  Orders Placed This Encounter  Procedures   EKG 12-Lead   Meds ordered this encounter  Medications   diltiazem (CARDIZEM CD) 240 MG 24 hr capsule    Sig: Take 1 capsule (240 mg total) by mouth daily.    Dispense:  30 capsule    Refill:  11    Patient Instructions  Medication Instructions:  START TAKING Diltiazem 240mg  capsule once daily *If you need a refill on your cardiac medications before your next appointment, please call your pharmacy*   Lab Work: NONE If you have labs (blood work) drawn today and your tests are completely normal, you will receive your results only by: MyChart Message (if you have MyChart) OR A paper copy in the mail If you have any lab test that is abnormal or we need to change your treatment, we will call you to review the results.   Testing/Procedures: NONE  Follow-Up: At [facility], you and your health needs are our priority.  As part of our continuing mission to provide you with exceptional heart care, we have created designated Provider Care Teams.  These Care Teams include your primary Cardiologist (physician) and Advanced Practice Providers (APPs -  Physician Assistants and Nurse Practitioners) who all work together to provide you with the care you need, when you need it.  Your next appointment:   6 month(s)  The format for your next appointment:   In Person  Provider:   Rakel Junio {      Important Information About Sugar         Signed, [facility], MD  04/26/2022 5:30 PM    [facility]

## 2022-04-26 ENCOUNTER — Ambulatory Visit: Payer: Medicaid Other | Admitting: Cardiovascular Disease

## 2022-04-26 ENCOUNTER — Encounter: Payer: Self-pay | Admitting: Cardiovascular Disease

## 2022-04-26 VITALS — BP 132/88 | HR 99 | Ht 74.0 in | Wt 187.4 lb

## 2022-04-26 DIAGNOSIS — I4892 Unspecified atrial flutter: Secondary | ICD-10-CM

## 2022-04-26 MED ORDER — DILTIAZEM HCL ER COATED BEADS 240 MG PO CP24: 240.0000 mg | ORAL_CAPSULE | Freq: Every day | ORAL | 11 refills | Status: DC

## 2022-04-26 NOTE — Patient Instructions (Signed)
Medication Instructions:  START TAKING Diltiazem 240mg  capsule once daily *If you need a refill on your cardiac medications before your next appointment, please call your pharmacy*   Lab Work: NONE If you have labs (blood work) drawn today and your tests are completely normal, you will receive your results only by: MyChart Message (if you have MyChart) OR A paper copy in the mail If you have any lab test that is abnormal or we need to change your treatment, we will call you to review the results.   Testing/Procedures: NONE  Follow-Up: At [facility], you and your health needs are our priority.  As part of our continuing mission to provide you with exceptional heart care, we have created designated Provider Care Teams.  These Care Teams include your primary Cardiologist (physician) and Advanced Practice Providers (APPs -  Physician Assistants and Nurse Practitioners) who all work together to provide you with the care you need, when you need it.  Your next appointment:   6 month(s)  The format for your next appointment:   In Person  Provider:   Nahser {      Important Information About Sugar

## 2022-04-29 ENCOUNTER — Encounter: Payer: Self-pay | Admitting: Neurology

## 2022-04-29 ENCOUNTER — Ambulatory Visit: Payer: Medicaid Other | Admitting: Neurology

## 2022-04-29 VITALS — BP 120/74 | HR 93 | Ht 74.0 in | Wt 188.0 lb

## 2022-04-29 DIAGNOSIS — Z91199 Patient's noncompliance with other medical treatment and regimen due to unspecified reason: Secondary | ICD-10-CM | POA: Insufficient documentation

## 2022-04-29 DIAGNOSIS — F79 Unspecified intellectual disabilities: Secondary | ICD-10-CM

## 2022-04-29 DIAGNOSIS — Z9689 Presence of other specified functional implants: Secondary | ICD-10-CM | POA: Diagnosis not present

## 2022-04-29 DIAGNOSIS — G40209 Localization-related (focal) (partial) symptomatic epilepsy and epileptic syndromes with complex partial seizures, not intractable, without status epilepticus: Secondary | ICD-10-CM
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
## 2022-05-01 LAB — LACOSAMIDE: Lacosamide: 6.5 ug/mL (ref 5.0–10.0)

## 2022-05-07 ENCOUNTER — Ambulatory Visit: Payer: Medicaid Other | Admitting: Neurology

## 2022-05-27 ENCOUNTER — Ambulatory Visit: Payer: Medicaid Other | Admitting: Cardiovascular Disease

## 2022-07-06 ENCOUNTER — Other Ambulatory Visit: Payer: Self-pay | Admitting: Family

## 2022-07-06 DIAGNOSIS — E785 Hyperlipidemia, unspecified: Secondary | ICD-10-CM

## 2022-07-08 NOTE — Telephone Encounter (Signed)
Requested Prescriptions  Pending Prescriptions Disp Refills  . atorvastatin (LIPITOR) 20 MG tablet [Pharmacy Med Name: ATORVASTATIN 20 MG TABLET] 90 tablet 1    Sig: TAKE 1 TABLET BY MOUTH EVERY DAY     Cardiovascular:  Antilipid - Statins Failed - 07/06/2022  3:14 PM      Failed - Lipid Panel in normal range within the last 12 months    Cholesterol, Total  Date Value Ref Range Status  09/27/2021 171 100 - 199 mg/dL Final   LDL Chol Calc (NIH)  Date Value Ref Range Status  09/27/2021 124 (H) 0 - 99 mg/dL Final   HDL  Date Value Ref Range Status  09/27/2021 23 (L) >39 mg/dL Final   Triglycerides  Date Value Ref Range Status  09/27/2021 130 0 - 149 mg/dL Final         Passed - Patient is not pregnant      Passed - Valid encounter within last 12 months    Recent Outpatient Visits          3 months ago Essential (primary) hypertension   [facility] at [facility], Amy J, NP   9 months ago Annual physical exam   Primary [facility] at [facility], [place], NP   10 months ago Encounter to establish care   Primary Care at [facility], [place], NP   3 years ago Essential hypertension   [place], Roger David, PA-C   5 years ago Essential hypertension   [place], Maylon Peppers, FNP      Future Appointments            In 3 months Nahser, Wonda Cheng, MD [place]. [facility], [facility]

## 2022-10-09 ENCOUNTER — Encounter: Payer: Self-pay | Admitting: Cardiovascular Disease

## 2022-10-09 ENCOUNTER — Ambulatory Visit: Payer: Medicaid Other | Admitting: Cardiovascular Disease

## 2022-10-09 NOTE — Progress Notes (Signed)
No show  This encounter was created in error - please disregard.

## 2022-10-10 ENCOUNTER — Ambulatory Visit: Payer: Medicaid Other | Attending: Cardiovascular Disease | Admitting: Cardiovascular Disease

## 2022-10-11 ENCOUNTER — Telehealth: Payer: Self-pay | Admitting: Cardiovascular Disease

## 2022-10-11 ENCOUNTER — Encounter (HOSPITAL_COMMUNITY): Payer: Self-pay

## 2022-10-11 ENCOUNTER — Inpatient Hospital Stay (HOSPITAL_COMMUNITY)
Admission: EM | Admit: 2022-10-11 | Discharge: 2022-10-19 | DRG: 101 | Disposition: A | Discharge status: Skilled Nursing Facility | Payer: Medicaid Other | Attending: Internal Medicine | Admitting: Internal Medicine

## 2022-10-11 ENCOUNTER — Telehealth: Payer: Self-pay | Admitting: Neurology

## 2022-10-11 ENCOUNTER — Emergency Department (HOSPITAL_COMMUNITY): Payer: Medicaid Other

## 2022-10-11 ENCOUNTER — Other Ambulatory Visit: Payer: Self-pay

## 2022-10-11 DIAGNOSIS — R001 Bradycardia, unspecified: Secondary | ICD-10-CM | POA: Diagnosis not present

## 2022-10-11 DIAGNOSIS — I4892 Unspecified atrial flutter: Secondary | ICD-10-CM | POA: Diagnosis present

## 2022-10-11 DIAGNOSIS — I2510 Atherosclerotic heart disease of native coronary artery without angina pectoris: Secondary | ICD-10-CM | POA: Diagnosis present

## 2022-10-11 DIAGNOSIS — R569 Unspecified convulsions: Secondary | ICD-10-CM

## 2022-10-11 DIAGNOSIS — E8889 Other specified metabolic disorders: Secondary | ICD-10-CM | POA: Diagnosis present

## 2022-10-11 DIAGNOSIS — I10 Essential (primary) hypertension: Secondary | ICD-10-CM | POA: Diagnosis present

## 2022-10-11 DIAGNOSIS — E860 Dehydration: Secondary | ICD-10-CM | POA: Diagnosis present

## 2022-10-11 DIAGNOSIS — E785 Hyperlipidemia, unspecified: Secondary | ICD-10-CM | POA: Diagnosis present

## 2022-10-11 DIAGNOSIS — E871 Hypo-osmolality and hyponatremia: Secondary | ICD-10-CM | POA: Diagnosis present

## 2022-10-11 DIAGNOSIS — G40909 Epilepsy, unspecified, not intractable, without status epilepticus: Principal | ICD-10-CM | POA: Diagnosis present

## 2022-10-11 DIAGNOSIS — J101 Influenza due to other identified influenza virus with other respiratory manifestations: Secondary | ICD-10-CM | POA: Diagnosis not present

## 2022-10-11 DIAGNOSIS — Z1152 Encounter for screening for COVID-19: Secondary | ICD-10-CM

## 2022-10-11 DIAGNOSIS — D696 Thrombocytopenia, unspecified: Secondary | ICD-10-CM | POA: Diagnosis present

## 2022-10-11 DIAGNOSIS — D72819 Decreased white blood cell count, unspecified: Secondary | ICD-10-CM | POA: Diagnosis present

## 2022-10-11 DIAGNOSIS — R4182 Altered mental status, unspecified: Secondary | ICD-10-CM | POA: Diagnosis not present

## 2022-10-11 DIAGNOSIS — Z79899 Other long term (current) drug therapy: Secondary | ICD-10-CM

## 2022-10-11 DIAGNOSIS — D6959 Other secondary thrombocytopenia: Secondary | ICD-10-CM | POA: Diagnosis present

## 2022-10-11 DIAGNOSIS — E162 Hypoglycemia, unspecified: Secondary | ICD-10-CM | POA: Insufficient documentation

## 2022-10-11 DIAGNOSIS — R42 Dizziness and giddiness: Secondary | ICD-10-CM | POA: Diagnosis present

## 2022-10-11 DIAGNOSIS — Z9181 History of falling: Secondary | ICD-10-CM

## 2022-10-11 DIAGNOSIS — R410 Disorientation, unspecified: Principal | ICD-10-CM

## 2022-10-11 DIAGNOSIS — F79 Unspecified intellectual disabilities: Secondary | ICD-10-CM

## 2022-10-11 DIAGNOSIS — K5900 Constipation, unspecified: Secondary | ICD-10-CM | POA: Diagnosis present

## 2022-10-11 DIAGNOSIS — I4891 Unspecified atrial fibrillation: Secondary | ICD-10-CM | POA: Diagnosis present

## 2022-10-11 DIAGNOSIS — F17210 Nicotine dependence, cigarettes, uncomplicated: Secondary | ICD-10-CM | POA: Diagnosis present

## 2022-10-11 DIAGNOSIS — I4430 Unspecified atrioventricular block: Secondary | ICD-10-CM | POA: Diagnosis present

## 2022-10-11 LAB — COMPREHENSIVE METABOLIC PANEL
ALT: 21 U/L (ref 0–44)
AST: 58 U/L — ABNORMAL HIGH (ref 15–41)
Albumin: 4.2 g/dL (ref 3.5–5.0)
Alkaline Phosphatase: 48 U/L (ref 38–126)
Anion gap: 13 (ref 5–15)
BUN: 20 mg/dL (ref 6–20)
CO2: 25 mmol/L (ref 22–32)
Calcium: 10.5 mg/dL — ABNORMAL HIGH (ref 8.9–10.3)
Chloride: 96 mmol/L — ABNORMAL LOW (ref 98–111)
Creatinine, Ser: 1.11 mg/dL (ref 0.61–1.24)
GFR, Estimated: >60 mL/min (ref >60)
Glucose, Bld: 58 mg/dL — ABNORMAL LOW (ref 70–99)
Potassium: 4.1 mmol/L (ref 3.5–5.1)
Sodium: 134 mmol/L — ABNORMAL LOW (ref 135–145)
Total Bilirubin: 0.8 mg/dL (ref 0.3–1.2)
Total Protein: 9.6 g/dL — ABNORMAL HIGH (ref 6.5–8.1)

## 2022-10-11 LAB — CBC WITH DIFFERENTIAL/PLATELET
Abs Immature Granulocytes: 0.01 10*3/uL (ref 0.00–0.07)
Basophils Absolute: 0 10*3/uL (ref 0.0–0.1)
Basophils Relative: 0 %
Eosinophils Absolute: 0 10*3/uL (ref 0.0–0.5)
Eosinophils Relative: 0 %
HCT: 45.5 % (ref 39.0–52.0)
Hemoglobin: 14.9 g/dL (ref 13.0–17.0)
Immature Granulocytes: 0 %
Lymphocytes Relative: 22 %
Lymphs Abs: 0.8 10*3/uL (ref 0.7–4.0)
MCH: 25 pg — ABNORMAL LOW (ref 26.0–34.0)
MCHC: 32.7 g/dL (ref 30.0–36.0)
MCV: 76.3 fL — ABNORMAL LOW (ref 80.0–100.0)
Monocytes Absolute: 0.3 10*3/uL (ref 0.1–1.0)
Monocytes Relative: 8 %
Neutro Abs: 2.6 10*3/uL (ref 1.7–7.7)
Neutrophils Relative %: 70 %
Platelets: 72 10*3/uL — ABNORMAL LOW (ref 150–400)
RBC: 5.96 MIL/uL — ABNORMAL HIGH (ref 4.22–5.81)
RDW: 16 % — ABNORMAL HIGH (ref 11.5–15.5)
WBC: 3.8 10*3/uL — ABNORMAL LOW (ref 4.0–10.5)
nRBC: 0 % (ref 0.0–0.2)

## 2022-10-11 LAB — URINALYSIS, ROUTINE W REFLEX MICROSCOPIC
Bilirubin Urine: NEGATIVE
Glucose, UA: NEGATIVE mg/dL
Hgb urine dipstick: NEGATIVE
Ketones, ur: 20 mg/dL — AB
Leukocytes,Ua: NEGATIVE
Nitrite: NEGATIVE
Protein, ur: NEGATIVE mg/dL
Specific Gravity, Urine: 1.017 (ref 1.005–1.030)
pH: 5 (ref 5.0–8.0)

## 2022-10-11 LAB — RESP PANEL BY RT-PCR (RSV, FLU A&B, COVID)  RVPGX2
Influenza A by PCR: POSITIVE — AB
Influenza B by PCR: NEGATIVE
Resp Syncytial Virus by PCR: NEGATIVE
SARS Coronavirus 2 by RT PCR: NEGATIVE

## 2022-10-11 LAB — CBG MONITORING, ED: Glucose-Capillary: 71 mg/dL (ref 70–99)

## 2022-10-11 MED ORDER — LEVETIRACETAM 500 MG PO TABS
1000.0000 mg | ORAL_TABLET | Freq: Once | ORAL | Status: AC
  Administered 2022-10-11: 1000 mg via ORAL
  Filled 2022-10-11: qty 2

## 2022-10-11 NOTE — Telephone Encounter (Signed)
I returned call from answering service to [facility] calling about the patient.  The call went into voicemail I left message for her to call me back

## 2022-10-11 NOTE — Telephone Encounter (Signed)
Pt c/o of Chest Pain: STAT if CP now or developed within 24 hours  1. Are you having CP right now?  Unknown  2. Are you experiencing any other symptoms (ex. SOB, nausea, vomiting, sweating)? Sweating  3. How long have you been experiencing CP?  Yesterday  4. Is your CP continuous or coming and going?  Unknown  5. Have you taken Nitroglycerin?  No   Caller stated patient was found weak, lethagic and his appointment yesterday.  Caller stated the patient had to have help going down steps.  Caller provided alternate phone number [phone] to reach her.

## 2022-10-11 NOTE — Telephone Encounter (Signed)
I returned phone call from Jinny Sanders regarding patient Seth Brock who apparently has had a bunch of seizures in the last several days and he has been confused and disoriented.  I recommend he be brought to [facility] ER for evaluation by neurologist on-call for adjustment of medications and possible admission if necessary.  She voiced understanding
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
## 2022-10-11 NOTE — ED Provider Triage Note (Cosign Needed Addendum)
Emergency Medicine Provider Triage Evaluation Note  Seth Brock , a 61 y.o. male  was evaluated in triage.  Pt complains of increased seizure activity.  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  No recent changes in medication, has been taking them daily.  Friend with patient states the landlord told him patient has had repetitive speech, has been sluggish, confused, and more unsteady than usual.  Patient has at times been only oriented to self and time.  Denies fever, chest pain, shortness of breath, chills.    Review of Systems  Positive: As above Negative: As above  Physical Exam  BP 119/73 (BP Location: Right Arm)   Pulse (!) 105   Temp 98.9 F (37.2 C) (Oral)   Resp 18   Ht 6\' 2"  (1.88 m)   Wt 85.3 kg   SpO2 96%   BMI 24.14 kg/m  Gen:   Awake, no distress   Resp:  Normal effort, rales and rhonchi auscultated, right worse than left MSK:   Moves extremities without difficulty  Other:  Not answering questions appropriately, unsure if baseline  Medical Decision Making  Medically screening exam initiated at 6:11 PM.  Appropriate orders placed.  Breandan People was informed that the remainder of the evaluation will be completed by another provider, this initial triage assessment does not replace that evaluation, and the importance of remaining in the ED until their evaluation is complete.     [name] R, Melton Alar 10/11/22 1814    10/13/22, Lenard Simmer 10/11/22 [phone]

## 2022-10-11 NOTE — ED Triage Notes (Signed)
Pt states he has been having seizures for past few days and feeling shaky.  Pt has repetitive speech.  Friend with pt states pt is more sluggish and unsteady than usual. Pt has confusion, oriented to self and time.

## 2022-10-12 ENCOUNTER — Emergency Department (HOSPITAL_COMMUNITY): Payer: Medicaid Other

## 2022-10-12 DIAGNOSIS — J101 Influenza due to other identified influenza virus with other respiratory manifestations: Secondary | ICD-10-CM | POA: Insufficient documentation

## 2022-10-12 DIAGNOSIS — R4182 Altered mental status, unspecified: Secondary | ICD-10-CM

## 2022-10-12 DIAGNOSIS — E162 Hypoglycemia, unspecified: Secondary | ICD-10-CM | POA: Insufficient documentation

## 2022-10-12 DIAGNOSIS — D696 Thrombocytopenia, unspecified: Secondary | ICD-10-CM | POA: Diagnosis not present

## 2022-10-12 DIAGNOSIS — I4892 Unspecified atrial flutter: Secondary | ICD-10-CM | POA: Diagnosis not present

## 2022-10-12 DIAGNOSIS — R569 Unspecified convulsions: Secondary | ICD-10-CM | POA: Diagnosis not present

## 2022-10-12 LAB — COMPREHENSIVE METABOLIC PANEL
ALT: 18 U/L (ref 0–44)
AST: 46 U/L — ABNORMAL HIGH (ref 15–41)
Albumin: 3.3 g/dL — ABNORMAL LOW (ref 3.5–5.0)
Alkaline Phosphatase: 43 U/L (ref 38–126)
Anion gap: 12 (ref 5–15)
BUN: 19 mg/dL (ref 6–20)
CO2: 24 mmol/L (ref 22–32)
Calcium: 9.6 mg/dL (ref 8.9–10.3)
Chloride: 98 mmol/L (ref 98–111)
Creatinine, Ser: 0.98 mg/dL (ref 0.61–1.24)
GFR, Estimated: >60 mL/min (ref >60)
Glucose, Bld: 55 mg/dL — ABNORMAL LOW (ref 70–99)
Potassium: 3.9 mmol/L (ref 3.5–5.1)
Sodium: 134 mmol/L — ABNORMAL LOW (ref 135–145)
Total Bilirubin: 0.8 mg/dL (ref 0.3–1.2)
Total Protein: 8.1 g/dL (ref 6.5–8.1)

## 2022-10-12 LAB — CBC
HCT: 39 % (ref 39.0–52.0)
Hemoglobin: 12.9 g/dL — ABNORMAL LOW (ref 13.0–17.0)
MCH: 25 pg — ABNORMAL LOW (ref 26.0–34.0)
MCHC: 33.1 g/dL (ref 30.0–36.0)
MCV: 75.6 fL — ABNORMAL LOW (ref 80.0–100.0)
Platelets: 67 10*3/uL — ABNORMAL LOW (ref 150–400)
RBC: 5.16 MIL/uL (ref 4.22–5.81)
RDW: 15.4 % (ref 11.5–15.5)
WBC: 3.7 10*3/uL — ABNORMAL LOW (ref 4.0–10.5)
nRBC: 0 % (ref 0.0–0.2)

## 2022-10-12 LAB — PLATELET FUNCTION ASSAY
Collagen / ADP: 199 seconds — ABNORMAL HIGH (ref 0–118)
Collagen / Epinephrine: 268 seconds — ABNORMAL HIGH (ref 0–193)

## 2022-10-12 LAB — RAPID URINE DRUG SCREEN, HOSP PERFORMED
Amphetamines: NOT DETECTED
Barbiturates: NOT DETECTED
Benzodiazepines: NOT DETECTED
Cocaine: NOT DETECTED
Opiates: NOT DETECTED
Tetrahydrocannabinol: NOT DETECTED

## 2022-10-12 LAB — DIC (DISSEMINATED INTRAVASCULAR COAGULATION)PANEL
D-Dimer, Quant: 0.43 ug/mL-FEU (ref 0.00–0.50)
Fibrinogen: 555 mg/dL — ABNORMAL HIGH (ref 210–475)
INR: 1 (ref 0.8–1.2)
Platelets: 59 10*3/uL — ABNORMAL LOW (ref 150–400)
Prothrombin Time: 13.6 seconds (ref 11.4–15.2)
Smear Review: NONE SEEN
aPTT: 30 seconds (ref 24–36)

## 2022-10-12 LAB — CK: Total CK: 447 U/L — ABNORMAL HIGH (ref 49–397)

## 2022-10-12 LAB — CBG MONITORING, ED
Glucose-Capillary: 127 mg/dL — ABNORMAL HIGH (ref 70–99)
Glucose-Capillary: 47 mg/dL — ABNORMAL LOW (ref 70–99)
Glucose-Capillary: 65 mg/dL — ABNORMAL LOW (ref 70–99)
Glucose-Capillary: 74 mg/dL (ref 70–99)

## 2022-10-12 LAB — IMMATURE PLATELET FRACTION: Immature Platelet Fraction: 12.3 % — ABNORMAL HIGH (ref 1.2–8.6)

## 2022-10-12 LAB — FOLATE: Folate: 6.5 ng/mL (ref >5.9)

## 2022-10-12 LAB — TECHNOLOGIST SMEAR REVIEW: Plt Morphology: DECREASED

## 2022-10-12 LAB — HIV ANTIBODY (ROUTINE TESTING W REFLEX): HIV Screen 4th Generation wRfx: NONREACTIVE

## 2022-10-12 LAB — VITAMIN B12: Vitamin B-12: 2197 pg/mL — ABNORMAL HIGH (ref 180–914)

## 2022-10-12 LAB — VALPROIC ACID LEVEL: Valproic Acid Lvl: 111 ug/mL — ABNORMAL HIGH (ref 50.0–100.0)

## 2022-10-12 MED ORDER — LACOSAMIDE 50 MG PO TABS
300.0000 mg | ORAL_TABLET | Freq: Two times a day (BID) | ORAL | Status: DC
  Administered 2022-10-12 – 2022-10-18 (×12): 300 mg via ORAL
  Filled 2022-10-12 (×12): qty 6

## 2022-10-12 MED ORDER — FOLIC ACID 1 MG PO TABS
1.0000 mg | ORAL_TABLET | Freq: Every day | ORAL | Status: DC
  Administered 2022-10-13 – 2022-10-18 (×6): 1 mg via ORAL
  Filled 2022-10-12 (×6): qty 1

## 2022-10-12 MED ORDER — ACETAMINOPHEN 650 MG RE SUPP: 650.0000 mg | Freq: Four times a day (QID) | RECTAL | Status: DC | PRN

## 2022-10-12 MED ORDER — LACTATED RINGERS IV BOLUS
500.0000 mL | Freq: Once | INTRAVENOUS | Status: AC
  Administered 2022-10-12: 500 mL via INTRAVENOUS

## 2022-10-12 MED ORDER — LACOSAMIDE 150 MG PO TABS: 2.0000 | ORAL_TABLET | Freq: Two times a day (BID) | ORAL | Status: DC

## 2022-10-12 MED ORDER — ENOXAPARIN SODIUM 40 MG/0.4ML IJ SOSY
40.0000 mg | PREFILLED_SYRINGE | INTRAMUSCULAR | Status: DC
  Administered 2022-10-12 – 2022-10-18 (×7): 40 mg via SUBCUTANEOUS
  Filled 2022-10-12 (×8): qty 0.4

## 2022-10-12 MED ORDER — THIAMINE HCL 100 MG/ML IJ SOLN: 100.0000 mg | Freq: Every day | INTRAMUSCULAR | Status: DC

## 2022-10-12 MED ORDER — LEVETIRACETAM 500 MG PO TABS: 2000.0000 mg | ORAL_TABLET | Freq: Two times a day (BID) | ORAL | Status: DC

## 2022-10-12 MED ORDER — THIAMINE MONONITRATE 100 MG PO TABS
100.0000 mg | ORAL_TABLET | Freq: Every day | ORAL | Status: DC
  Administered 2022-10-12 – 2022-10-18 (×7): 100 mg via ORAL
  Filled 2022-10-12 (×7): qty 1

## 2022-10-12 MED ORDER — ATORVASTATIN CALCIUM 10 MG PO TABS
20.0000 mg | ORAL_TABLET | Freq: Every day | ORAL | Status: DC
  Administered 2022-10-12 – 2022-10-18 (×7): 20 mg via ORAL
  Filled 2022-10-12 (×7): qty 2

## 2022-10-12 MED ORDER — DILTIAZEM HCL ER COATED BEADS 240 MG PO CP24
240.0000 mg | ORAL_CAPSULE | Freq: Every day | ORAL | Status: DC
  Administered 2022-10-12 – 2022-10-18 (×7): 240 mg via ORAL
  Filled 2022-10-12 (×8): qty 1

## 2022-10-12 MED ORDER — LEVETIRACETAM 500 MG PO TABS
2000.0000 mg | ORAL_TABLET | Freq: Two times a day (BID) | ORAL | Status: DC
  Administered 2022-10-12 – 2022-10-18 (×12): 2000 mg via ORAL
  Filled 2022-10-12 (×12): qty 4

## 2022-10-12 MED ORDER — ACETAMINOPHEN 325 MG PO TABS
650.0000 mg | ORAL_TABLET | Freq: Four times a day (QID) | ORAL | Status: DC | PRN
  Administered 2022-10-12: 650 mg via ORAL
  Filled 2022-10-12: qty 2

## 2022-10-12 MED ORDER — ADULT MULTIVITAMIN W/MINERALS CH
1.0000 | ORAL_TABLET | Freq: Every day | ORAL | Status: DC
  Administered 2022-10-12 – 2022-10-18 (×7): 1 via ORAL
  Filled 2022-10-12 (×7): qty 1

## 2022-10-12 NOTE — ED Notes (Signed)
Pt provided apple juice and graham crackers as well as breakfast tray has arrived.  Pt is eating.

## 2022-10-12 NOTE — H&P (Addendum)
Date: 10/12/2022               Patient Name:  Seth Brock MRN: [id number]  DOB: 1961/09/04 Age / Sex: 61 y.o., male   PCP: Denny Levy         Medical Service: Internal Medicine Teaching Service         Attending Physician: Dr. Dickie La, MD    First Contact: Dr. Gaylyn Cheers Brayan Votaw Pager: [phone]  Second Contact: Dr. Doran Stabler Pager: [phone]       After Hours (After 5p/  First Contact Pager: [phone]  weekends / holidays): Second Contact Pager: [phone]   Chief Complaint: AMS   History of Present Illness:   Seth Brock is a 61 y/o male with history of intellectual disability, seizure disorder, HTN, CAD, and HLD that presents for altered mental status.  Patient lives in a boardinghouse where he was noted to have increased confusion and more frequent seizures over the last few days according to his landlord Annita Brod [phone]).  Patient reports having 1-2 seizures per day over the last few days which is abnormal for him.  He states that the seizures last approximately 1 minute.  Patient states that he has fell several times due to his seizures but denies sustaining injuries at this time. He denies having pain at this time.  Patient denies urinary or bowel incontinence with the seizures. Patient endorses intermittent dizziness over the last few days but is unable to characterize the frequency or transforming factors.  The patient was accompanied by a friend who also lives in his boardinghouse.  The friend reports that the patient has been appearing more drowsy, had difficulty walking, and appears more shaky than normal. No other family or friends present during our discussion.   Patient states that his uncle and aunt live with him and that they administer his medication.  He feels as though he has been getting his medication regularly.  He reports eating and drinking well at home. Denies having problems using the bathroom.  Patient denies fever, chills, cough,  body aches, nausea, vomiting, abdominal pain, diarrhea, constipation, hematochezia, melena, headache, syncope, urinary frequency, or dysuria. He denies recent sick contacts.  Patient denies history of alcohol withdrawal.   ED Course:  -UA significant for elevated ketones -CMP significant for mild hyponatremia at 134, hypoglycemia at 58, and elevated AST at 58 -CBC significant for leukocytopenia at 3.8 and thrombocytopenia at 72 -RVP positive for influenza A -Pending levetiracetam level -Valproic acid level elevated at 111 -CXR negative for acute abnormality -CT head w/o contrast negative for acute abnormality -EKG demonstrates atrial flutter -Keppra 1000 mg administered   Meds:  No outpatient medications have been marked as taking for the 10/11/22 encounter [facility] Encounter).     Allergies: Allergies as of 10/11/2022   (No Known Allergies)   Past Medical History:  Diagnosis Date   Constipation    takes Miralax daily   Coronary artery disease    Epilepsy seizure, nonconvulsive, generalized (HCC)    FHx: atrial fibrillation    Hyperlipidemia    Hypertension    Irregular heart beat    Neurological impairment in adult    Seizures (HCC)    Family History: none reported.   Social History: Patient lives at home with his aunt and uncle.  Patient is able to dress himself and wash himself independently.  His aunt and uncle provide him with food and administer his medications.  He is able to ambulate  at baseline without assistance.  Patient reports smoking approximately 5 cigarettes every other day.  He reports drinking a couple beers per day.  He denies recreational drug use.  Review of Systems: A complete ROS was negative except as per HPI.   Physical Exam: Blood pressure (!) 133/93, pulse (!) 108, temperature 98.7 F (37.1 C), temperature source Oral, resp. rate 19, height 6\' 2"  (1.88 m), weight 85.3 kg, SpO2 99 %. Constitutional: eyes closed during majority of interaction,  responding verbally to questioning, interactive HENT: Normocephalic and atraumatic. No trauma of the tongue or oropharyngeal cavity.  Eyes: EOMI. PERRL.  Neck: Normal range of motion.  Cardiovascular: Tachycardic, regular rhythm. No murmurs, rubs, or gallops. Normal radial and DP pulses bilaterally. No LE edema.  Pulmonary: Normal respiratory effort. No wheezes, rales, or rhonchi.   Abdominal: Soft. Non-distended. No tenderness. Normal bowel sounds.  Musculoskeletal: Normal range of motion.     Neurological: Alert and oriented to person and location. Not oriented to time or situation. No sensory loss. 5/5 strength in all extremities. Normal finger-to-nose bilaterally. Action tremor of bilateral upper extremities. Answers some questions appropriately but does repeat some phrases inappropriately.  Skin: warm and dry. Diffuse dry cracked skin of bilateral upper and lower extremities. Healed abrasions on the left dorsal hand without bleeding or drainage. Overgrown toenails on all toes. No ulcers of the feet noted.   EKG: personally reviewed my interpretation is atrial flutter  CXR: personally reviewed my interpretation is negative for acute abnormality  Assessment & Plan by Problem: Principal Problem:   Seizure (HCC) Active Problems:   Leukocytopenia   Benign essential hypertension   Atrial fibrillation and flutter (HCC)   Thrombocytopenia, unspecified (HCC)   Hyperlipidemia   Intellectual disability   Influenza A   Hypoglycemia  Seth Brock is a 61 y/o male with history of intellectual disability, seizure disorder, HTN, CAD, and HLD that presents for acute altered mental status and admitted for further evaluation of his mental status and seizures.   #Seizure #AMS #Intellectual Disability #Influenza A Patient presents w/ increased confusion and increasingly frequent seizures at home. Labs on admission significant for hypoglycemia at 58 and elevated valproic acid level. UA relatively  unremarkable. CXR and CT head negative for acute abnormalities. RVP positive for influenza A. Patient not complaining of flu-like symptoms at this time, however could be contributing to his presentation. Will not give tamiflu at this time. Suspect cause of AMS and increased frequency of seizures is multifactorial given influenza A, hypoglycemia, and uncertainty of medication adherence at home. Will check CK given recent increase in the frequency of his seizures. Patient follows up with neurology and last saw them in 04/2022. Patient is supposed to be taking Depakote 1000 mg BID, Keppra 2000 mg BID, and lacosamide 300 mg BID. History of issues with medication compliance noted in his chart. Patient also has has a vagal nerve stimulator. Keppra 1000 mg was administered in the ED. Will hold Depakote at this time given elevated level on admission. Attempted to call landlord for additional information without answer. Will try to call back again later today.  -Neurology consult -Tylenol 650 mg q6 PRN for pain -Lacosamide 300 mg BID -Keppra 2000 mg BID -Pending Lacosamide level and levetiracetam level -Rapid urine drug screen -Pending HIV antibody -Pending CK  #Hypercalcemia #Hypoglycemia - resolved  Glucose of 58 on admission. Hypoglycemia potentially contributing to patients increased seizures and AMS. Hypoglycemia resolved after patient was given a snack in the ED. Calcium of 10.5  on admission. Suspect hypercalcemia is likely secondary to dehydration. Will give LR bolus 500 mL.  -LR bolus -Trend CBG -Trend CMP  #Atrial flutter #HTN Patient on diltiazem 240 mg daily at home. He follows up with cardiology and is not on anticoagulation given low CHADS2VASC score. Last saw cardiology in 04/2022. EKG on admission demonstrates atrial flutter.  -Diltiazem 240 mg  -IV Lovenox  #HLD Patient taking atorvastatin 20 mg daily at home. Lipid panel from 1 year ago demonstrates elevated LDL of 124 and decreased HDL  of 23.  -Atorvastatin 20 mg  #Acute on Chronic Leukocytopenia #Acute on Chronic Thrombocytopenia Leukocytopenia and thrombocytopenia dating back to 2012. WBC 3.8 on admission. WBC trending upward over the last 8 months. Platelets 72 on admission and decreasing over the past year. Acute decrease in platelets could be secondary to acute illness in the setting of influenza vs ITP.  -Pending Folate, Vitamin B12, Immature platelet function, peripheral blood smear -Trend CBC  Diet: Regular Bowel: none VTE: Lovenox IVF: LR Code: FULL   Prior to Admission Living Arrangement: home with uncle and aunt Anticipated Discharge Location: TBD Barriers to Discharge: continued management  Dispo: Admit patient to Observation with expected length of stay less than 2 midnights.  Signed: Karoline Caldwell, MD 10/12/2022, 4:36 AM  Pager: [phone] After 5pm on weekdays and 1pm on weekends: On Call pager: [phone]

## 2022-10-12 NOTE — Hospital Course (Addendum)
Seizure Influenza A, improving AMS, resolved Patient presented with AMS with collateral informants reporting multiple seizures the week prior to admission. Collateral reported patient intermittently drinks alcohol at group home. He was found to be hypoglycemic, with elevated Depakote levels, and mildly elevated CK. U/A with ketones. Influenza A positive. CXR and CT normal. Neurology evaluated patient and thought seizures were in the setting of viral illness. His dose of depakote was reduced given elevated levels, and kept his home Lacosamide and Keppra dosing. He also received a 2-day course of Klonopin to bridge seizure medication management as viral illness could have lowered his seizure threshold. Patient was also started on Osetalmivir to be completed on 12/28. He has not had new seizure episodes during this admission and continues to improve with URI and cough symtoms.  Acute on Chronic Leukocytopenia, improving Acute on Chronic Thrombocytopenia, improving History of chronic thrombocytopenia, documented in 2013. Platelet count during this hospitalization 57->56->78->103 today. Peripheral smear with normal morphology, thrombocytopenia, and increased left shift. Immature platelet function 12.5. Normal d-dimer and mildly elevated fibrinogen. Normal PT/INR/PTT. Folate is normal, and B12 is markedly elevated. Platelet function assay elevated; discussed with Hematology during this hospitalization. Chronic valproic and rate control medication likely contributing to chronic thrombocytopenia; however, acute decrease likely in the setting of viral illness. Patient's platelets trending up after starting Osetalmivir. He has remained asymptomatic and without evidence of bleeding. Outpatient follow up with Dr. Candise Che or Dr. Leonides Schanz recommended after hospital discharge  Atrial flutter  Rate controlled on diltiazem   Intellectual Disability Patient is oriented to self, month, place, and situation at baseline. Lives  in group home managed by Ms. Jinny Sanders, who provides transportation to hospital and clinic visits, and obtains medications for him. Medication schedule is not monitored by her. Patient has sister, but she has never been involved in care.   PT/OT - short term rehab at SNF - has been available, pending insurance authorization

## 2022-10-12 NOTE — Progress Notes (Addendum)
Hospital day#0 Subjective:   Summary: Seth Brock is a 61 y/o male with history of intellectual disability, seizure disorder, HTN, CAD, and HLD that presents for acute altered mental status and admitted for further evaluation of his mental status and seizures    Overnight Events: no acute overnight events   Unsure why he came into the hospital, but states friend, Seth Brock/Seth Brock, had to work and brought him here. Alert and oriented to self, place, not time, States the date is his birthdate 2061/07/14. Having no pain, dizziness, no CP, SOB, abd pain, diarrhea, urinary sxs.  Seth Brock helps w his meds Is eating less than usual - forgets to eat? Does not feel hungry but did this AM and ate breakfast Skin changes: scratched skin after seizure/falls  Collateral information with landlady of group home, Ms. Seth Brock - [phone]: she repported patient lives in her group home with two other roommates. She and her two other friends assist the tenants with rides to hospital visits, pharmacy, and grocery store. Patient's baseline is oriented to self and to place but he is often confused and does not remember date or medical conditions. Otherwise, he is independent with ADLs, cooks at home, and walks/access outdoors without supervision. He is encouraged to take medications, but there is not true supervision or administration of medications at the house. Ms. Seth Brock reported pt has been buying alcohol and bringing to the home, last time he was noted drinking was ~week ago and threatened to be dismissed from group home. He has been with low po intake for the past week but he is usually a good eater. No URI symptoms or other abnormalities noted other than the drowsiness, altered mentation, and seizures.  Objective:  Vital signs in last 24 hours: Vitals:   10/12/22 0500 10/12/22 0530 10/12/22 0600 10/12/22 0623  BP: (!) 159/104 (!) 135/93 127/89   Pulse: 93 90 90   Resp: 18 18 19    Temp:    98.4 F  (36.9 C)  TempSrc:    Oral  SpO2: 97% 98% 99%   Weight:      Height:       Supplemental O2: Room Air SpO2: 99 % Filed Weights   10/11/22 1802  Weight: 85.3 kg    Physical Exam:  Constitutional:chronically ill-appearing man sitting in bed, in no acute distress HENT: normocephalic atraumatic, moist mucous membranes Neck: supple Cardiovascular: regular rate and rhythm, no m/r/g Pulmonary/Chest: normal work of breathing on room air, lungs clear to auscultation bilaterally. No wheezing Abdominal: normal BS, soft, non-tender, non-distended.  MSK: normal bulk and tone. No pitting edema Neurological: alert & oriented to person and location not to date or situation, responding to all questions, moving all extremities Skin: warm and dry. No ecchymoses, petechia, or purpura on exam. Chronic abrasion on medial R palmar surface. Dried blood over knuckles of L hand. Psych: Pleasant mood and affect    Intake/Output Summary (Last 24 hours) at 10/12/2022 0904 Last data filed at 10/12/2022 0830 Gross per 24 hour  Intake 500 ml  Output 300 ml  Net 200 ml   Net IO Since Admission: 200 mL [10/12/22 0904]  Pertinent Labs:    Latest Ref Rng & Units 10/12/2022    5:25 AM 10/11/2022    6:32 PM 01/23/2022    6:07 PM  CBC  WBC 4.0 - 10.5 K/uL 3.7  3.8  3.4   Hemoglobin 13.0 - 17.0 g/dL 03/25/2022  78.2  95.6   Hematocrit 39.0 -  52.0 % 39.0  45.5  40.8   Platelets 150 - 400 K/uL 67  72  114        Latest Ref Rng & Units 10/12/2022    5:25 AM 10/11/2022    6:32 PM 01/23/2022    6:07 PM  CMP  Glucose 70 - 99 mg/dL 55  58  79   BUN 6 - 20 mg/dL 19  20  15    Creatinine 0.61 - 1.24 mg/dL 0.98  1.11  0.93   Sodium 135 - 145 mmol/L 134  134  139   Potassium 3.5 - 5.1 mmol/L 3.9  4.1  4.4   Chloride 98 - 111 mmol/L 98  96  107   CO2 22 - 32 mmol/L 24  25  25    Calcium 8.9 - 10.3 mg/dL 9.6  10.5  10.2   Total Protein 6.5 - 8.1 g/dL 8.1  9.6  8.3   Total Bilirubin 0.3 - 1.2 mg/dL 0.8  0.8  0.6    Alkaline Phos 38 - 126 U/L 43  48  54   AST 15 - 41 U/L 46  58  23   ALT 0 - 44 U/L 18  21  15      Imaging: CT Head Wo Contrast  Result Date: 10/12/2022 CLINICAL DATA:  Altered mental status. EXAM: CT HEAD WITHOUT CONTRAST TECHNIQUE: Contiguous axial images were obtained from the base of the skull through the vertex without intravenous contrast. RADIATION DOSE REDUCTION: This exam was performed according to the departmental dose-optimization program which includes automated exposure control, adjustment of the mA and/or kV according to patient size and/or use of iterative reconstruction technique. COMPARISON:  Head CT dated 05/26/2021. FINDINGS: Brain: The ventricles and sulci appropriate size for patient's age. Mild periventricular and deep white matter chronic microvascular ischemic changes noted. There is no acute intracranial hemorrhage. No mass effect or midline shift. No extra-axial fluid collection. Vascular: No hyperdense vessel or unexpected calcification. Skull: Normal. Negative for fracture or focal lesion. Sinuses/Orbits: Mild mucoperiosteal thickening of paranasal sinuses. No air-fluid level. The mastoid air cells are clear. Other: None IMPRESSION: 1. No acute intracranial pathology. 2. Mild chronic microvascular ischemic changes. Electronically Signed   By: Anner Crete M.D.   On: 10/12/2022 01:18   DG Chest Port 1 View  Result Date: 10/11/2022 CLINICAL DATA:  Altered mental status EXAM: PORTABLE CHEST 1 VIEW COMPARISON:  Radiographs 05/07/2017 FINDINGS: The heart size and mediastinal contours are within normal limits. Both lungs are clear. The visualized skeletal structures are unremarkable. Left chest wall neurostimulator. IMPRESSION: No active disease. Electronically Signed   By: Placido Sou M.D.   On: 10/11/2022 18:53    Assessment/Plan:   Principal Problem:   Seizure ([place]) Active Problems:   Leukocytopenia   Benign essential hypertension   Atrial fibrillation and  flutter (HCC)   Thrombocytopenia, unspecified (HCC)   Hyperlipidemia   Intellectual disability   Influenza A   Hypoglycemia   Patient Summary: Seth Brock is a 61 y/o male with history of intellectual disability, seizure disorder, HTN, CAD, and HLD that presents for acute altered mental status and admitted for further evaluation of his mental status and seizures  Seizure AMS Intellectual Disability Influenza A Patient presenting with AMS with reports of multiple seizures episodes in the past week at boarding home. Patient was found to be hypoglycemic, with elevated valproic acid levels on admission. CK elevated to 447, and +ketones present in the urine, likely in the setting of seizure  activity and starvation ketosis. Influenza A + on RVP though asymptomatic, otherwise CXR, CT head normal. After speaking with landlady and boarding house leader who oversees medications, patient does not  take medications as prescribed. Patient has also been noted to be drinking alcohol without supervision, which could lower seizure threshold. Will continue to monitor for clinical progression. Appreciate neurology recommendations.  -Neurology consulted -CIWA without Ativan -Tylenol 650 mg q6 PRN for pain -Lacosamide 300 mg BID -Keppra 2000 mg BID -Holding Valproic acid in the setting of elevated valproic acid -Pending Lacosamide level and levetiracetam level -Rapid urine drug screen: negative -Pending HIV antibody -Pending CK  Acute on Chronic Leukocytopenia Acute on Chronic Thrombocytopenia Currently asymptomatic, no bleeding, or with petechiae or purpura on exam; however, patient presented with AMS thought to be multifactorial at this time. Leukopenia documented since 2013. Chronically intermittently thrombocytopenic, though decreased this admission. Peripheral smear with normal morphology, thrombocytopenia, and increased left shift. Immature platelet function 12.5. Folate is normal, and B12 is markedly  elevated. Likely in the setting of recent viral illness, EtOH use, diltiazem (added on 04/2022), or chronic Valproic acid. Will add on coagulation studies.  -Trend CBC -f/u coagulation studies -HIV pending -Monitor for signs of bleeding  Atrial flutter Mildly hypertensive. Current rates mid 90s-100. Not on BP meds at home. Diltiazem restarted on admission, however not given yet. Unclear whether patient has been compliant with medications.  -Start Diltiazem 240 mg daily -Continue on Tele  Hypercalcemia Likely in the setting of dehydration -Continue to monitor   Diet: Normal VTE: Enoxaparin Code: Full PT/OT recs: pending Family Update:   Dispo: Anticipated discharge to  pending PT evaluation  in 2 days pending clinical improvement.   Romana Juniper, MD Internal Medicine Resident PGY-1 Please contact the on call pager after 5 pm and on weekends at [phone].

## 2022-10-13 DIAGNOSIS — K5900 Constipation, unspecified: Secondary | ICD-10-CM | POA: Diagnosis not present

## 2022-10-13 DIAGNOSIS — J101 Influenza due to other identified influenza virus with other respiratory manifestations: Secondary | ICD-10-CM

## 2022-10-13 DIAGNOSIS — E162 Hypoglycemia, unspecified: Secondary | ICD-10-CM | POA: Diagnosis not present

## 2022-10-13 DIAGNOSIS — I4892 Unspecified atrial flutter: Secondary | ICD-10-CM | POA: Diagnosis not present

## 2022-10-13 DIAGNOSIS — I4891 Unspecified atrial fibrillation: Secondary | ICD-10-CM | POA: Diagnosis not present

## 2022-10-13 DIAGNOSIS — F17210 Nicotine dependence, cigarettes, uncomplicated: Secondary | ICD-10-CM | POA: Diagnosis not present

## 2022-10-13 DIAGNOSIS — R569 Unspecified convulsions: Secondary | ICD-10-CM | POA: Diagnosis not present

## 2022-10-13 DIAGNOSIS — E871 Hypo-osmolality and hyponatremia: Secondary | ICD-10-CM | POA: Diagnosis not present

## 2022-10-13 DIAGNOSIS — Z1152 Encounter for screening for COVID-19: Secondary | ICD-10-CM | POA: Diagnosis not present

## 2022-10-13 DIAGNOSIS — D6959 Other secondary thrombocytopenia: Secondary | ICD-10-CM | POA: Diagnosis not present

## 2022-10-13 DIAGNOSIS — E860 Dehydration: Secondary | ICD-10-CM | POA: Diagnosis not present

## 2022-10-13 DIAGNOSIS — R001 Bradycardia, unspecified: Secondary | ICD-10-CM | POA: Diagnosis not present

## 2022-10-13 DIAGNOSIS — I4430 Unspecified atrioventricular block: Secondary | ICD-10-CM | POA: Diagnosis not present

## 2022-10-13 DIAGNOSIS — F79 Unspecified intellectual disabilities: Secondary | ICD-10-CM | POA: Diagnosis not present

## 2022-10-13 DIAGNOSIS — E785 Hyperlipidemia, unspecified: Secondary | ICD-10-CM | POA: Diagnosis not present

## 2022-10-13 DIAGNOSIS — Z79899 Other long term (current) drug therapy: Secondary | ICD-10-CM | POA: Diagnosis not present

## 2022-10-13 DIAGNOSIS — E8889 Other specified metabolic disorders: Secondary | ICD-10-CM | POA: Diagnosis not present

## 2022-10-13 DIAGNOSIS — Z9181 History of falling: Secondary | ICD-10-CM | POA: Diagnosis not present

## 2022-10-13 DIAGNOSIS — G40909 Epilepsy, unspecified, not intractable, without status epilepticus: Secondary | ICD-10-CM | POA: Diagnosis not present

## 2022-10-13 DIAGNOSIS — R4182 Altered mental status, unspecified: Secondary | ICD-10-CM | POA: Diagnosis not present

## 2022-10-13 DIAGNOSIS — D72819 Decreased white blood cell count, unspecified: Secondary | ICD-10-CM | POA: Diagnosis not present

## 2022-10-13 DIAGNOSIS — I10 Essential (primary) hypertension: Secondary | ICD-10-CM | POA: Diagnosis not present

## 2022-10-13 DIAGNOSIS — D696 Thrombocytopenia, unspecified: Secondary | ICD-10-CM

## 2022-10-13 DIAGNOSIS — R42 Dizziness and giddiness: Secondary | ICD-10-CM | POA: Diagnosis not present

## 2022-10-13 DIAGNOSIS — I2510 Atherosclerotic heart disease of native coronary artery without angina pectoris: Secondary | ICD-10-CM | POA: Diagnosis not present

## 2022-10-13 LAB — CBC
HCT: 36.1 % — ABNORMAL LOW (ref 39.0–52.0)
Hemoglobin: 12.2 g/dL — ABNORMAL LOW (ref 13.0–17.0)
MCH: 24.8 pg — ABNORMAL LOW (ref 26.0–34.0)
MCHC: 33.8 g/dL (ref 30.0–36.0)
MCV: 73.5 fL — ABNORMAL LOW (ref 80.0–100.0)
Platelets: 57 10*3/uL — ABNORMAL LOW (ref 150–400)
RBC: 4.91 MIL/uL (ref 4.22–5.81)
RDW: 15.4 % (ref 11.5–15.5)
WBC: 3.3 10*3/uL — ABNORMAL LOW (ref 4.0–10.5)
nRBC: 0 % (ref 0.0–0.2)

## 2022-10-13 LAB — GLUCOSE, CAPILLARY
Glucose-Capillary: 105 mg/dL — ABNORMAL HIGH (ref 70–99)
Glucose-Capillary: 72 mg/dL (ref 70–99)

## 2022-10-13 LAB — TSH: TSH: 2.206 u[IU]/mL (ref 0.350–4.500)

## 2022-10-13 LAB — BASIC METABOLIC PANEL
Anion gap: 12 (ref 5–15)
BUN: 12 mg/dL (ref 6–20)
CO2: 26 mmol/L (ref 22–32)
Calcium: 9.9 mg/dL (ref 8.9–10.3)
Chloride: 96 mmol/L — ABNORMAL LOW (ref 98–111)
Creatinine, Ser: 1 mg/dL (ref 0.61–1.24)
GFR, Estimated: >60 mL/min (ref >60)
Glucose, Bld: 89 mg/dL (ref 70–99)
Potassium: 3.7 mmol/L (ref 3.5–5.1)
Sodium: 134 mmol/L — ABNORMAL LOW (ref 135–145)

## 2022-10-13 LAB — VALPROIC ACID LEVEL: Valproic Acid Lvl: 69 ug/mL (ref 50.0–100.0)

## 2022-10-13 LAB — AMMONIA: Ammonia: 34 umol/L (ref 9–35)

## 2022-10-13 MED ORDER — OSELTAMIVIR PHOSPHATE 75 MG PO CAPS
75.0000 mg | ORAL_CAPSULE | Freq: Two times a day (BID) | ORAL | Status: AC
  Administered 2022-10-13 – 2022-10-17 (×10): 75 mg via ORAL
  Filled 2022-10-13 (×10): qty 1

## 2022-10-13 MED ORDER — CLONAZEPAM 0.5 MG PO TABS
0.5000 mg | ORAL_TABLET | Freq: Two times a day (BID) | ORAL | Status: AC
  Administered 2022-10-13 – 2022-10-14 (×4): 0.5 mg via ORAL
  Filled 2022-10-13 (×4): qty 1

## 2022-10-13 MED ORDER — DIVALPROEX SODIUM 500 MG PO DR TAB
750.0000 mg | DELAYED_RELEASE_TABLET | Freq: Two times a day (BID) | ORAL | Status: DC
  Administered 2022-10-13 – 2022-10-18 (×12): 750 mg via ORAL
  Filled 2022-10-13 (×13): qty 1

## 2022-10-13 NOTE — Evaluation (Signed)
Occupational Therapy Evaluation Patient Details Name: Seth Brock MRN: [id number] DOB: [birth year] Today's Date: 10/13/2022   History of Present Illness Patient is a 61 y/o male who presents on 12/22 with confusion, unsteady gait and seizures. Found to have recurrent breakthrough seizures and + Flu A. PMH includes intellectual disability, CAD, Epilepsy seizures, A-fib, HTN.   Clinical Impression   Per pt, he lives in a group home and is independent but someone brings his medications. Inconsistencies noted in chart and history provided by pt. Pt with max confusion during session, reporting he will take care of everything on arrival, reporting it is year 44, and that he is in [place]. Pt following basic commands with increased time and up to mod multimodal cues. Pt with decreased safety awareness reporting his balance is fine despite requiring min A to avoid posterior balance loss in sitting and standing. Pt with mod A to don socks for balance and threading. Pt will continue to benefit from skilled OT services acutely and recommend follow up at venue listed below.      Recommendations for follow up therapy are one component of a multi-disciplinary discharge planning process, led by the attending physician.  Recommendations may be updated based on patient status, additional functional criteria and insurance authorization.   Follow Up Recommendations  Skilled nursing-short term rehab (<3 hours/day)     Assistance Recommended at Discharge Frequent or constant Supervision/Assistance  Patient can return home with the following A little help with walking and/or transfers;A lot of help with bathing/dressing/bathroom;Assistance with cooking/housework;Direct supervision/assist for medications management;Direct supervision/assist for financial management;Assist for transportation;Help with stairs or ramp for entrance    Functional Status Assessment  Patient has had a recent decline in their functional  status and demonstrates the ability to make significant improvements in function in a reasonable and predictable amount of time.  Equipment Recommendations  Other (comment) (defer to next venue)    Recommendations for Other Services       Precautions / Restrictions Precautions Precautions: Fall;Other (comment) Precaution Comments: watch 02 Restrictions Weight Bearing Restrictions: No      Mobility Bed Mobility Overal bed mobility: Needs Assistance Bed Mobility: Supine to Sit     Supine to sit: Min assist     General bed mobility comments: max cues for problem solving, min A for elevating trunk    Transfers Overall transfer level: Needs assistance Equipment used: Rolling walker (2 wheels), None Transfers: Sit to/from Stand Sit to Stand: Min assist           General transfer comment: Min A to power to standing from EOB x3, flexed trunk. posterior bias in standing and no awareness      Balance Overall balance assessment: Needs assistance Sitting-balance support: Feet supported, No upper extremity supported Sitting balance-Leahy Scale: Poor Sitting balance - Comments: Posterior LOB in sitting during LB ADL and while standing requiring min-mod A to correct Postural control: Posterior lean Standing balance support: During functional activity, Reliant on assistive device for balance, Bilateral upper extremity supported Standing balance-Leahy Scale: Poor Standing balance comment: Requires UE support in standing and external support for dynamic tasks.                           ADL either performed or assessed with clinical judgement   ADL Overall ADL's : Needs assistance/impaired Eating/Feeding: Set up;Sitting   Grooming: Supervision/safety;Sitting   Upper Body Bathing: Sitting;Min guard   Lower Body Bathing: Sitting/lateral leans;Minimal assistance  Upper Body Dressing : Min guard;Sitting   Lower Body Dressing: Maximal assistance   Toilet  Transfer: Minimal assistance;Rolling walker (2 wheels);Stand-pivot           Functional mobility during ADLs: Minimal assistance;Rolling walker (2 wheels) (SPT at this time)       Vision Patient Visual Report: Other (comment);No change from baseline (Pt poor historian and unable to report) Additional Comments: Pt denies changes     Perception     Praxis      Pertinent Vitals/Pain       Hand Dominance Right   Extremity/Trunk Assessment Upper Extremity Assessment Upper Extremity Assessment: Generalized weakness;LUE deficits/detail (R little finger and ring finger contracted at baseline) LUE Deficits / Details: decreased foordination during finger to nose as compared to RUE   Lower Extremity Assessment Lower Extremity Assessment: Defer to PT evaluation   Cervical / Trunk Assessment Cervical / Trunk Assessment: Normal   Communication Communication Communication: No difficulties   Cognition Arousal/Alertness: Awake/alert Behavior During Therapy: WFL for tasks assessed/performed Overall Cognitive Status: Impaired/Different from baseline Area of Impairment: Orientation, Attention, Following commands, Safety/judgement, Awareness, Problem solving                 Orientation Level: Disoriented to, Time, Place (believes he is in [place] initially) Current Attention Level: Sustained   Following Commands: Follows one step commands inconsistently, Follows one step commands with increased time Safety/Judgement: Decreased awareness of safety, Decreased awareness of deficits Awareness: Intellectual Problem Solving: Slow processing, Decreased initiation, Difficulty sequencing, Requires verbal cues, Requires tactile cues General Comments: Repeats self frequently, tangential at times. No awareness of safety/deficits, asking if he can go home today despite not being able to walk. Impaired initiation and sequencing, delayed processing at times. Able to report that he knows he is  not supposed to get up alone, and to use RN call bell if he needs anything or would like to get up, however, 1.5 mins after OT leaving to locate RN to ask if pt can have snack, chair alarm going off and pt slid down in chair requiuring mod A to achieve sititng position and scoot hips back into back of chair. RN in room on departure     General Comments  Pt VSS    Exercises     Shoulder Instructions      Home Living Family/patient expects to be discharged to:: Group home Living Arrangements: Alone Available Help at Discharge: Friend(s) Type of Home: House Home Access: Level entry     Home Layout: Two level;Able to live on main level with bedroom/bathroom         Bathroom Toilet: Standard     Home Equipment: None   Additional Comments: conflicting info in chart vs by patient as well as inconsistencies. Initially pt reporting he had family (aunt/uncle) that visit him every other day and make sure he takes his meds and checks on him however later in interview pt reports he does not have family that visit him and they are dead or living in [place]. Unsure of accuracy of any info obtained today.      Prior Functioning/Environment Prior Level of Function : Independent/Modified Independent;Patient poor historian/Family not available             Mobility Comments: Independent, walks to church weekly. Group home landlord manages medications per report ADLs Comments: independent        OT Problem List: Decreased strength;Impaired balance (sitting and/or standing);Decreased activity tolerance;Impaired vision/perception;Decreased coordination;Decreased cognition;Decreased safety awareness;Decreased knowledge  of use of DME or AE      OT Treatment/Interventions: Self-care/ADL training;Therapeutic exercise;DME and/or AE instruction;Therapeutic activities;Cognitive remediation/compensation;Patient/family education;Balance training    OT Goals(Current goals can be found in the care plan  section) Acute Rehab OT Goals Patient Stated Goal: go home OT Goal Formulation: With patient Time For Goal Achievement: 10/27/22 Potential to Achieve Goals: Fair  OT Frequency: Min 2X/week    Co-evaluation              AM-PAC OT "6 Clicks" Daily Activity     Outcome Measure Help from another person eating meals?: A Little Help from another person taking care of personal grooming?: A Little Help from another person toileting, which includes using toliet, bedpan, or urinal?: A Lot Help from another person bathing (including washing, rinsing, drying)?: A Lot Help from another person to put on and taking off regular upper body clothing?: A Little Help from another person to put on and taking off regular lower body clothing?: A Lot 6 Click Score: 15   End of Session Equipment Utilized During Treatment: Gait belt;Rolling walker (2 wheels) Nurse Communication: Mobility status;Other (comment) (RN to assist with transfer back to bed soon due to decreased safety and cognition)  Activity Tolerance: Patient tolerated treatment well Patient left: in chair;with call bell/phone within reach;with nursing/sitter in room  OT Visit Diagnosis: Unsteadiness on feet (R26.81);Other abnormalities of gait and mobility (R26.89);Muscle weakness (generalized) (M62.81);Other symptoms and signs involving cognitive function                Time: 1145-1220 OT Time Calculation (min): 35 min Charges:  OT General Charges $OT Visit: 1 Visit OT Evaluation $OT Eval Moderate Complexity: 1 Mod OT Treatments $Self Care/Home Management : 8-22 mins  Tyler Deis, OTR/L [facility] Acute Rehabilitation Office: [phone]    Myrla Halsted 10/13/2022, 12:37 PM

## 2022-10-13 NOTE — Consult Note (Addendum)
Neurology Consultation Reason for Consult: Seizures Referring Physician: Donnie Aho  CC: Seizure  History is obtained from:patient, chart  HPI: Seth Brock is a 61 y.o. male with a history of epilepsy, hypertension, hyperlipidemia who presents with recurrent breakthrough seizures.  The presumption is that he has been compliant, despite this he has had a seizure every day for 7 days.  He is flu positive.  When I ask him about it, he is very dismissive, telling me that I am playing some sort of game with him.  I am not sure if he is joking with me or if he is slightly paranoid, but history is limited due to this.   Past Medical History:  Diagnosis Date   Constipation    takes Miralax daily   Coronary artery disease    Epilepsy seizure, nonconvulsive, generalized (HCC)    FHx: atrial fibrillation    Hyperlipidemia    Hypertension    Irregular heart beat    Neurological impairment in adult    Seizures (HCC)      History reviewed. No pertinent family history.   Social History:  reports that he has been smoking cigarettes. He has been exposed to tobacco smoke. He has never used smokeless tobacco. He reports that he does not currently use alcohol. He reports that he does not use drugs.   Exam: Current vital signs: BP 105/70 (BP Location: Left Arm)   Pulse 94   Temp 99 F (37.2 C) (Oral)   Resp 18   Ht 6\' 2"  (1.88 m)   Wt 85.3 kg   SpO2 96%   BMI 24.14 kg/m  Vital signs in last 24 hours: Temp:  [98.2 F (36.8 C)-100.2 F (37.9 C)] 99 F (37.2 C) (12/23 2329) Pulse Rate:  [89-114] 94 (12/23 2329) Resp:  [16-20] 18 (12/23 2329) BP: (105-159)/(70-104) 105/70 (12/23 2329) SpO2:  [92 %-99 %] 96 % (12/23 2329)   Physical Exam  Appears well-developed and well-nourished.   Neuro: Mental Status: Patient is awake, alert, oriented to person, place, month, year, and situation. Patient is able to give a clear and coherent history. No signs of aphasia or neglect Cranial  Nerves: II: Visual Fields are full. Pupils are equal, round, and reactive to light.   III,IV, VI: EOMI without ptosis or diploplia.  V: Facial sensation is symmetric to temperature VII: Facial movement is symmetric.  VIII: hearing is intact to voice X: Uvula elevates symmetrically XI: Shoulder shrug is symmetric. XII: tongue is midline without atrophy or fasciculations.  Motor: Tone is normal. Bulk is normal. 5/5 strength was present in all four extremities.  Sensory: Sensation is symmetric to light touch and temperature in the arms and legs. Cerebellar: FNF intact bilaterally   I have reviewed labs in epic and the results pertinent to this consultation are: VPA 111 on admission   I have reviewed the images obtained: CT head-no acute changes  Impression: 61 year old male who presented with a history of epilepsy who presents with recurrent seizures in the setting of flu.  Certainly flu can lower seizure threshold, and he should be getting through the period where his threshold will be considerably lowered.  I do think it would be prudent to temporize him with some additional agent for the time being, but I would not favor long-term changing regimen that seems to be working well.  He was supratherapeutic on Depakote and therefore I will reduce this to 750 twice daily, but will continue his other medications as previously prescribed.  His high Depakote level on admission could have certainly contributed to some altered mental status.  His level was only a little bit higher than typical and therefore I will go ahead and restart it at the lower dose.   Depakote can certainly contribute to thrombocytopenia, but given that this is a longstanding agent and he is in the setting of an acute illness, I think would favor repeating labs in a couple of weeks rather than taking him off the Depakote now.  If he develops symptomatic bleeding, or his platelets continue to decline, this could be  reconsidered.  Recommendations: 1) decrease Depakote to 750 twice daily (home dose 1 g twice daily) 2) continue Keppra 2 g twice daily 3) continue Vimpat 300 mg twice daily 4) add Klonopin 0.5 mg twice daily for 2 days 5) repeat Depakote level   Ritta Slot, MD [facility] [phone]  If 7pm- 7am, please page neurology on call as listed in [facility]ION.

## 2022-10-13 NOTE — Progress Notes (Addendum)
Hospital day#0 Subjective:   Summary: Seth Brock is a 61 y/o male with history of intellectual disability, seizure disorder, HTN, CAD, and HLD that presents for acute altered mental status and admitted for further evaluation of his mental status and seizures    Overnight Events: no acute overnight events; neurology evaluated at bedside  Endorsing URI and mild cough this morning, but remains with good appetite. Denies n/v, abdominal pain or diarrhea. Last BM last Friday. Denies bleeding per mouth, urine, or rectum. Denies recent history of bleeding, bruising, or inability to control bleeding form minor scrapes. Was able to talk more about the seizure episodes today and reported he was glad to be in the hospital. Discussed drinking alcohol and encouraged alcohol cessation.  Objective:  Vital signs in last 24 hours: Vitals:   10/12/22 1949 10/12/22 2329 10/13/22 0339 10/13/22 0800  BP: 112/76 105/70 116/78   Pulse: (!) 103 94 82   Resp: 20 18 20    Temp: 99.1 F (37.3 C) 99 F (37.2 C) 98.8 F (37.1 C) 98.5 F (36.9 C)  TempSrc: Oral Oral Oral Oral  SpO2: 97% 96% 100%   Weight:      Height:       Supplemental O2: Room Air SpO2: 100 % Filed Weights   10/11/22 1802  Weight: 85.3 kg    Physical Exam:  Constitutional:chronically ill-appearing man sitting in bed, in no acute distress HENT: normocephalic atraumatic, moist mucous membranes Neck: supple Cardiovascular: regular rate and rhythm, no m/r/g Pulmonary/Chest: normal work of breathing on room air, lungs clear to auscultation bilaterally. No wheezing Abdominal: normal BS, soft, non-tender, non-distended.  MSK: normal bulk and tone. Moving in bed. No pitting edema Neurological: alert & oriented to person, date, location, and situation today, responding to all questions, moving all extremities Skin: warm and dry. No ecchymoses, petechia, or purpura on thorough skin exam. Chronic abrasion on medial R palmar surface. Psych:  Pleasant mood and affect    Intake/Output Summary (Last 24 hours) at 10/13/2022 1319 Last data filed at 10/13/2022 0300 Gross per 24 hour  Intake --  Output 200 ml  Net -200 ml   Net IO Since Admission: 0 mL [10/13/22 1319]  Pertinent Labs:    Latest Ref Rng & Units 10/13/2022    4:15 AM 10/12/2022    5:48 PM 10/12/2022    5:25 AM  CBC  WBC 4.0 - 10.5 K/uL 3.3   3.7   Hemoglobin 13.0 - 17.0 g/dL 12.2   12.9   Hematocrit 39.0 - 52.0 % 36.1   39.0   Platelets 150 - 400 K/uL 57  59  67        Latest Ref Rng & Units 10/13/2022    4:15 AM 10/12/2022    5:25 AM 10/11/2022    6:32 PM  CMP  Glucose 70 - 99 mg/dL 89  55  58   BUN 6 - 20 mg/dL 12  19  20    Creatinine 0.61 - 1.24 mg/dL 1.00  0.98  1.11   Sodium 135 - 145 mmol/L 134  134  134   Potassium 3.5 - 5.1 mmol/L 3.7  3.9  4.1   Chloride 98 - 111 mmol/L 96  98  96   CO2 22 - 32 mmol/L 26  24  25    Calcium 8.9 - 10.3 mg/dL 9.9  9.6  10.5   Total Protein 6.5 - 8.1 g/dL  8.1  9.6   Total Bilirubin 0.3 - 1.2 mg/dL  0.8  0.8   Alkaline Phos 38 - 126 U/L  43  48   AST 15 - 41 U/L  46  58   ALT 0 - 44 U/L  18  21     Imaging: No results found.  Assessment/Plan:   Principal Problem:   Seizure (HCC) Active Problems:   Leukocytopenia   Benign essential hypertension   Atrial fibrillation and flutter (HCC)   Thrombocytopenia, unspecified (HCC)   Hyperlipidemia   Intellectual disability   Influenza A   Hypoglycemia   Patient Summary: Seth Brock is a 61 y/o male with history of intellectual disability, seizure disorder, HTN, CAD, and HLD that presents for acute altered mental status 2/2 seizures vs EtOH use vs acute viral illness now improving   AMS, resolved Seizure Intellectual Disability Influenza A Patient presenting with AMS with reports of multiple seizures episodes in the past week at boarding home. Hypoglycemic, elevated Depakote levels, CK elevated to 447,  U/A with +ketones on admission, likely in  the setting of seizure activity and starvation ketosis. CXR, CT head normal. Neurology evaluated yesterday, and recommends continuation of anti seizure medication, though decreased levels of Depakote given recent increased level. Patient is influenza A positive, previously asymptomatic, but endorsing URI sxs today, which could lower seizure threshold. Started on Klonopin for 2 days to bridge during acute illness.  Patient is Alert and Ox4 today. Given hx of recent EtOH use, patient remains on CIWA with low scores. Will continue seizure management per neurology, start Osetalmivir today, and continue on CIWA protocol. Neurology has signed off. -Neurology consulted, appreciate recs -Repeat Depakote today, within therapeutic range -CIWA without Ativan -Osetalmivir 75 mg (12/24 -Decrease Depakote to 750 twice daily (home dose 1 g twice daily) (12-24- -Continue Keppra 2 g twice daily -Continue Vimpat 300 mg twice daily -Add Klonopin 0.5 mg twice daily for 2 days (12-24-  Acute on Chronic Leukocytopenia Acute on Chronic Thrombocytopenia Continues to be asymptomatic, no bleeding, or with petechiae or purpura on exam. WBC today 3.3, PLT 57K.  Leukopenia documented since 2013. Chronically intermittently thrombocytopenic, though decreased this admission. Peripheral smear with normal morphology, thrombocytopenia, and increased left shift. Immature platelet function 12.5. Normal d-dimer and mildly elevated fibrinogen. Normal PT/INR/PTT. Folate is normal, and B12 is markedly elevated.  Platelet function assay elevated; discussed with Dr. Arbutus Ped from [facility]. After discussion, it is likely that patients' chronic thrombocytopenia could be in the setting of anti seizure and rate control medications. However, recent acute drop in the setting of viral illness vs EtOH use. Since patient is asymptomatic, not concerns for bleeding at this time, will continue to trend CBC and outpatient referral to hematology with Dr. Candise Che or  Dr. Leonides Schanz for further work up -Trend CBC -Monitor for signs of bleeding -Outpatient  Atrial flutter Rate controlled on diltiazem -Continue Diltiazem 240 mg daily -Continue on Tele  Hypercalcemia Likely in the setting of dehydration -Continue to monitor   Diet: Normal VTE: Enoxaparin Code: Full PT/OT recs: Short term SNF for rehab Family Update:   Dispo: Anticipated discharge to  pending PT evaluation  in 2 days pending clinical improvement.   Morene Crocker, MD Internal Medicine Resident PGY-1 Please contact the on call pager after 5 pm and on weekends at [phone].

## 2022-10-13 NOTE — Evaluation (Signed)
Physical Therapy Evaluation Patient Details Name: Seth Brock MRN: [id number] DOB: 25-Nov-1960 Today's Date: 10/13/2022  History of Present Illness  Patient is a 61 y/o male who presents on 12/22 with confusion, unsteady gait and seizures. Found to have recurrent breakthrough seizures and + Flu A. PMH includes intellectual disability, CAD, Epilepsy seizures, A-fib, HTN.  Clinical Impression  Patient presents with generalized weakness, cognitive deficits, impaired balance and impaired mobility s/p above. Pt is not the best historian (hx of intellectual disability) and there is conflicting into in the chart regarding PLOF/support etc. Per report, pt is from a group home and is independent for ADLs and IADLs as well as walking at baseline. He reports he walks to church weekly with no difficulties. His landlord manages his medication. Today, pt requires Max A for bed mobility, Min A for standing and mod-Max A to attempt pre gait/walking due to cognition, weakness and poor balance. Pt requires 2 person assist at this time to progress ambulation safely. Pt had just woken up from sleeping- wondering if this is impacting mobility. Unable to utilize RW for support and demonstrates posterior bias and LOB on a few occasions this session. Not safe to return home to group home at this time due to level of assist needed for all mobility. Will follow acutely to maximize independence and mobility prior to return home.     Recommendations for follow up therapy are one component of a multi-disciplinary discharge planning process, led by the attending physician.  Recommendations may be updated based on patient status, additional functional criteria and insurance authorization.  Follow Up Recommendations Skilled nursing-short term rehab (<3 hours/day) Can patient physically be transported by private vehicle: No    Assistance Recommended at Discharge Frequent or constant Supervision/Assistance  Patient can return home  with the following  Two people to help with walking and/or transfers;A lot of help with bathing/dressing/bathroom;Direct supervision/assist for medications management;Help with stairs or ramp for entrance;Assist for transportation;Direct supervision/assist for financial management;Assistance with cooking/housework    Equipment Recommendations Other (comment) (TBD)  Recommendations for Other Services       Functional Status Assessment Patient has had a recent decline in their functional status and demonstrates the ability to make significant improvements in function in a reasonable and predictable amount of time.     Precautions / Restrictions Precautions Precautions: Fall;Other (comment) Precaution Comments: watch 02 Restrictions Weight Bearing Restrictions: No      Mobility  Bed Mobility Overal bed mobility: Needs Assistance Bed Mobility: Supine to Sit, Sit to Supine     Supine to sit: Max assist Sit to supine: Mod assist   General bed mobility comments: HOB moslty flat, pt unable to problem solve how to sit up despite cues, needing max A to elevate trunk to get to EOB. Posterior bias and LOB on a few occasions. Assist to bring lEs back into bed to return to supine.    Transfers Overall transfer level: Needs assistance Equipment used: Rolling walker (2 wheels), None Transfers: Sit to/from Stand Sit to Stand: Min assist           General transfer comment: Min A to power to standing from EOB x3, flexed trunk. LOB back onto bed x2.    Ambulation/Gait Ambulation/Gait assistance: Mod assist, Max assist Gait Distance (Feet): 3 Feet Assistive device: Rolling walker (2 wheels), 1 person hand held assist Gait Pattern/deviations: Trunk flexed, Step-to pattern, Narrow base of support Gait velocity: decreased Gait velocity interpretation: <1.8 ft/sec, indicate of risk for recurrent falls  General Gait Details: Attempted to ambulate pt using RW around bed to chair however pt  unable to problem solve how to utilize RW correctly resulting in worsening trunk/hip/knee flexion and poor safety, aborting RW. Able to side step along side bed with HHA and Mod A for weight shifting/balance with flexed trunk/hips with decreased ability to clear LEs. Needs plus 2 to safely ambulate away from bed.  Stairs            Wheelchair Mobility    Modified Rankin (Stroke Patients Only)       Balance Overall balance assessment: Needs assistance Sitting-balance support: Feet supported, No upper extremity supported Sitting balance-Leahy Scale: Poor Sitting balance - Comments: LOB posteriorly on 3 occasions needing Mod-Max A to bring trunk upright. Postural control: Posterior lean Standing balance support: During functional activity, Reliant on assistive device for balance, Bilateral upper extremity supported Standing balance-Leahy Scale: Poor Standing balance comment: Requires UE support in standing and external support for dynamic tasks.                             Pertinent Vitals/Pain Pain Assessment Pain Assessment: No/denies pain    Home Living Family/patient expects to be discharged to:: Group home Living Arrangements: Alone Available Help at Discharge: Friend(s) Type of Home: House Home Access: Level entry       Home Layout: Two level;Able to live on main level with bedroom/bathroom Home Equipment: None Additional Comments: conflicting info in chart vs by patient as well as inconsistencies. Initially pt reporting he had family (aunt/uncle) that visit him every other day and make sure he takes his meds and checks on him however later in interview pt reports he does not have family that visit him and they are dead or living in [place]. Unsure of accuracy of any info obtained today.    Prior Function Prior Level of Function : Independent/Modified Independent;Patient poor historian/Family not available             Mobility Comments: Independent, walks  to church weekly. Group home landlord manages medications per report ADLs Comments: independent     Hand Dominance   Dominant Hand: (P) Right    Extremity/Trunk Assessment   Upper Extremity Assessment Upper Extremity Assessment: Defer to OT evaluation;Difficult to assess due to impaired cognition    Lower Extremity Assessment Lower Extremity Assessment: Generalized weakness;Difficult to assess due to impaired cognition    Cervical / Trunk Assessment Cervical / Trunk Assessment: Normal  Communication   Communication: No difficulties  Cognition Arousal/Alertness: Awake/alert Behavior During Therapy: WFL for tasks assessed/performed Overall Cognitive Status: Impaired/Different from baseline Area of Impairment: Orientation, Attention, Following commands, Safety/judgement, Awareness, Problem solving                 Orientation Level: Disoriented to, Time Current Attention Level: Sustained   Following Commands: Follows one step commands inconsistently, Follows one step commands with increased time Safety/Judgement: Decreased awareness of safety, Decreased awareness of deficits Awareness: Intellectual Problem Solving: Slow processing, Decreased initiation, Difficulty sequencing, Requires verbal cues, Requires tactile cues General Comments: Repeats self frequently, tangential at times. No awareness of safety/deficits, asking if he can go home today despite not being able to walk. Impaired initiation and sequencing, delayed processing at times.        General Comments      Exercises     Assessment/Plan    PT Assessment Patient needs continued PT services  PT Problem List Decreased  strength;Decreased mobility;Decreased safety awareness;Decreased balance;Decreased cognition;Decreased knowledge of use of DME       PT Treatment Interventions Therapeutic exercise;DME instruction;Gait training;Balance training;Patient/family education;Therapeutic activities;Functional  mobility training;Cognitive remediation;Wheelchair mobility training    PT Goals (Current goals can be found in the Care Plan section)  Acute Rehab PT Goals Patient Stated Goal: to go home today PT Goal Formulation: With patient Time For Goal Achievement: 10/27/22 Potential to Achieve Goals: Fair    Frequency Min 3X/week     Co-evaluation               AM-PAC PT "6 Clicks" Mobility  Outcome Measure Help needed turning from your back to your side while in a flat bed without using bedrails?: A Little Help needed moving from lying on your back to sitting on the side of a flat bed without using bedrails?: Total Help needed moving to and from a bed to a chair (including a wheelchair)?: A Lot Help needed standing up from a chair using your arms (e.g., wheelchair or bedside chair)?: A Lot Help needed to walk in hospital room?: Total Help needed climbing 3-5 steps with a railing? : Total 6 Click Score: 10    End of Session Equipment Utilized During Treatment: Gait belt Activity Tolerance: Patient limited by fatigue;Other (comment) (cognition) Patient left: in bed;with call bell/phone within reach;with bed alarm set Nurse Communication: Mobility status PT Visit Diagnosis: Muscle weakness (generalized) (M62.81);Difficulty in walking, not elsewhere classified (R26.2);Unsteadiness on feet (R26.81)    Time: 6503-5465 PT Time Calculation (min) (ACUTE ONLY): 24 min   Charges:   PT Evaluation $PT Eval Moderate Complexity: 1 Mod PT Treatments $Therapeutic Activity: 8-22 mins        Seth Brock, PT, DPT Acute Rehabilitation Services Secure chat preferred Office [phone]     Blake Divine A Hunter Pinkard 10/13/2022, 12:06 PM

## 2022-10-13 NOTE — Progress Notes (Signed)
Brief Neuro Update:  Etiology of breakthrough seizures likely lowered seizure threshold 2/2 influenza A. Please see Dr. Alene Mires note from overnight regarding Klonopin bridge. Decreased Depakote dosing due to supratherapeutic levels and restart. No other changes to his AEDs. Depakote levels are now normal.  At this time, we will signoff. Please feel free to contact us with any questions or concerns.  Erick Blinks [facility] Pager Number [phone]

## 2022-10-14 DIAGNOSIS — R569 Unspecified convulsions: Secondary | ICD-10-CM | POA: Diagnosis not present

## 2022-10-14 DIAGNOSIS — I4892 Unspecified atrial flutter: Secondary | ICD-10-CM | POA: Diagnosis not present

## 2022-10-14 DIAGNOSIS — D696 Thrombocytopenia, unspecified: Secondary | ICD-10-CM | POA: Diagnosis not present

## 2022-10-14 DIAGNOSIS — J101 Influenza due to other identified influenza virus with other respiratory manifestations: Secondary | ICD-10-CM | POA: Diagnosis not present

## 2022-10-14 DIAGNOSIS — R4182 Altered mental status, unspecified: Secondary | ICD-10-CM | POA: Diagnosis not present

## 2022-10-14 LAB — CBC
HCT: 34.2 % — ABNORMAL LOW (ref 39.0–52.0)
Hemoglobin: 11.5 g/dL — ABNORMAL LOW (ref 13.0–17.0)
MCH: 25.1 pg — ABNORMAL LOW (ref 26.0–34.0)
MCHC: 33.6 g/dL (ref 30.0–36.0)
MCV: 74.5 fL — ABNORMAL LOW (ref 80.0–100.0)
Platelets: 56 10*3/uL — ABNORMAL LOW (ref 150–400)
RBC: 4.59 MIL/uL (ref 4.22–5.81)
RDW: 15.5 % (ref 11.5–15.5)
WBC: 3.3 10*3/uL — ABNORMAL LOW (ref 4.0–10.5)
nRBC: 0 % (ref 0.0–0.2)

## 2022-10-14 LAB — BASIC METABOLIC PANEL
Anion gap: 10 (ref 5–15)
BUN: 12 mg/dL (ref 6–20)
CO2: 25 mmol/L (ref 22–32)
Calcium: 9.6 mg/dL (ref 8.9–10.3)
Chloride: 100 mmol/L (ref 98–111)
Creatinine, Ser: 0.93 mg/dL (ref 0.61–1.24)
GFR, Estimated: >60 mL/min (ref >60)
Glucose, Bld: 83 mg/dL (ref 70–99)
Potassium: 3.6 mmol/L (ref 3.5–5.1)
Sodium: 135 mmol/L (ref 135–145)
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
## 2022-10-14 NOTE — TOC Progression Note (Cosign Needed)
Transition of Care [facility]) - Progression Note    Patient Details  Name: Seth Brock MRN: [id number] Date of Birth: 11-02-1960  Transition of [facility]) CM/SW Contact  Carmina Miller, [place] Phone Number: 10/14/2022, 11:50 AM  Clinical Narrative:     RE: Seth Brock Date of Birth: Jun 25, 2061 Date: 10/14/22  Please be advised that the above-named patient will require a short-term nursing home stay - anticipated 30 days or less for rehabilitation and strengthening.  The plan is for return home.   Expected Discharge Plan: Skilled Nursing Facility Barriers to Discharge: Continued Medical Work up  Expected Discharge Plan and Services In-house Referral: Clinical Social Work     Living arrangements for the past 2 months: Boarding House                                       Social Determinants of Health (SDOH) Interventions SDOH Screenings   Food Insecurity: Food Insecurity Present (10/12/2022)  Housing: Medium Risk (10/12/2022)  Transportation Needs: No Transportation Needs (10/12/2022)  Utilities: Not At Risk (10/12/2022)  Depression (PHQ2-9): Low Risk  (09/27/2021)  Tobacco Use: High Risk (10/11/2022)    Readmission Risk Interventions     No data to display
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
## 2022-10-14 NOTE — Progress Notes (Signed)
Hospital day#1 Subjective:   Summary: Seth Brock is a 61 y/o male with history of intellectual disability, seizure disorder, HTN, CAD, and HLD that presents for acute altered mental status and admitted for further evaluation of his mental status and seizures    Overnight Events: no acute overnight events  He states that he slept ok last night. His cough is about the same as yesterday. He is eating and drinking well, but states he is not being fed much. He has not gotten out of bed much since he has been here. No pain, shortness of breath. No bleeding currently  Objective:  Vital signs in last 24 hours: Vitals:   10/13/22 0800 10/13/22 1947 10/14/22 0042 10/14/22 0410  BP:  103/69 116/71 107/74  Pulse:  65 64 70  Resp:  18 14 17   Temp: 98.5 F (36.9 C) 98 F (36.7 C) 98 F (36.7 C) 98 F (36.7 C)  TempSrc: Oral Oral Oral Axillary  SpO2:  95% 97% 100%  Weight:      Height:       Supplemental O2: Room Air SpO2: 100 % Filed Weights   10/11/22 1802  Weight: 85.3 kg    Physical Exam:  Constitutional:chronically ill-appearing man sitting in bed, in no acute distress HENT: normocephalic atraumatic, moist mucous membranes Neck: supple Cardiovascular: regular rate and rhythm, no m/r/g Pulmonary/Chest: normal work of breathing on room air, lungs clear to auscultation bilaterally. No wheezing Abdominal: normal BS, soft, non-tender, non-distended.  MSK: normal bulk and tone. Moving in bed. No pitting edema Neurological: alert & oriented to person, month (not year), location, and situation today, responding to all questions, moving all extremities Skin: warm and dry. No ecchymoses, petechia, or purpura on thorough skin exam. Chronic abrasion on medial R palmar surface. Psych: Pleasant mood and affect    Intake/Output Summary (Last 24 hours) at 10/14/2022 0616 Last data filed at 10/14/2022 10/16/2022 Gross per 24 hour  Intake --  Output 1050 ml  Net -1050 ml   Net IO Since  Admission: -1,050 mL [10/14/22 0616]  Pertinent Labs:    Latest Ref Rng & Units 10/13/2022    4:15 AM 10/12/2022    5:48 PM 10/12/2022    5:25 AM  CBC  WBC 4.0 - 10.5 K/uL 3.3   3.7   Hemoglobin 13.0 - 17.0 g/dL 10/14/2022   40.9   Hematocrit 39.0 - 52.0 % 36.1   39.0   Platelets 150 - 400 K/uL 57  59  67        Latest Ref Rng & Units 10/14/2022    3:43 AM 10/13/2022    4:15 AM 10/12/2022    5:25 AM  CMP  Glucose 70 - 99 mg/dL 83  89  55   BUN 6 - 20 mg/dL 12  12  19    Creatinine 0.61 - 1.24 mg/dL 10/14/2022   9.14   Sodium 135 - 145 mmol/L 135  134  134   Potassium 3.5 - 5.1 mmol/L 3.6  3.7  3.9   Chloride 98 - 111 mmol/L 100  96  98   CO2 22 - 32 mmol/L 25  26  24    Calcium 8.9 - 10.3 mg/dL 9.6  9.9  9.6   Total Protein 6.5 - 8.1 g/dL   8.1   Total Bilirubin 0.3 - 1.2 mg/dL   0.8   Alkaline Phos 38 - 126 U/L   43   AST 15 - 41 U/L  46   ALT 0 - 44 U/L   18     Imaging: No results found.  Assessment/Plan:   Principal Problem:   Seizure (HCC) Active Problems:   Leukocytopenia   Benign essential hypertension   Atrial fibrillation and flutter (HCC)   Thrombocytopenia, unspecified (HCC)   Hyperlipidemia   Intellectual disability   Influenza A   Hypoglycemia   Patient Summary: Seth Brock is a 61 y/o male with history of intellectual disability, seizure disorder, HTN, CAD, and HLD that presents for acute altered mental status 2/2 seizures vs EtOH use vs acute viral illness now improving   AMS, resolved Seizure Intellectual Disability Influenza A Patient presenting with AMS with reports of multiple seizures episodes in the past week at boarding home. Hypoglycemic, elevated Depakote levels, CK elevated to 447,  U/A with +ketones on admission, likely in the setting of seizure activity and starvation ketosis. CXR, CT head normal. Neurology evaluated on12/23, and recommends continuation of anti seizure medication, though decreased levels of Depakote given recent  increased level. Patient is influenza A positive, previously asymptomatic, but endorsing URI sxs on 12/24, which could lower seizure threshold. Now on day 2 of Klonopin to bridge during acute illness.  Patient is alert and oriented to self, month, place and situation, not year. Given hx of recent EtOH use, patient remains on CIWA with low scores. Will continue seizure management per neurology, Osetalmivir , and CIWA protocol without. Neurology has signed off. -Neurology consulted, appreciate recs -Repeat Depakote today, within therapeutic range -CIWA without Ativan -Osetalmivir 75 mg for 5 days (12/24- -Decrease Depakote to 750 twice daily (home dose 1 g twice daily) (12-24- -Continue Keppra 2 g twice daily -Continue Vimpat 300 mg twice daily -Add Klonopin 0.5 mg twice daily for 2 days (10-14-11/25, last dose today in PM)  Acute on Chronic Leukocytopenia Acute on Chronic Thrombocytopenia Continues to be asymptomatic, no bleeding, or with petechiae or purpura on exam. WBC today 3.3, PLT 56 K.  Leukopenia documented since 2013. Chronically intermittently thrombocytopenic, though decreased this admission. Peripheral smear with normal morphology, thrombocytopenia, and increased left shift. Immature platelet function 12.5. Normal d-dimer and mildly elevated fibrinogen. Normal PT/INR/PTT. Folate is normal, and B12 is markedly elevated.  Platelet function assay elevated; discussed with Dr. Arbutus Ped from [facility]. After discussion, it is likely that patients' chronic thrombocytopenia could be in the setting of anti seizure and rate control medications. However, recent acute drop in the setting of viral illness vs EtOH use. Patient is asymptomatic; no concerns for bleeding at this time. Patient is now stable and awaiting short SNF placement. Will continue to monitor CBC and outpatient referral to hematology with Dr. Candise Che or Dr. Leonides Schanz for further work up -Trend CBC -Monitor for signs of bleeding -Outpatient follow  up with hematology -Repeat CBC in 4 weeks after resolution of viral illness  Atrial flutter Rate controlled on diltiazem -Continue Diltiazem 240 mg daily -Continue on Tele  Hypercalcemia Likely in the setting of dehydration -Continue to monitor   Diet: Normal VTE: Enoxaparin Code: Full PT/OT recs: Short term SNF for rehab; will discuss with landlord. Question patient's ability to make decision about SNF placement options. Will defer to social work to check on availability vs close proximity to his current address of residence Family Update:   Dispo: Anticipated discharge to  pending PT evaluation  in 2 days pending clinical improvement.   Morene Crocker, MD Internal Medicine Resident PGY-1 Please contact the on call pager after 5 pm and  on weekends at [phone].

## 2022-10-15 ENCOUNTER — Ambulatory Visit: Payer: Medicaid Other | Admitting: Nurse Practitioner

## 2022-10-15 DIAGNOSIS — J101 Influenza due to other identified influenza virus with other respiratory manifestations: Secondary | ICD-10-CM | POA: Diagnosis not present

## 2022-10-15 DIAGNOSIS — R569 Unspecified convulsions: Secondary | ICD-10-CM | POA: Diagnosis not present

## 2022-10-15 LAB — BASIC METABOLIC PANEL
Anion gap: 6 (ref 5–15)
BUN: 16 mg/dL (ref 6–20)
CO2: 27 mmol/L (ref 22–32)
Calcium: 10 mg/dL (ref 8.9–10.3)
Chloride: 104 mmol/L (ref 98–111)
Creatinine, Ser: 0.84 mg/dL (ref 0.61–1.24)
GFR, Estimated: >60 mL/min (ref >60)
Glucose, Bld: 109 mg/dL — ABNORMAL HIGH (ref 70–99)
Potassium: 4.1 mmol/L (ref 3.5–5.1)
Sodium: 137 mmol/L (ref 135–145)

## 2022-10-15 LAB — CBC
HCT: 36.1 % — ABNORMAL LOW (ref 39.0–52.0)
Hemoglobin: 12.1 g/dL — ABNORMAL LOW (ref 13.0–17.0)
MCH: 25 pg — ABNORMAL LOW (ref 26.0–34.0)
MCHC: 33.5 g/dL (ref 30.0–36.0)
MCV: 74.6 fL — ABNORMAL LOW (ref 80.0–100.0)
Platelets: 78 10*3/uL — ABNORMAL LOW (ref 150–400)
RBC: 4.84 MIL/uL (ref 4.22–5.81)
RDW: 15.3 % (ref 11.5–15.5)
WBC: 3 10*3/uL — ABNORMAL LOW (ref 4.0–10.5)
nRBC: 0 % (ref 0.0–0.2)

## 2022-10-15 LAB — GLUCOSE, CAPILLARY: Glucose-Capillary: 182 mg/dL — ABNORMAL HIGH (ref 70–99)

## 2022-10-15 LAB — LEVETIRACETAM LEVEL: Levetiracetam Lvl: 19.6 ug/mL (ref 10.0–40.0)

## 2022-10-15 NOTE — TOC Progression Note (Addendum)
Transition of Care [facility]) - Progression Note    Patient Details  Name: Darrian Grzelak MRN: [id number] Date of Birth: [birth year]  [facility]) CM/SW Contact  Mearl Latin, LCSW Phone Number: 10/15/2022, 9:17 AM  Clinical Narrative:    9am-CSW left message for [facility] to ascertain bed availability. Left vm for Zoe Lan as staff state patient confused.   12pm-[facility] able to accept patient. They will begin insurance process.   CSW uploaded clinicals to [facility]; their office is closed until Thursday.    Expected Discharge Plan: Skilled Nursing Facility Barriers to Discharge: Continued Medical Work up  Expected Discharge Plan and Services In-house Referral: Clinical Social Work     Living arrangements for the past 2 months: Boarding House                                       Social Determinants of Health (SDOH) Interventions SDOH Screenings   Food Insecurity: Food Insecurity Present (10/12/2022)  Housing: Medium Risk (10/12/2022)  Transportation Needs: No Transportation Needs (10/12/2022)  Utilities: Not At Risk (10/12/2022)  Depression (PHQ2-9): Low Risk  (09/27/2021)  Tobacco Use: High Risk (10/11/2022)    Readmission Risk Interventions     No data to display

## 2022-10-15 NOTE — Progress Notes (Signed)
Physical Therapy Treatment Patient Details Name: Seth Brock MRN: [id number] DOB: Jul 12, 1961 Today's Date: 10/15/2022   History of Present Illness Patient is a 61 y/o male who presents on 12/22 with confusion, unsteady gait and seizures. Found to have recurrent breakthrough seizures and + Flu A. PMH includes intellectual disability, CAD, Epilepsy seizures, A-fib, HTN.    PT Comments    Pt making good progress with mobility. Able to amb short distance in room with assistance. Continue to recommend SNF at dc to get pt back to his baseline with mobility.    Recommendations for follow up therapy are one component of a multi-disciplinary discharge planning process, led by the attending physician.  Recommendations may be updated based on patient status, additional functional criteria and insurance authorization.  Follow Up Recommendations  Skilled nursing-short term rehab (<3 hours/day) Can patient physically be transported by private vehicle: Yes   Assistance Recommended at Discharge Frequent or constant Supervision/Assistance  Patient can return home with the following A little help with walking and/or transfers;Assistance with cooking/housework;Direct supervision/assist for medications management;Assist for transportation;A little help with bathing/dressing/bathroom;Direct supervision/assist for financial management;Help with stairs or ramp for entrance   Equipment Recommendations  Other (comment) (TBD)    Recommendations for Other Services       Precautions / Restrictions Precautions Precautions: Fall     Mobility  Bed Mobility Overal bed mobility: Needs Assistance Bed Mobility: Supine to Sit     Supine to sit: Min guard     General bed mobility comments: Assist for safety and lines    Transfers Overall transfer level: Needs assistance Equipment used: Rolling walker (2 wheels), Ambulation equipment used Transfers: Sit to/from Stand, Bed to chair/wheelchair/BSC Sit to  Stand: Min assist           General transfer comment: Assist to bring hips up and for balance. Used Stedy for bed to chair and then stood from chair with walker and amb. Transfer via Lift Equipment: Stedy  Ambulation/Gait Ambulation/Gait assistance: Editor, commissioning (Feet): 30 Feet Assistive device: Rolling walker (2 wheels) Gait Pattern/deviations: Step-through pattern, Decreased step length - left, Decreased step length - right, Decreased stride length, Trunk flexed Gait velocity: decr Gait velocity interpretation: <1.8 ft/sec, indicate of risk for recurrent falls   General Gait Details: Assist for balance. Verbal cues to stand more erect   Stairs             Wheelchair Mobility    Modified Rankin (Stroke Patients Only)       Balance Overall balance assessment: Needs assistance Sitting-balance support: Feet supported, No upper extremity supported Sitting balance-Leahy Scale: Fair     Standing balance support: Bilateral upper extremity supported, During functional activity, Reliant on assistive device for balance Standing balance-Leahy Scale: Poor Standing balance comment: walker and min guard for static standing                            Cognition Arousal/Alertness: Awake/alert Behavior During Therapy: WFL for tasks assessed/performed Overall Cognitive Status: No family/caregiver present to determine baseline cognitive functioning Area of Impairment: Orientation, Attention, Following commands, Safety/judgement, Awareness, Problem solving                 Orientation Level: Disoriented to, Time, Place Current Attention Level: Sustained   Following Commands: Follows one step commands consistently Safety/Judgement: Decreased awareness of deficits Awareness: Intellectual Problem Solving: Requires verbal cues, Slow processing  Exercises      General Comments General comments (skin integrity, edema, etc.): VSS on RA       Pertinent Vitals/Pain Pain Assessment Pain Assessment: No/denies pain    Home Living                          Prior Function            PT Goals (current goals can now be found in the care plan section) Progress towards PT goals: Progressing toward goals    Frequency    Min 2X/week      PT Plan Current plan remains appropriate;Frequency needs to be updated    Co-evaluation              AM-PAC PT "6 Clicks" Mobility   Outcome Measure  Help needed turning from your back to your side while in a flat bed without using bedrails?: A Little Help needed moving from lying on your back to sitting on the side of a flat bed without using bedrails?: A Little Help needed moving to and from a bed to a chair (including a wheelchair)?: A Little Help needed standing up from a chair using your arms (e.g., wheelchair or bedside chair)?: A Little Help needed to walk in hospital room?: A Little Help needed climbing 3-5 steps with a railing? : Total 6 Click Score: 16    End of Session Equipment Utilized During Treatment: Gait belt Activity Tolerance: Patient tolerated treatment well Patient left: in chair;with call bell/phone within reach;with chair alarm set Nurse Communication: Mobility status PT Visit Diagnosis: Muscle weakness (generalized) (M62.81);Difficulty in walking, not elsewhere classified (R26.2);Unsteadiness on feet (R26.81)     Time: 1324-4010 PT Time Calculation (min) (ACUTE ONLY): 22 min  Charges:  $Gait Training: 8-22 mins                     [facility] PT Acute Rehabilitation Services Office [phone]    Angelina Ok [facility] 10/15/2022, 1:31 PM

## 2022-10-15 NOTE — Telephone Encounter (Signed)
Pt no-showed for appt here with Dr Elease Hashimoto on 10/10/22. Was rescheduled to see Robin Searing, NP on 10/15/22, but pt was admitted to [facility] hospital on 10/11/22. Currently still admitted for disorientation/Influenza A. Robin Searing, NP aware.

## 2022-10-15 NOTE — Plan of Care (Signed)
  Problem: Education: Goal: Knowledge of General Education information will improve Description Including pain rating scale, medication(s)/side effects and non-pharmacologic comfort measures Outcome: Progressing   

## 2022-10-15 NOTE — Progress Notes (Signed)
RE: Seth Brock Date of Birth: 02/15/2061 Date: 10/15/22  Please be advised that the above-named patient will require a short-term nursing home stay - anticipated 30 days or less for rehabilitation and strengthening.  The plan is for return home.

## 2022-10-15 NOTE — Progress Notes (Signed)
Hospital day#2 Subjective:   Summary: Seth Brock is a 61 y/o male with history of intellectual disability, seizure disorder, HTN, CAD, and HLD that presents for acute altered mental status and admitted for further evaluation of his mental status and seizures    Overnight Events: no acute overnight events  He states that he is feeling ok this morning. He is eager to go to rehab to get stronger. Eating and drinking well. No other complaints at this time   Objective:  Vital signs in last 24 hours: Vitals:   10/15/22 0400 10/15/22 0500 10/15/22 0600 10/15/22 0800  BP: 118/75   123/83  Pulse: 75  69 71  Resp:      Temp:    97.6 F (36.4 C)  TempSrc:    Axillary  SpO2: 95%  96% 96%  Weight:  77.9 kg    Height:       Supplemental O2: Room Air SpO2: 96 % Filed Weights   10/11/22 1802 10/15/22 0500  Weight: 85.3 kg 77.9 kg    Physical Exam:  Constitutional:chronically ill-appearing man sitting in bed, in no acute distress HENT: normocephalic atraumatic, moist mucous membranes Neck: supple Cardiovascular: no m/r/g Pulmonary/Chest: normal work of breathing on room air, lungs clear to auscultation bilaterally. No wheezing Abdominal: normal BS, soft, non-tender, non-distended.  MSK: normal bulk and tone. Moving all extremities. No pitting edema Neurological: alert & oriented to person, month (not year), location, and situation gain, responding to all questions, moving all extremities Skin: warm and dry. No ecchymoses, petechia, or purpura on thorough skin exam  Psych: Pleasant mood and affect   No intake or output data in the 24 hours ending 10/15/22 1013  Net IO Since Admission: -1,050 mL [10/15/22 1013]  Pertinent Labs:    Latest Ref Rng & Units 10/15/2022    7:17 AM 10/14/2022    3:43 AM 10/13/2022    4:15 AM  CBC  WBC 4.0 - 10.5 K/uL 3.0  3.3  3.3   Hemoglobin 13.0 - 17.0 g/dL 69.6  29.5  28.4   Hematocrit 39.0 - 52.0 % 36.1  34.2  36.1   Platelets 150 - 400  K/uL 78  56  57        Latest Ref Rng & Units 10/15/2022    7:17 AM 10/14/2022    3:43 AM 10/13/2022    4:15 AM  CMP  Glucose 70 - 99 mg/dL 132  83  89   BUN 6 - 20 mg/dL 16  12  12    Creatinine 0.61 - 1.24 mg/dL  4.40  1.02   Sodium 135 - 145 mmol/L 137  135  134   Potassium 3.5 - 5.1 mmol/L 4.1  3.6  3.7   Chloride 98 - 111 mmol/L 104  100  96   CO2 22 - 32 mmol/L 27  25  26    Calcium 8.9 - 10.3 mg/dL 7.25  9.6  9.9     Imaging: No results found.  Assessment/Plan:   Principal Problem:   Seizure (HCC) Active Problems:   Leukocytopenia   Benign essential hypertension   Atrial fibrillation and flutter (HCC)   Thrombocytopenia, unspecified (HCC)   Hyperlipidemia   Intellectual disability   Influenza A   Hypoglycemia   Patient Summary: Seth Brock is a 61 y/o male with history of intellectual disability, seizure disorder, HTN, CAD, and HLD that presents for acute altered mental status 2/2 seizures vs EtOH use vs acute viral illness, now  improving  AMS, resolved Seizure Intellectual Disability Influenza A Patient presenting with AMS with reports of multiple seizures episodes in the past week at boarding home. Hypoglycemic, elevated Depakote levels, CK elevated to 447,  U/A with +ketones on admission, likely in the setting of seizure activity and starvation ketosis. CXR, CT head normal. Neurology evaluated on12/23, and recommends continuation of anti seizure medication, though decreased levels of Depakote given recent increased level. Completed a course of Klonopin. Now on day 3 of Osetalmivir and continues to be alert and oriented back to his baseline.  -Osetalmivir 75 mg for 5 days (12/24- -Decrease Depakote to 750 twice daily (home dose 1 g twice daily) (12-24- -Continue Keppra 2 g twice daily -Continue Vimpat 300 mg twice daily -Add Klonopin 0.5 mg twice daily, completed (10-14-11/25)  Acute on Chronic Leukocytopenia Acute on Chronic  Thrombocytopenia Continues to be asymptomatic, no bleeding, or with petechiae or purpura on exam. WBC today 3.0, PLT 78 K today, improved from prior.  Leukopenia documented since 2013. Chronically intermittently thrombocytopenic, though decreased this admission. Peripheral smear with normal morphology, thrombocytopenia, and increased left shift. Immature platelet function 12.5. Normal d-dimer and mildly elevated fibrinogen. Normal PT/INR/PTT. Folate is normal, and B12 is markedly elevated.  Platelet function assay elevated; discussed with Dr. Arbutus Ped from Heme. Chronic thrombocytopenia could be in the setting of anti seizure and rate control medications. However, recent acute drop in the setting of viral illness vs EtOH use. Now awaiting short SNF placement. -Trend CBC -Monitor for signs of bleeding -Outpatient follow up with hematology with Dr. Candise Che or Dr. Leonides Schanz for further work up -Repeat CBC in 4 weeks after resolution of viral illness  Atrial flutter Rate controlled on diltiazem -Continue Diltiazem 240 mg daily -Continue on Tele  Diet: Normal VTE: Enoxaparin Code: Full PT/OT recs: Short term SNF for rehab; social work currently waiting bed availability Family Update:   Dispo: Anticipated discharge to  pending PT evaluation  in 2 days pending clinical improvement.   Morene Crocker, MD Internal Medicine Resident PGY-1 Please contact the on call pager after 5 pm and on weekends at [phone].

## 2022-10-16 DIAGNOSIS — F79 Unspecified intellectual disabilities: Secondary | ICD-10-CM

## 2022-10-16 DIAGNOSIS — D72819 Decreased white blood cell count, unspecified: Secondary | ICD-10-CM | POA: Diagnosis not present

## 2022-10-16 DIAGNOSIS — I4892 Unspecified atrial flutter: Secondary | ICD-10-CM | POA: Diagnosis not present

## 2022-10-16 DIAGNOSIS — Z1152 Encounter for screening for COVID-19: Secondary | ICD-10-CM | POA: Diagnosis not present

## 2022-10-16 DIAGNOSIS — J101 Influenza due to other identified influenza virus with other respiratory manifestations: Secondary | ICD-10-CM | POA: Diagnosis not present

## 2022-10-16 DIAGNOSIS — E8889 Other specified metabolic disorders: Secondary | ICD-10-CM | POA: Diagnosis not present

## 2022-10-16 DIAGNOSIS — G40909 Epilepsy, unspecified, not intractable, without status epilepticus: Secondary | ICD-10-CM | POA: Diagnosis not present

## 2022-10-16 DIAGNOSIS — D6959 Other secondary thrombocytopenia: Secondary | ICD-10-CM | POA: Diagnosis not present

## 2022-10-16 DIAGNOSIS — E162 Hypoglycemia, unspecified: Secondary | ICD-10-CM | POA: Diagnosis not present

## 2022-10-16 DIAGNOSIS — I10 Essential (primary) hypertension: Secondary | ICD-10-CM | POA: Diagnosis not present

## 2022-10-16 DIAGNOSIS — E871 Hypo-osmolality and hyponatremia: Secondary | ICD-10-CM | POA: Diagnosis not present

## 2022-10-16 DIAGNOSIS — I4891 Unspecified atrial fibrillation: Secondary | ICD-10-CM | POA: Diagnosis not present

## 2022-10-16 DIAGNOSIS — R569 Unspecified convulsions: Secondary | ICD-10-CM | POA: Diagnosis not present

## 2022-10-16 DIAGNOSIS — E860 Dehydration: Secondary | ICD-10-CM | POA: Diagnosis not present

## 2022-10-16 LAB — BASIC METABOLIC PANEL
Anion gap: 7 (ref 5–15)
BUN: 16 mg/dL (ref 6–20)
CO2: 26 mmol/L (ref 22–32)
Calcium: 10.1 mg/dL (ref 8.9–10.3)
Chloride: 104 mmol/L (ref 98–111)
Creatinine, Ser: 0.84 mg/dL (ref 0.61–1.24)
GFR, Estimated: >60 mL/min (ref >60)
Glucose, Bld: 111 mg/dL — ABNORMAL HIGH (ref 70–99)
Potassium: 4.6 mmol/L (ref 3.5–5.1)
Sodium: 137 mmol/L (ref 135–145)

## 2022-10-16 LAB — GLUCOSE, CAPILLARY: Glucose-Capillary: 93 mg/dL (ref 70–99)

## 2022-10-16 LAB — CBC
HCT: 37.8 % — ABNORMAL LOW (ref 39.0–52.0)
Hemoglobin: 12.1 g/dL — ABNORMAL LOW (ref 13.0–17.0)
MCH: 24.3 pg — ABNORMAL LOW (ref 26.0–34.0)
MCHC: 32 g/dL (ref 30.0–36.0)
MCV: 76.1 fL — ABNORMAL LOW (ref 80.0–100.0)
Platelets: 103 10*3/uL — ABNORMAL LOW (ref 150–400)
RBC: 4.97 MIL/uL (ref 4.22–5.81)
RDW: 15.5 % (ref 11.5–15.5)
WBC: 3.6 10*3/uL — ABNORMAL LOW (ref 4.0–10.5)
nRBC: 0 % (ref 0.0–0.2)

## 2022-10-16 LAB — LACOSAMIDE: Lacosamide: 15.2 ug/mL — ABNORMAL HIGH (ref 5.0–10.0)

## 2022-10-16 NOTE — Progress Notes (Signed)
Hospital day#3 Subjective:   Summary: Seth Brock is a 61 y/o male with history of intellectual disability, seizure disorder, HTN, CAD, and HLD that presents for acute altered mental status and admitted for further evaluation of his mental status and seizures    Overnight Events: no acute overnight events  Feeling great this morning, watching TV, and just finishing breakfast this AM. Improvement of cough today. No shortness of breath, fevers or chills. Patient is excited to leave as he feels better. He was told about the rehab placement and is awaiting confirmation. Spoke with Ms. Jinny Sanders and they are all in agreement with Rehab facility.  Objective:  Vital signs in last 24 hours: Vitals:   10/16/22 0000 10/16/22 0404 10/16/22 0500 10/16/22 0909  BP: 109/82 112/72  107/68  Pulse: 72 66    Resp: 19 19  14   Temp: 98.5 F (36.9 C) 98.4 F (36.9 C)  98.5 F (36.9 C)  TempSrc: Oral Oral  Oral  SpO2: 96% 96%    Weight:   79.4 kg   Height:       Supplemental O2: Room Air SpO2: 96 % Filed Weights   10/11/22 1802 10/15/22 0500 10/16/22 0500  Weight: 85.3 kg 77.9 kg 79.4 kg    Physical Exam:  Constitutional:chronically ill-appearing man sitting in bed, in no acute distress HENT: normocephalic atraumatic, moist mucous membranes Neck: supple Cardiovascular: no m/r/g Pulmonary/Chest: normal work of breathing on room air, lungs clear to auscultation bilaterally. No wheezing Abdominal: normal BS, soft, non-tender, non-distended.  MSK: normal bulk and tone. Moving all extremities. No pitting edema Neurological: alert & oriented to person, month (not year), location, and situation gain, responding to all questions, moving all extremities Skin: warm and dry. No ecchymoses, petechia, or purpura on thorough skin exam  Psych: Pleasant mood and affect    Intake/Output Summary (Last 24 hours) at 10/16/2022 1203 Last data filed at 10/15/2022 1713 Gross per 24 hour  Intake --   Output 600 ml  Net -600 ml    Net IO Since Admission: -1,650 mL [10/16/22 1203]  Pertinent Labs:    Latest Ref Rng & Units 10/16/2022    1:33 AM 10/15/2022    7:17 AM 10/14/2022    3:43 AM  CBC  WBC 4.0 - 10.5 K/uL 3.6  3.0  3.3   Hemoglobin 13.0 - 17.0 g/dL 10/16/2022  25.3  66.4   Hematocrit 39.0 - 52.0 % 37.8  36.1  34.2   Platelets 150 - 400 K/uL 103  78  56        Latest Ref Rng & Units 10/16/2022    1:33 AM 10/15/2022    7:17 AM 10/14/2022    3:43 AM  CMP  Glucose 70 - 99 mg/dL 10/16/2022  474  83   BUN 6 - 20 mg/dL 16  16  12    Creatinine 0.61 - 1.24 mg/dL 259   5.63   Sodium 135 - 145 mmol/L 137  137  135   Potassium 3.5 - 5.1 mmol/L 4.6  4.1  3.6   Chloride 98 - 111 mmol/L 104  104  100   CO2 22 - 32 mmol/L 26  27  25    Calcium 8.9 - 10.3 mg/dL 8.75  6.43  9.6     Imaging: No results found.  Assessment/Plan:   Principal Problem:   Seizure (HCC) Active Problems:   Leukocytopenia   Benign essential hypertension   Atrial fibrillation and flutter (HCC)  Thrombocytopenia, unspecified (HCC)   Hyperlipidemia   Intellectual disability   Influenza A   Hypoglycemia   Patient Summary: Fong Mccarry is a 61 y/o male with history of intellectual disability, seizure disorder, HTN, CAD, and HLD that presents for acute altered mental status 2/2 seizures vs EtOH use vs acute viral illness, now improving  Seizure Intellectual Disability Influenza A, improving AMS, resolved Patient presenting with AMS with reports of multiple seizures episodes in the past week at boarding home. Hypoglycemic, elevated Depakote levels, CK elevated to 447,  U/A with +ketones on admission, likely in the setting of seizure activity and starvation ketosis. CXR, CT head normal. Neurology evaluated on12/23, and recommends continuation of anti seizure medication, though decreased levels of Depakote given recent increased level. Completed a course of Klonopin. Now on day 4 of Osetalmivir and  continues to be alert and oriented back to his baseline.  -Osetalmivir 75 mg for 5 days (12/24- -Decrease Depakote to 750 twice daily (home dose 1 g twice daily) (12-24- -Continue Keppra 2 g twice daily -Continue Vimpat 300 mg twice daily -Completed Klonopin 0.5 mg twice daily, completed (10-14-11/25)  Acute on Chronic Leukocytopenia, improving Acute on Chronic Thrombocytopenia, improving Continues to be asymptomatic, no bleeding, or with petechiae or purpura on exam. WBC today 3.6, PLT 103 K today, highest during this hospitalization. Of note, leukopenia documented since 2013. Chronically intermittently thrombocytopenic, though decreased this admission. Peripheral smear with normal morphology, thrombocytopenia, and increased left shift. Immature platelet function 12.5. Normal d-dimer and mildly elevated fibrinogen. Normal PT/INR/PTT. Folate is normal, and B12 is markedly elevated.  Platelet function assay elevated; discussed with Dr. Arbutus Ped from Heme. Chronic thrombocytopenia likely in the setting of chronic anti seizure and rate control medications. However, recent acute drop in the setting of viral illness vs EtOH use.  -Monitor for signs of bleeding -Outpatient follow up with hematology with Dr. Candise Che or Dr. Leonides Schanz for further work up -Repeat CBC at [facility] after resolution of viral illness  Atrial flutter Rate controlled on diltiazem -Continue Diltiazem 240 mg daily -Continue on Tele  Diet: Normal VTE: Enoxaparin Code: Full PT/OT recs: Short term SNF for rehab; bed available at [facility], awaiting for  insurance approval. Jinny Sanders has been notified.   Dispo: Anticipated discharge to SNF for short term rehab in 1-2days pending insurance authorization  Morene Crocker, MD Internal Medicine Resident PGY-1 Please contact the on call pager after 5 pm and on weekends at [phone].

## 2022-10-17 LAB — CBC
HCT: 36.8 % — ABNORMAL LOW (ref 39.0–52.0)
Hemoglobin: 12.1 g/dL — ABNORMAL LOW (ref 13.0–17.0)
MCH: 24.9 pg — ABNORMAL LOW (ref 26.0–34.0)
MCHC: 32.9 g/dL (ref 30.0–36.0)
MCV: 75.9 fL — ABNORMAL LOW (ref 80.0–100.0)
Platelets: 133 10*3/uL — ABNORMAL LOW (ref 150–400)
RBC: 4.85 MIL/uL (ref 4.22–5.81)
RDW: 15.5 % (ref 11.5–15.5)
WBC: 3.7 10*3/uL — ABNORMAL LOW (ref 4.0–10.5)
nRBC: 0 % (ref 0.0–0.2)

## 2022-10-17 LAB — CBC WITH DIFFERENTIAL/PLATELET
Abs Immature Granulocytes: 0.04 10*3/uL (ref 0.00–0.07)
Basophils Absolute: 0 10*3/uL (ref 0.0–0.1)
Basophils Relative: 1 %
Eosinophils Absolute: 0.2 10*3/uL (ref 0.0–0.5)
Eosinophils Relative: 6 %
HCT: 37.2 % — ABNORMAL LOW (ref 39.0–52.0)
Hemoglobin: 12.1 g/dL — ABNORMAL LOW (ref 13.0–17.0)
Immature Granulocytes: 1 %
Lymphocytes Relative: 30 %
Lymphs Abs: 1.1 10*3/uL (ref 0.7–4.0)
MCH: 24.8 pg — ABNORMAL LOW (ref 26.0–34.0)
MCHC: 32.5 g/dL (ref 30.0–36.0)
MCV: 76.2 fL — ABNORMAL LOW (ref 80.0–100.0)
Monocytes Absolute: 0.5 10*3/uL (ref 0.1–1.0)
Monocytes Relative: 13 %
Neutro Abs: 1.9 10*3/uL (ref 1.7–7.7)
Neutrophils Relative %: 49 %
Platelets: 134 10*3/uL — ABNORMAL LOW (ref 150–400)
RBC: 4.88 MIL/uL (ref 4.22–5.81)
RDW: 15.7 % — ABNORMAL HIGH (ref 11.5–15.5)
WBC: 3.8 10*3/uL — ABNORMAL LOW (ref 4.0–10.5)
nRBC: 0 % (ref 0.0–0.2)

## 2022-10-17 LAB — IRON AND TIBC
Iron: 84 ug/dL (ref 45–182)
Saturation Ratios: 40 % — ABNORMAL HIGH (ref 17.9–39.5)
TIBC: 209 ug/dL — ABNORMAL LOW (ref 250–450)
UIBC: 125 ug/dL

## 2022-10-17 LAB — BASIC METABOLIC PANEL
Anion gap: 5 (ref 5–15)
BUN: 14 mg/dL (ref 6–20)
CO2: 26 mmol/L (ref 22–32)
Calcium: 10 mg/dL (ref 8.9–10.3)
Chloride: 107 mmol/L (ref 98–111)
Creatinine, Ser: 0.8 mg/dL (ref 0.61–1.24)
GFR, Estimated: >60 mL/min (ref >60)
Glucose, Bld: 90 mg/dL (ref 70–99)
Potassium: 4.3 mmol/L (ref 3.5–5.1)
Sodium: 138 mmol/L (ref 135–145)

## 2022-10-17 LAB — FERRITIN: Ferritin: 723 ng/mL — ABNORMAL HIGH (ref 24–336)

## 2022-10-17 LAB — GLUCOSE, CAPILLARY: Glucose-Capillary: 127 mg/dL — ABNORMAL HIGH (ref 70–99)

## 2022-10-17 MED ORDER — ENSURE ENLIVE PO LIQD
237.0000 mL | Freq: Two times a day (BID) | ORAL | Status: DC
  Administered 2022-10-18 (×2): 237 mL via ORAL

## 2022-10-17 NOTE — Progress Notes (Signed)
Physical Therapy Treatment Patient Details Name: Seth Brock MRN: [id number] DOB: 07-16-1961 Today's Date: 10/17/2022   History of Present Illness Patient is a 61 y/o male who presents on 12/22 with confusion, unsteady gait and seizures. Found to have recurrent breakthrough seizures and + Flu A. PMH includes intellectual disability, CAD, Epilepsy seizures, A-fib, HTN.    PT Comments    Pt was seen for mobility on RW and to work on transfer safety as well as obstacle clearance.  Has a good quality of movement with cues, but can get himself into a walker posture stepping out of the space and getting stuck near furniture as well.  As a result, recommending him to SNF to recover safety and independence of gait, with work on attention to the tasks, for strengthening LE's and to work on reaction time for balance.  Follow acutely to prepare for SNF discharge.   Recommendations for follow up therapy are one component of a multi-disciplinary discharge planning process, led by the attending physician.  Recommendations may be updated based on patient status, additional functional criteria and insurance authorization.  Follow Up Recommendations  Skilled nursing-short term rehab (<3 hours/day) Can patient physically be transported by private vehicle: Yes   Assistance Recommended at Discharge Frequent or constant Supervision/Assistance  Patient can return home with the following A little help with walking and/or transfers;Assistance with cooking/housework;Direct supervision/assist for medications management;Assist for transportation;A little help with bathing/dressing/bathroom;Direct supervision/assist for financial management;Help with stairs or ramp for entrance   Equipment Recommendations  Other (comment) (determine in SNF)    Recommendations for Other Services       Precautions / Restrictions Precautions Precautions: Fall Precaution Comments: watch 02 Restrictions Weight Bearing Restrictions:  No     Mobility  Bed Mobility Overal bed mobility: Needs Assistance Bed Mobility: Supine to Sit     Supine to sit: Min guard          Transfers Overall transfer level: Needs assistance Equipment used: Rolling walker (2 wheels), 1 person hand held assist Transfers: Sit to/from Stand Sit to Stand: Min guard, Min assist           General transfer comment: stood with RW and minor assist to power up and min guard to ensure steady    Ambulation/Gait Ambulation/Gait assistance: Min guard, Min assist Gait Distance (Feet): 60 Feet Assistive device: Rolling walker (2 wheels) Gait Pattern/deviations: Step-through pattern, Decreased step length - left, Decreased step length - right, Decreased stride length, Trunk flexed Gait velocity: reduced Gait velocity interpretation: <1.31 ft/sec, indicative of household ambulator Pre-gait activities: standing balance ck General Gait Details: cues for balance and to control walker as he tends to walk it into the corner   Stairs             Wheelchair Mobility    Modified Rankin (Stroke Patients Only)       Balance Overall balance assessment: Needs assistance Sitting-balance support: Feet supported Sitting balance-Leahy Scale: Good     Standing balance support: Bilateral upper extremity supported, During functional activity Standing balance-Leahy Scale: Poor                              Cognition Arousal/Alertness: Awake/alert Behavior During Therapy: Impulsive Overall Cognitive Status: No family/caregiver present to determine baseline cognitive functioning Area of Impairment: Problem solving, Awareness, Safety/judgement, Following commands, Attention                 Orientation Level:  Time, Situation Current Attention Level: Selective   Following Commands: Follows one step commands with increased time Safety/Judgement: Decreased awareness of deficits Awareness: Intellectual Problem Solving:  Requires verbal cues, Requires tactile cues, Slow processing General Comments: Repetitive questions and comments, has difficulty letting go of expectation that he is leaving today.  Safety features on chair and notified nursing he was up to avoid a fall.  Maintaining sats with walk        Exercises      General Comments General comments (skin integrity, edema, etc.): pt is distracted with tasks, focused on asking about going home and repeating PT's statements      Pertinent Vitals/Pain Pain Assessment Pain Assessment: No/denies pain    Home Living                          Prior Function            PT Goals (current goals can now be found in the care plan section) Acute Rehab PT Goals Patient Stated Goal: to go home today Progress towards PT goals: Progressing toward goals    Frequency    Min 2X/week      PT Plan Current plan remains appropriate;Frequency needs to be updated    Co-evaluation              AM-PAC PT "6 Clicks" Mobility   Outcome Measure  Help needed turning from your back to your side while in a flat bed without using bedrails?: A Little Help needed moving from lying on your back to sitting on the side of a flat bed without using bedrails?: A Little Help needed moving to and from a bed to a chair (including a wheelchair)?: A Little Help needed standing up from a chair using your arms (e.g., wheelchair or bedside chair)?: A Little Help needed to walk in hospital room?: A Little Help needed climbing 3-5 steps with a railing? : A Lot 6 Click Score: 17    End of Session Equipment Utilized During Treatment: Gait belt Activity Tolerance: Patient tolerated treatment well Patient left: in chair;with call bell/phone within reach;with chair alarm set Nurse Communication: Mobility status PT Visit Diagnosis: Muscle weakness (generalized) (M62.81);Difficulty in walking, not elsewhere classified (R26.2);Unsteadiness on feet (R26.81)      Time: 7673-4193 PT Time Calculation (min) (ACUTE ONLY): 24 min  Charges:  $Gait Training: 8-22 mins $Therapeutic Activity: 8-22 mins       Ivar Drape 10/17/2022, 5:30 PM  Samul Dada, PT PhD Acute Rehab Dept. Number: [facility] [id number] and [facility] [phone]

## 2022-10-17 NOTE — Progress Notes (Signed)
Hospital day#4 Subjective:   Summary: Seth Brock is a 61 y/o male with history of intellectual disability, seizure disorder, HTN, CAD, and HLD that presents for acute altered mental status and admitted for further evaluation of his mental status and seizures    Overnight Events: Asymptomatic bradycardia. ECG with rate of 60 and variable AV block.  Patient states he feels great today. He is ready to go home. Denies chest pain, cough, shortness of breath, palpitations/flutters, or any seizure-like activity.  Objective:  Vital signs in last 24 hours: Vitals:   10/17/22 0200 10/17/22 0230 10/17/22 0300 10/17/22 1000  BP: (!) 136/91  120/79 116/76  Pulse: 72 63 84 79  Resp: (!) 25 13 12  (!) 23  Temp:    97.7 F (36.5 C)  TempSrc:    Oral  SpO2: 96% 98% 97% 100%  Weight:      Height:       Supplemental O2: Room Air SpO2: 100 % Filed Weights   10/11/22 1802 10/15/22 0500 10/16/22 0500  Weight: 85.3 kg 77.9 kg 79.4 kg    Physical Exam:  Constitutional: well appearing man sitting in bed, in no acute distress HENT: normocephalic atraumatic, moist mucous membranes Neck: supple Cardiovascular: Regular rate, irregular rhythm, no murmurs Pulmonary/Chest: normal work of breathing on room air MSK: normal bulk and tone. Moving all extremities.  Neurological: A&O x 3. Skin: warm and dry. No ecchymoses, petechia, or purpura  Psych: Pleasant mood and affect   No intake or output data in the 24 hours ending 10/17/22 1137   Net IO Since Admission: -1,650 mL [10/17/22 1137]  Pertinent Labs:    Latest Ref Rng & Units 10/17/2022    4:27 AM 10/17/2022    4:26 AM 10/16/2022    1:33 AM  CBC  WBC 4.0 - 10.5 K/uL 3.7  3.8  3.6   Hemoglobin 13.0 - 17.0 g/dL 10/18/2022  70.6  23.7   Hematocrit 39.0 - 52.0 % 36.8  37.2  37.8   Platelets 150 - 400 K/uL 133  134  103        Latest Ref Rng & Units 10/17/2022    4:27 AM 10/16/2022    1:33 AM 10/15/2022    7:17 AM  CMP  Glucose 70 - 99  mg/dL 90  10/17/2022  315   BUN 6 - 20 mg/dL 14  16  16    Creatinine 0.61 - 1.24 mg/dL 176   1.60   Sodium 135 - 145 mmol/L 138  137  137   Potassium 3.5 - 5.1 mmol/L 4.3  4.6  4.1   Chloride 98 - 111 mmol/L 107  104  104   CO2 22 - 32 mmol/L 26  26  27    Calcium 8.9 - 10.3 mg/dL 7.37  1.06      Imaging: No results found.  Assessment/Plan:   Principal Problem:   Seizure (HCC) Active Problems:   Leukocytopenia   Benign essential hypertension   Atrial fibrillation and flutter (HCC)   Thrombocytopenia, unspecified (HCC)   Hyperlipidemia   Intellectual disability   Influenza A   Hypoglycemia   Patient Summary: Junius Faucett is a 61 y/o male with history of intellectual disability, seizure disorder, HTN, CAD, and HLD that presents for acute altered mental status 2/2 seizures vs EtOH use vs acute viral illness, now improving.  Seizure Intellectual Disability Influenza A, improving AMS, resolved Patient presenting with AMS with reports of multiple seizure episodes in the past  week at boarding home. Hypoglycemic, elevated Depakote levels, CK elevated to 447,  U/A with +ketones on admission, likely in the setting of seizure activity and starvation ketosis. Cr at baseline. CXR, CT head normal. Neurology evaluated on 12/23, and recommended continuation of anti epileptics, though decrease Depakote given recent increased level. Completed a course of Klonopin. Now on day 5 of Osetalmivir. Without symptoms of URI today. Mentation at baseline.  -Osetalmivir 75 mg for 5 days (12/24-28) -Decrease Depakote to 750 twice daily (home dose 1 g twice daily) (12-24- -Continue Keppra 2 g twice daily -Continue Vimpat 300 mg twice daily -Completed Klonopin 0.5 mg twice daily, completed (10-14-11/25)  Acute on Chronic Leukocytopenia, improving Acute on Chronic Thrombocytopenia, improving Continues to be asymptomatic, no bleeding, or petechiae or purpura on exam. WBC today 3.8, PLT 134k today,  highest during this hospitalization. Leukopenia documented since 2013. Chronic intermittent thrombocytopenia, though worse this admission. Peripheral smear with normal morphology, thrombocytopenia, and increased left shift. Immature platelet function, 12.5. Normal d-dimer and mildly elevated fibrinogen. Normal PT/INR/PTT. Folate is normal but B12 is markedly elevated.  Platelet function assay elevated; discussed with Dr. Arbutus Ped from Hematology. Chronic thrombocytopenia likely in the setting of chronic anti seizure and rate control medications. However, recent acute drop likely 2/2 viral illness vs EtOH use.  -Continue to monitor for signs of bleeding -Outpatient follow up with hematology with Dr. Candise Che or Dr. Leonides Schanz for further work up -Repeat CBC at [facility] after resolution of viral illness  Atrial flutter Patient has history of persistent atrial flutter. Previously not felt appropriate for [facility] given seizures and fall risk. Patient with asymptomatic bradycardia overnight. Rate in 70s and 80s today. Will transition to short acting diltiazem if continues with bradycardic episodes.  - Continue Diltiazem 240 mg daily - Ordered telemetry  Diet: Normal VTE: Enoxaparin Code: Full PT/OT recs: Short term SNF for rehab; bed available at [facility], awaiting for  insurance approval. Jinny Sanders has been notified.   Dispo: Anticipated discharge to SNF for short term rehab in 1-2days pending insurance authorization  Adron Bene, MD Internal Medicine Resident PGY-1 Please contact the on call pager after 5 pm and on weekends at [phone].

## 2022-10-17 NOTE — Progress Notes (Signed)
Initial Nutrition Assessment  DOCUMENTATION CODES:   Not applicable  INTERVENTION:   Multivitamin w/ minerals daily Encourage good PO intake  Ensure Enlive po BID, each supplement provides 350 kcal and 20 grams of protein.  NUTRITION DIAGNOSIS:   Increased nutrient needs related to acute illness (Influenza A) as evidenced by estimated needs.  GOAL:   Patient will meet greater than or equal to 90% of their needs  MONITOR:   PO intake, Supplement acceptance, Labs, Weight trends  REASON FOR ASSESSMENT:   Malnutrition Screening Tool    ASSESSMENT:   61 y.o. male presented to the ED with altered mental status. PMH includes HTN, intellectual disability, CAD, seizure disorder, and HLD. Pt admitted with Influenza A and increased seizures at home.   RD attempted visit x2, pt unavailable. RD spoke with RN, RN unsure how well pt ate this morning. RN stated that pt reported that he was not getting enough fluids in. Noted that pt has a bed offer, just waiting on insurance authorization. No meal intakes have been documented. Per EMR, pt has had a 8.8% weight loss within 5 months, this is not clinically significant for time frame.   RD to order nutritional supplements due to recent weight loss and acute illness.   Medications reviewed and include: Folic acid, MVI, Thiamine Labs reviewed.   NUTRITION - FOCUSED PHYSICAL EXAM:  Deferred to follow-up.  Diet Order:   Diet Order             Diet regular Room service appropriate? Yes; Fluid consistency: Thin  Diet effective now                   EDUCATION NEEDS:   No education needs have been identified at this time  Skin:  Skin Assessment: Reviewed RN Assessment  Last BM:  12/27  Height:   Ht Readings from Last 1 Encounters:  10/11/22 6\' 2"  (1.88 m)    Weight:   Wt Readings from Last 1 Encounters:  10/16/22 79.4 kg    Ideal Body Weight:  86.4 kg  BMI:  Body mass index is 22.47 kg/m.  Estimated Nutritional  Needs:   Kcal:  2000-2200  Protein:  100-115 grams  Fluid:  >/= 2 L    10/18/22 RD, LDN Clinical Dietitian See [facility] for contact information.

## 2022-10-17 NOTE — TOC Progression Note (Signed)
Transition of Care [facility]) - Progression Note    Patient Details  Name: Seth Brock MRN: [id number] Date of Birth: Oct 09, 1961  Transition of Care [facility]) CM/SW Contact  Mearl Latin, LCSW Phone Number: 10/17/2022, 2:35 PM  Clinical Narrative:    Insurance approval still pending. Pasrr received and placed on Fl2.    Expected Discharge Plan: Skilled Nursing Facility Barriers to Discharge: Continued Medical Work up  Expected Discharge Plan and Services In-house Referral: Clinical Social Work     Living arrangements for the past 2 months: Boarding House                                       Social Determinants of Health (SDOH) Interventions SDOH Screenings   Food Insecurity: Food Insecurity Present (10/12/2022)  Housing: Medium Risk (10/12/2022)  Transportation Needs: No Transportation Needs (10/12/2022)  Utilities: Not At Risk (10/12/2022)  Depression (PHQ2-9): Low Risk  (09/27/2021)  Tobacco Use: High Risk (10/11/2022)    Readmission Risk Interventions     No data to display

## 2022-10-17 NOTE — Plan of Care (Signed)

## 2022-10-18 DIAGNOSIS — F17210 Nicotine dependence, cigarettes, uncomplicated: Secondary | ICD-10-CM | POA: Diagnosis not present

## 2022-10-18 DIAGNOSIS — R569 Unspecified convulsions: Secondary | ICD-10-CM | POA: Diagnosis not present

## 2022-10-18 DIAGNOSIS — I10 Essential (primary) hypertension: Secondary | ICD-10-CM | POA: Diagnosis not present

## 2022-10-18 LAB — CBC
HCT: 38.7 % — ABNORMAL LOW (ref 39.0–52.0)
Hemoglobin: 12.5 g/dL — ABNORMAL LOW (ref 13.0–17.0)
MCH: 24.7 pg — ABNORMAL LOW (ref 26.0–34.0)
MCHC: 32.3 g/dL (ref 30.0–36.0)
MCV: 76.5 fL — ABNORMAL LOW (ref 80.0–100.0)
Platelets: 123 10*3/uL — ABNORMAL LOW (ref 150–400)
RBC: 5.06 MIL/uL (ref 4.22–5.81)
RDW: 15.6 % — ABNORMAL HIGH (ref 11.5–15.5)
WBC: 4.2 10*3/uL (ref 4.0–10.5)
nRBC: 0 % (ref 0.0–0.2)

## 2022-10-18 LAB — GLUCOSE, CAPILLARY: Glucose-Capillary: 102 mg/dL — ABNORMAL HIGH (ref 70–99)

## 2022-10-18 MED ORDER — DIVALPROEX SODIUM 250 MG PO DR TAB: 750.0000 mg | DELAYED_RELEASE_TABLET | Freq: Two times a day (BID) | ORAL | 2 refills | Status: DC

## 2022-10-18 NOTE — Discharge Summary (Signed)
Name: Seth Brock MRN: [id number] DOB: Jul 14, 1961 61 y.o. PCP: Denny Levy  Date of Admission: 10/11/2022  5:50 PM Date of Discharge: 10/18/2022 Attending Physician: Earl Lagos, MD  Discharge Diagnosis: 1. Principal Problem:   Seizure (HCC) Active Problems:   Leukocytopenia   Benign essential hypertension   Atrial fibrillation and flutter (HCC)   Thrombocytopenia, unspecified (HCC)   Hyperlipidemia   Intellectual disability   Influenza A   Hypoglycemia   Discharge Medications: Allergies as of 10/18/2022   No Known Allergies      Medication List     STOP taking these medications    divalproex 500 MG 24 hr tablet Commonly known as: DEPAKOTE ER Replaced by: divalproex 250 MG DR tablet       TAKE these medications    atorvastatin 20 MG tablet Commonly known as: LIPITOR TAKE 1 TABLET BY MOUTH EVERY DAY   diltiazem 240 MG 24 hr capsule Commonly known as: CARDIZEM CD Take 1 capsule (240 mg total) by mouth daily.   divalproex 250 MG DR tablet Commonly known as: DEPAKOTE Take 3 tablets (750 mg total) by mouth every 12 (twelve) hours. Replaces: divalproex 500 MG 24 hr tablet   Lacosamide 150 MG Tabs TAKE 2 TABLETS BY MOUTH 2 TIMES DAILY. What changed: when to take this   levETIRAcetam 1000 MG tablet Commonly known as: KEPPRA Take 2 tablets (2,000 mg total) by mouth 2 (two) times daily.        Disposition and follow-up:   Mr.Aloysuis Fooshee was discharged from [facility] in Stable condition.  At the hospital follow up visit please address:  1.  Seizure, AMS: No epileptic activity since admission. Discharge with depakote 750 mg twice daily, Keppra 2 g bid, and vimpat 300 mg twice daily. Please followup to ensure continued absence of seizures. Patient is to follow up with neurology 11/05/2022.  Influenza A: Asymptomatic day of discharge. Completed course of tamiflu.   Acute on Chronic Leukocytopenia, thrombocytopenia:  Chronic thrombocytopenia likely in the setting of chronic anti seizure and rate control medications. However, recent acute drop likely 2/2 viral illness vs EtOH use. Outpatient follow up with hematology with Dr. Candise Che or Dr. Leonides Schanz for further work up. Please continue to monitor for signs of bleeding and ensure proper follow up materializes. F/u with CBC.  Atrial flutter: NSR this admission. Discharged with Diltiazem 240 mg daily.   2.  Labs / imaging needed at time of follow-up: CBC  3.  Pending labs/ test needing follow-up: none  Follow-up Appointments:   Hospital Course by problem list: 1. Patient presented with AMS with reports of multiple seizure episodes in the past week at boarding home. On admission, found to be hypoglycemic, elevated Depakote levels, CK elevated to 447,  U/A with +ketones on admission, likely in the setting of seizure activity and starvation ketosis. Cr at baseline. CXR, CT head normal. Neurology evaluated on 12/23, and recommended continuation of anti epileptics, though decrease Depakote given recent increased level. Completed a course of Klonopin. Mentation returned to baseline and patient has been without seizure activity since admission. Patient to discharge with Depakote, Keppra, Vimpat and follow up with neurology on 11/05/2022.  Discharge Exam:   BP 105/75   Pulse 81   Temp 98.1 F (36.7 C) (Oral)   Resp 16   Ht 6\' 2"  (1.88 m)   Wt 79.4 kg   SpO2 99%   BMI 22.47 kg/m  Discharge exam:   Physical Exam Constitutional:  General: He is not in acute distress. HENT:     Head: Normocephalic and atraumatic.  Eyes:     Extraocular Movements: Extraocular movements intact.     Conjunctiva/sclera: Conjunctivae normal.  Cardiovascular:     Rate and Rhythm: Normal rate and regular rhythm.  Pulmonary:     Effort: Pulmonary effort is normal.     Breath sounds: Normal breath sounds.  Skin:    General: Skin is warm and dry.  Neurological:     General: No  focal deficit present.     Mental Status: He is alert and oriented to person, place, and time.  Psychiatric:        Mood and Affect: Mood normal.        Behavior: Behavior normal.      Pertinent Labs, Studies, and Procedures:      Latest Ref Rng & Units 10/18/2022    1:08 AM 10/17/2022    4:27 AM 10/17/2022    4:26 AM  CBC  WBC 4.0 - 10.5 K/uL 4.2  3.7  3.8   Hemoglobin 13.0 - 17.0 g/dL 41.9  62.2  29.7   Hematocrit 39.0 - 52.0 % 38.7  36.8  37.2   Platelets 150 - 400 K/uL 123  133  134       Latest Ref Rng & Units 10/17/2022    4:27 AM 10/16/2022    1:33 AM 10/15/2022    7:17 AM  CMP  Glucose 70 - 99 mg/dL 90  989  211   BUN 6 - 20 mg/dL 14  16  16    Creatinine 0.61 - 1.24 mg/dL  9.41  7.40   Sodium 135 - 145 mmol/L 138  137  137   Potassium 3.5 - 5.1 mmol/L 4.3  4.6  4.1   Chloride 98 - 111 mmol/L 107  104  104   CO2 22 - 32 mmol/L 26  26  27    Calcium 8.9 - 10.3 mg/dL 8.14   48.1     CT Head Wo Contrast  Result Date: 10/12/2022 CLINICAL DATA:  Altered mental status. EXAM: CT HEAD WITHOUT CONTRAST TECHNIQUE: Contiguous axial images were obtained from the base of the skull through the vertex without intravenous contrast. RADIATION DOSE REDUCTION: This exam was performed according to the departmental dose-optimization program which includes automated exposure control, adjustment of the mA and/or kV according to patient size and/or use of iterative reconstruction technique. COMPARISON:  Head CT dated 05/26/2021. FINDINGS: Brain: The ventricles and sulci appropriate size for patient's age. Mild periventricular and deep Nasteho Glantz matter chronic microvascular ischemic changes noted. There is no acute intracranial hemorrhage. No mass effect or midline shift. No extra-axial fluid collection. Vascular: No hyperdense vessel or unexpected calcification. Skull: Normal. Negative for fracture or focal lesion. Sinuses/Orbits: Mild mucoperiosteal thickening of paranasal sinuses. No  air-fluid level. The mastoid air cells are clear. Other: None IMPRESSION: 1. No acute intracranial pathology. 2. Mild chronic microvascular ischemic changes. Electronically Signed   By: 10/14/2022 M.D.   On: 10/12/2022 01:18   DG Chest Port 1 View  Result Date: 10/11/2022 CLINICAL DATA:  Altered mental status EXAM: PORTABLE CHEST 1 VIEW COMPARISON:  Radiographs 05/07/2017 FINDINGS: The heart size and mediastinal contours are within normal limits. Both lungs are clear. The visualized skeletal structures are unremarkable. Left chest wall neurostimulator. IMPRESSION: No active disease. Electronically Signed   By: 10/13/2022 M.D.   On: 10/11/2022 18:53     Discharge Instructions: Discharge Instructions  Diet - low sodium heart healthy   Complete by: As directed    Discharge instructions   Complete by: As directed    Mr. Bhat,  It was a pleasure caring for you during your admission here at [facility]. You can in after having seizures and were also found to have the flu. Your symptoms have resolved and you are no longer having seizures. -please schedule an appointment with your primary care physician in the next week or so -please go to your neurology appointment on 11/05/2022 -please take depakote 750 mg twice daily, otherwise take your medications as before   No wound care   Complete by: As directed        Signed: Linward Natal, MD 10/18/2022, 1:28 PM   Pager: [phone]

## 2022-10-18 NOTE — Progress Notes (Signed)
Called report to [phone] and spoke with India, Charity fundraiser.  She is the RN taking the patient.  Patient will be going to Room 110-B.

## 2022-10-18 NOTE — TOC Transition Note (Signed)
Transition of Care [facility]) - CM/SW Discharge Note   Patient Details  Name: Seth Brock MRN: [id number] Date of Birth: May 13, 1961  Transition of Care [facility]) CM/SW Contact:  Mearl Latin, LCSW Phone Number: 10/18/2022, 4:04 PM   Clinical Narrative:    Patient will DC to: [name] Anticipated DC date: 10/18/22 Family notified: Zoe Lan (left vm for sister, no return calls. Bette stated she and her husband help patient) Transport by: Sharin Mons   Per MD patient ready for DC to [facility]. RN to call report prior to discharge [phone]). RN, patient, patient's family, and facility notified of DC. Discharge Summary and FL2 sent to facility. DC packet on chart. Ambulance transport requested for patient.   CSW will sign off for now as social work intervention is no longer needed. Please consult [place] again if new needs arise.     Final next level of care: Skilled Nursing Facility Barriers to Discharge: Barriers Resolved   Patient Goals and CMS Choice CMS Medicare.gov Compare Post Acute Care list provided to:: Patient Represenative (must comment) Choice offered to / list presented to :  Zoe Lan)  Discharge Placement PASRR number recieved: 10/17/22 PASRR number recieved: 10/17/22            Patient chooses bed at:  [facility]) Patient to be transferred to facility by: [facility] Name of family member notified: Zoe Lan Patient and family notified of of transfer: 10/18/22  Discharge Plan and Services Additional resources added to the After Visit Summary for   In-house Referral: Clinical Social Work                                   Social Determinants of Health (SDOH) Interventions SDOH Screenings   Food Insecurity: Food Insecurity Present (10/12/2022)  Housing: Medium Risk (10/12/2022)  Transportation Needs: No Transportation Needs (10/12/2022)  Utilities: Not At Risk (10/12/2022)  Depression (PHQ2-9): Low Risk  (09/27/2021)  Tobacco Use: High  Risk (10/11/2022)     Readmission Risk Interventions     No data to display

## 2022-10-18 NOTE — Progress Notes (Signed)
Occupational Therapy Treatment Patient Details Name: Seth Brock MRN: [id number] DOB: Feb 25, 1961 Today's Date: 10/18/2022   History of present illness Patient is a 61 y/o male who presents on 12/22 with confusion, unsteady gait and seizures. Found to have recurrent breakthrough seizures and + Flu A. PMH includes intellectual disability, CAD, Epilepsy seizures, A-fib, HTN.   OT comments  Focus session on safety with mobility and following commands. Pt oriented this session. Following all one step commands with increased time and intermittent min cues. Pt requiring intermittent cues for hand placement during transfers and observed to repeat. Pt aware of use of call bell before attempting out of chair. Pt continues to beneift from min guard during all standing tasks.   Recommendations for follow up therapy are one component of a multi-disciplinary discharge planning process, led by the attending physician.  Recommendations may be updated based on patient status, additional functional criteria and insurance authorization.    Follow Up Recommendations  Skilled nursing-short term rehab (<3 hours/day)     Assistance Recommended at Discharge Frequent or constant Supervision/Assistance  Patient can return home with the following  A little help with walking and/or transfers;A lot of help with bathing/dressing/bathroom;Assistance with cooking/housework;Direct supervision/assist for medications management;Direct supervision/assist for financial management;Assist for transportation;Help with stairs or ramp for entrance   Equipment Recommendations  Other (comment) (TBD next venue; also unsure what was available at group home)    Recommendations for Other Services      Precautions / Restrictions Precautions Precautions: Fall Precaution Comments: watch 02 Restrictions Weight Bearing Restrictions: No       Mobility Bed Mobility Overal bed mobility: Needs Assistance Bed Mobility: Supine to Sit      Supine to sit: Min assist     General bed mobility comments: MIn A due to poor core strength and initially experiencing posterior LOB    Transfers Overall transfer level: Needs assistance Equipment used: Rolling walker (2 wheels), 1 person hand held assist Transfers: Sit to/from Stand Sit to Stand: Min guard           General transfer comment: Min gaurd A for safety. Cues for hand placement     Balance Overall balance assessment: Needs assistance Sitting-balance support: Feet supported Sitting balance-Leahy Scale: Fair     Standing balance support: Bilateral upper extremity supported, During functional activity Standing balance-Leahy Scale: Poor Standing balance comment: RW and min guard A                           ADL either performed or assessed with clinical judgement   ADL Overall ADL's : Needs assistance/impaired     Grooming: Min guard;Standing;Wash/dry face Grooming Details (indicate cue type and reason): at sink with min guard A Upper Body Bathing: Set up;Sitting Upper Body Bathing Details (indicate cue type and reason): EOB due to crumbs on chest             Toilet Transfer: Min guard;Rolling walker (2 wheels) Toilet Transfer Details (indicate cue type and reason): Min guard A for safety         Functional mobility during ADLs: Min guard;Rolling walker (2 wheels) General ADL Comments: Pt limited by generalized weakness, perseveration on topics. Pt with greater safety awareness, placing hands in correct position during transfers with one command.    Extremity/Trunk Assessment Upper Extremity Assessment Upper Extremity Assessment: Generalized weakness   Lower Extremity Assessment Lower Extremity Assessment: Defer to PT evaluation  Vision   Additional Comments: Pt copntinues to deny visual changes   Perception     Praxis      Cognition Arousal/Alertness: Awake/alert Behavior During Therapy: Impulsive Overall  Cognitive Status: History of cognitive impairments - at baseline Area of Impairment: Problem solving, Awareness, Safety/judgement, Following commands, Attention                   Current Attention Level: Selective   Following Commands: Follows one step commands with increased time Safety/Judgement: Decreased awareness of deficits Awareness: Intellectual Problem Solving: Requires verbal cues, Requires tactile cues, Slow processing General Comments: repeated quetions and comments. Better ability to follow commands this session with min cues.        Exercises      Shoulder Instructions       General Comments internally distratced and perseverating on walking and television    Pertinent Vitals/ Pain       Pain Assessment Pain Assessment: 0-10  Home Living                                          Prior Functioning/Environment              Frequency  Min 2X/week        Progress Toward Goals  OT Goals(current goals can now be found in the care plan section)  Progress towards OT goals: Progressing toward goals  Acute Rehab OT Goals Patient Stated Goal: watch TV OT Goal Formulation: With patient Time For Goal Achievement: 10/27/22 Potential to Achieve Goals: Fair ADL Goals Pt Will Perform Grooming: standing;with supervision Pt Will Perform Upper Body Dressing: with modified independence;sitting Pt Will Perform Lower Body Dressing: sit to/from stand;with set-up Pt Will Transfer to Toilet: with modified independence;ambulating Additional ADL Goal #1: Pt will follow all one step commands with 100% accuracy and no verbal cues.  Plan Discharge plan remains appropriate;Frequency remains appropriate    Co-evaluation                 AM-PAC OT "6 Clicks" Daily Activity     Outcome Measure   Help from another person eating meals?: A Little Help from another person taking care of personal grooming?: A Little Help from another person  toileting, which includes using toliet, bedpan, or urinal?: A Little Help from another person bathing (including washing, rinsing, drying)?: A Little Help from another person to put on and taking off regular upper body clothing?: A Little Help from another person to put on and taking off regular lower body clothing?: A Little 6 Click Score: 18    End of Session Equipment Utilized During Treatment: Gait belt;Rolling walker (2 wheels)  OT Visit Diagnosis: Unsteadiness on feet (R26.81);Other abnormalities of gait and mobility (R26.89);Muscle weakness (generalized) (M62.81);Other symptoms and signs involving cognitive function   Activity Tolerance Patient tolerated treatment well   Patient Left in chair;with call bell/phone within reach;with chair alarm set   Nurse Communication Mobility status        Time: 6387-5643 OT Time Calculation (min): 18 min  Charges: OT General Charges $OT Visit: 1 Visit OT Treatments $Self Care/Home Management : 8-22 mins  Tyler Deis, OTR/L [facility] Acute Rehabilitation Office: [phone]    Myrla Halsted 10/18/2022, 5:30 PM

## 2022-10-18 NOTE — Progress Notes (Addendum)
Hospital day#5 Subjective:   Summary: Seth Brock is a 61 y/o male with history of intellectual disability, seizure disorder, HTN, CAD, and HLD that presents for acute altered mental status and admitted for further evaluation of his mental status and seizures.   Overnight Events: NAEO.  Patient states he feels great today and is ready to leave so he can go "walk around [place]." Denies any cough, shortness of breath, chest pain. Denies any seizure-activity, loss of bowel or urine continence.   Objective:  Vital signs in last 24 hours: Vitals:   10/18/22 0753 10/18/22 0754 10/18/22 0755 10/18/22 0800  BP:    105/75  Pulse:  82 81 81  Resp: 13 18 (!) 21 16  Temp:    98.1 F (36.7 C)  TempSrc:    Oral  SpO2:  97% 98% 99%  Weight:      Height:       Supplemental O2: Room Air SpO2: 99 % Filed Weights   10/11/22 1802 10/15/22 0500 10/16/22 0500  Weight: 85.3 kg 77.9 kg 79.4 kg    Physical Exam:  Constitutional: well appearing man sitting in bed, in no acute distress HENT: normocephalic atraumatic, moist mucous membranes Neck: supple Cardiovascular: Regular rate, irregular rhythm, no murmurs Pulmonary/Chest: normal work of breathing on room air, lungs CTAB MSK: normal bulk and tone. Moving all extremities.  Neurological: A&O x 3. Skin: warm and dry.  Psych: Pleasant mood and affect    Intake/Output Summary (Last 24 hours) at 10/18/2022 1207 Last data filed at 10/17/2022 1532 Gross per 24 hour  Intake --  Output 1000 ml  Net -1000 ml     Net IO Since Admission: -2,650 mL [10/18/22 1207]  Pertinent Labs:    Latest Ref Rng & Units 10/18/2022    1:08 AM 10/17/2022    4:27 AM 10/17/2022    4:26 AM  CBC  WBC 4.0 - 10.5 K/uL 4.2  3.7  3.8   Hemoglobin 13.0 - 17.0 g/dL 76.1  60.7  37.1   Hematocrit 39.0 - 52.0 % 38.7  36.8  37.2   Platelets 150 - 400 K/uL 123  133  134        Latest Ref Rng & Units 10/17/2022    4:27 AM 10/16/2022    1:33 AM  10/15/2022    7:17 AM  CMP  Glucose 70 - 99 mg/dL 90  062  694   BUN 6 - 20 mg/dL 14  16  16    Creatinine 0.61 - 1.24 mg/dL  8.54  6.27   Sodium 135 - 145 mmol/L 138  137  137   Potassium 3.5 - 5.1 mmol/L 4.3  4.6  4.1   Chloride 98 - 111 mmol/L 107  104  104   CO2 22 - 32 mmol/L 26  26  27    Calcium 8.9 - 10.3 mg/dL 0.35   00.9     Imaging: No results found.  Assessment/Plan:   Principal Problem:   Seizure (HCC) Active Problems:   Leukocytopenia   Benign essential hypertension   Atrial fibrillation and flutter (HCC)   Thrombocytopenia, unspecified (HCC)   Hyperlipidemia   Intellectual disability   Influenza A   Hypoglycemia   Patient Summary: Govanni Plemons is a 61 y/o male with history of intellectual disability, seizure disorder, HTN, CAD, and HLD that presents for acute altered mental status 2/2 seizures vs EtOH use vs acute viral illness, now resolved.  Seizure Intellectual Disability Influenza  A, improving AMS, resolved Patient presenting with AMS with reports of multiple seizure episodes in the past week at boarding home. Hypoglycemic, elevated Depakote levels, CK elevated to 447,  U/A with +ketones on admission, likely in the setting of seizure activity and starvation ketosis. Cr at baseline. CXR, CT head normal. Neurology evaluated on 12/23, and recommended continuation of anti epileptics, though decrease Depakote given recent increased level. Completed a course of Klonopin. Mentation at baseline. Without seizure activity since admission. He is medically stable for discharge to SNF. -Osetalmivir 75 mg for 5 days (12/24-28) -Decrease Depakote to 750 twice daily (home dose 1 g twice daily) (12-24- -Continue Keppra 2 g twice daily -Continue Vimpat 300 mg twice daily -Completed Klonopin 0.5 mg twice daily, completed (10-14-11/25)  Acute on Chronic Leukocytopenia, improving Acute on Chronic Thrombocytopenia, improving Continues to be asymptomatic, no  bleeding, or petechiae or purpura on exam. WBC today 3.8, PLT 134k today, highest during this hospitalization. Leukopenia documented since 2013. Chronic intermittent thrombocytopenia, though worse this admission. Peripheral smear with normal morphology, thrombocytopenia, and increased left shift. Immature platelet function, 12.5. Normal d-dimer and mildly elevated fibrinogen. Normal PT/INR/PTT. Folate is normal but B12 is markedly elevated.  Platelet function assay elevated; discussed with Dr. Arbutus Ped from Hematology. Chronic thrombocytopenia likely in the setting of chronic anti seizure and rate control medications. However, recent acute drop likely 2/2 viral illness vs EtOH use.  -Continue to monitor for signs of bleeding -Outpatient follow up with hematology with Dr. Candise Che or Dr. Leonides Schanz for further work up -Repeat CBC at [facility] after resolution of viral illness  Atrial flutter Patient has history of persistent atrial flutter. Previously not felt appropriate for [facility] given seizures and fall risk. Patient with asymptomatic bradycardia overnight. Rate in 70s and 80s today. Will transition to short acting diltiazem if continues with bradycardic episodes.  - Continue Diltiazem 240 mg daily - Ordered telemetry  Diet: Normal VTE: Enoxaparin Code: Full PT/OT recs: Short term SNF for rehab; bed available at [facility], awaiting insurance approval. Jinny Sanders has been notified.   Dispo: Anticipated discharge to SNF for short term rehab in 1-2days pending insurance authorization  Adron Bene, MD Internal Medicine Resident PGY-1 Please contact the on call pager after 5 pm and on weekends at [phone].

## 2022-10-18 NOTE — Plan of Care (Signed)

## 2022-10-21 DIAGNOSIS — I10 Essential (primary) hypertension: Secondary | ICD-10-CM | POA: Diagnosis not present

## 2022-10-22 ENCOUNTER — Telehealth: Payer: Self-pay | Admitting: Hematology

## 2022-10-22 ENCOUNTER — Emergency Department (HOSPITAL_COMMUNITY)
Admission: EM | Admit: 2022-10-22 | Discharge: 2022-10-23 | Disposition: A | Discharge status: Skilled Nursing Facility | Payer: Medicaid Other | Attending: Emergency Medicine | Admitting: Emergency Medicine

## 2022-10-22 DIAGNOSIS — G40909 Epilepsy, unspecified, not intractable, without status epilepticus: Secondary | ICD-10-CM

## 2022-10-22 DIAGNOSIS — R569 Unspecified convulsions: Secondary | ICD-10-CM | POA: Diagnosis not present

## 2022-10-22 DIAGNOSIS — Z79899 Other long term (current) drug therapy: Secondary | ICD-10-CM | POA: Insufficient documentation

## 2022-10-22 LAB — CBC
HCT: 35.5 % — ABNORMAL LOW (ref 39.0–52.0)
Hemoglobin: 11.4 g/dL — ABNORMAL LOW (ref 13.0–17.0)
MCH: 24.6 pg — ABNORMAL LOW (ref 26.0–34.0)
MCHC: 32.1 g/dL (ref 30.0–36.0)
MCV: 76.5 fL — ABNORMAL LOW (ref 80.0–100.0)
Platelets: 207 10*3/uL (ref 150–400)
RBC: 4.64 MIL/uL (ref 4.22–5.81)
RDW: 15.9 % — ABNORMAL HIGH (ref 11.5–15.5)
WBC: 5.2 10*3/uL (ref 4.0–10.5)
nRBC: 0 % (ref 0.0–0.2)

## 2022-10-22 LAB — BASIC METABOLIC PANEL
Anion gap: 6 (ref 5–15)
BUN: 13 mg/dL (ref 6–20)
CO2: 25 mmol/L (ref 22–32)
Calcium: 9.9 mg/dL (ref 8.9–10.3)
Chloride: 106 mmol/L (ref 98–111)
Creatinine, Ser: 0.82 mg/dL (ref 0.61–1.24)
GFR, Estimated: >60 mL/min (ref >60)
Glucose, Bld: 97 mg/dL (ref 70–99)
Potassium: 4.3 mmol/L (ref 3.5–5.1)
Sodium: 137 mmol/L (ref 135–145)

## 2022-10-22 LAB — VALPROIC ACID LEVEL: Valproic Acid Lvl: 22 ug/mL — ABNORMAL LOW (ref 50.0–100.0)

## 2022-10-22 LAB — ETHANOL: Alcohol, Ethyl (B): <10 mg/dL (ref <10)

## 2022-10-22 NOTE — ED Provider Notes (Signed)
[facility] DEPT Provider Note   CSN: [id number] Arrival date & time: 10/22/22  1904     History  Chief complaint: Seizure  Seth Brock is a 62 y.o. male.  HPI   Patient has history of seizures, hypertension, cognitive impairment who presents to the ED for evaluation of a seizure.  Patient was recently admitted to the hospital on December 22.  He was discharged on December 30.  At that time the patient was admitted to the hospital for multiple seizures in the setting of influenza A and elevated Depakote level.  Patient ended up having his Depakote decreased while continuing his Keppra and lacosamide.  Patient did not have any recurrent seizures while in the hospital.  Patient resides in a group home.  Cording to EMS report he had a seizure this evening while in the shower.  Patient denies any complaints.  He is not having any trouble with chest pain.  No cough.  No vomiting or diarrhea.  He states he feels fine now.  Home Medications Prior to Admission medications   Medication Sig Start Date End Date Taking? Authorizing Provider  atorvastatin (LIPITOR) 20 MG tablet TAKE 1 TABLET BY MOUTH EVERY DAY Patient taking differently: Take 20 mg by mouth daily. 07/08/22   Camillia Herter, NP  diltiazem (CARDIZEM CD) 240 MG 24 hr capsule Take 1 capsule (240 mg total) by mouth daily. 04/26/22   Nahser, Wonda Cheng, MD  divalproex (DEPAKOTE) 250 MG DR tablet Take 3 tablets (750 mg total) by mouth every 12 (twelve) hours. 10/18/22 01/16/23  Linward Natal, MD  Lacosamide 150 MG TABS TAKE 2 TABLETS BY MOUTH 2 TIMES DAILY. Patient taking differently: Take 300 mg by mouth in the morning and at bedtime. 04/18/22   Marcial Pacas, MD  levETIRAcetam (KEPPRA) 1000 MG tablet Take 2 tablets (2,000 mg total) by mouth 2 (two) times daily. 11/06/21   Suzzanne Cloud, NP      Allergies    Patient has no known allergies.    Review of Systems   Review of Systems  Physical Exam Updated Vital  Signs BP 139/89 (BP Location: Right Arm)   Pulse 91   Temp 99 F (37.2 C) (Oral)   Resp 18   SpO2 100%  Physical Exam Vitals and nursing note reviewed.  Constitutional:      General: He is not in acute distress.    Appearance: He is well-developed.  HENT:     Head: Normocephalic and atraumatic.     Right Ear: External ear normal.     Left Ear: External ear normal.  Eyes:     General: No scleral icterus.       Right eye: No discharge.        Left eye: No discharge.     Conjunctiva/sclera: Conjunctivae normal.  Neck:     Trachea: No tracheal deviation.  Cardiovascular:     Rate and Rhythm: Normal rate and regular rhythm.  Pulmonary:     Effort: Pulmonary effort is normal. No respiratory distress.     Breath sounds: Normal breath sounds. No stridor. No wheezing or rales.  Abdominal:     General: Bowel sounds are normal. There is no distension.     Palpations: Abdomen is soft.     Tenderness: There is no abdominal tenderness. There is no guarding or rebound.  Musculoskeletal:        General: No swelling, tenderness or deformity.     Cervical back: Neck supple.  Skin:    General: Skin is warm and dry.     Findings: No rash.  Neurological:     General: No focal deficit present.     Mental Status: He is alert. Mental status is at baseline.     Cranial Nerves: No cranial nerve deficit, dysarthria or facial asymmetry.     Sensory: No sensory deficit.     Motor: No weakness, abnormal muscle tone or seizure activity.     Coordination: Coordination normal.  Psychiatric:        Mood and Affect: Mood normal.     ED Results / Procedures / Treatments   Labs (all labs ordered are listed, but only abnormal results are displayed) Labs Reviewed  VALPROIC ACID LEVEL - Abnormal; Notable for the following components:      Result Value   Valproic Acid Lvl 22 (*)    All other components within normal limits  CBC - Abnormal; Notable for the following components:   Hemoglobin 11.4 (*)     HCT 35.5 (*)    MCV 76.5 (*)    MCH 24.6 (*)    RDW 15.9 (*)    All other components within normal limits  BASIC METABOLIC PANEL  ETHANOL  LEVETIRACETAM LEVEL  RAPID URINE DRUG SCREEN, HOSP PERFORMED    EKG EKG Interpretation  Date/Time:  Tuesday October 22 2022 22:57:07 EST Ventricular Rate:  47 PR Interval:    QRS Duration: 114 QT Interval:  410 QTC Calculation: 362 R Axis:   63 Text Interpretation: Atrial flutter Abnormal ECG When compared with ECG of 16-Oct-2022 15:56, No significant change since last tracing Confirmed by Dorie Rank [phone]) on 10/22/2022 11:00:18 PM  Radiology No results found.  Procedures Procedures    Medications Ordered in ED Medications - No data to display  ED Course/ Medical Decision Making/ A&P Clinical Course as of 10/22/22 2301  Tue Oct 22, 2022  2251 Valproic acid level(!) Valproic acid level decreased compared to previous [JK]  2251 CBC(!) Hemoglobin slightly decreased, [JK]  2251 Ethanol Negative [JK]  2252 Patient has remained stable in the ED.  No recurrent seizures [JK]    Clinical Course User Index [JK] Dorie Rank, MD                           Medical Decision Making Problems Addressed: Seizure disorder [facility]): chronic illness or injury with exacerbation, progression, or side effects of treatment  Amount and/or Complexity of Data Reviewed Labs: ordered. Decision-making details documented in ED Course.   Patient presented to the ED for evaluation of a seizure.  Patient does have history of same.  Recently in the hospital for similar issues.  Patient monitored in the ED no recurrent seizures.  Patient is at his baseline.  No significant laboratory abnormalities. Will have pt continue home regimen, follow up with his neurologist.    Evaluation and diagnostic testing in the emergency department does not suggest an emergent condition requiring admission or immediate intervention beyond what has been performed at this time.   The patient is safe for discharge and has been instructed to return immediately for worsening symptoms, change in symptoms or any other concerns.         Final Clinical Impression(s) / ED Diagnoses Final diagnoses:  Seizure disorder [facility])    Rx / DC Orders ED Discharge Orders     None         Dorie Rank, MD 10/22/22 2301

## 2022-10-22 NOTE — ED Triage Notes (Signed)
Pt arrives from [facility]. Had a seizure in the shower. Alert to baseline.

## 2022-10-22 NOTE — Telephone Encounter (Signed)
Contacted patient to scheduled appointments. Left message with appointment details and a call back number if patient had any questions or could not accommodate the time we provided.   

## 2022-10-22 NOTE — Discharge Instructions (Signed)
Your test today were reassuring.  Continue your current medications.  Follow-up with your doctor to be rechecked

## 2022-10-23 DIAGNOSIS — R569 Unspecified convulsions: Secondary | ICD-10-CM | POA: Diagnosis not present

## 2022-10-23 DIAGNOSIS — I10 Essential (primary) hypertension: Secondary | ICD-10-CM | POA: Diagnosis not present

## 2022-10-23 DIAGNOSIS — D649 Anemia, unspecified: Secondary | ICD-10-CM | POA: Diagnosis not present

## 2022-10-23 DIAGNOSIS — E785 Hyperlipidemia, unspecified: Secondary | ICD-10-CM | POA: Diagnosis not present

## 2022-10-23 DIAGNOSIS — I4892 Unspecified atrial flutter: Secondary | ICD-10-CM | POA: Diagnosis not present

## 2022-10-23 DIAGNOSIS — D696 Thrombocytopenia, unspecified: Secondary | ICD-10-CM | POA: Diagnosis not present

## 2022-10-23 DIAGNOSIS — D72819 Decreased white blood cell count, unspecified: Secondary | ICD-10-CM | POA: Diagnosis not present

## 2022-10-23 DIAGNOSIS — I4891 Unspecified atrial fibrillation: Secondary | ICD-10-CM | POA: Diagnosis not present

## 2022-10-24 DIAGNOSIS — I4892 Unspecified atrial flutter: Secondary | ICD-10-CM | POA: Diagnosis not present

## 2022-10-24 DIAGNOSIS — D649 Anemia, unspecified: Secondary | ICD-10-CM | POA: Diagnosis not present

## 2022-10-24 DIAGNOSIS — I4891 Unspecified atrial fibrillation: Secondary | ICD-10-CM | POA: Diagnosis not present

## 2022-10-24 DIAGNOSIS — R569 Unspecified convulsions: Secondary | ICD-10-CM | POA: Diagnosis not present

## 2022-10-24 DIAGNOSIS — E785 Hyperlipidemia, unspecified: Secondary | ICD-10-CM | POA: Diagnosis not present

## 2022-10-24 DIAGNOSIS — D696 Thrombocytopenia, unspecified: Secondary | ICD-10-CM | POA: Diagnosis not present

## 2022-10-24 DIAGNOSIS — I10 Essential (primary) hypertension: Secondary | ICD-10-CM | POA: Diagnosis not present

## 2022-10-24 LAB — LEVETIRACETAM LEVEL: Levetiracetam Lvl: 49 ug/mL — ABNORMAL HIGH (ref 10.0–40.0)

## 2022-11-01 DIAGNOSIS — I10 Essential (primary) hypertension: Secondary | ICD-10-CM | POA: Diagnosis not present

## 2022-11-01 DIAGNOSIS — F79 Unspecified intellectual disabilities: Secondary | ICD-10-CM | POA: Diagnosis not present

## 2022-11-01 DIAGNOSIS — D696 Thrombocytopenia, unspecified: Secondary | ICD-10-CM | POA: Diagnosis not present

## 2022-11-01 DIAGNOSIS — D649 Anemia, unspecified: Secondary | ICD-10-CM | POA: Diagnosis not present

## 2022-11-01 DIAGNOSIS — I4891 Unspecified atrial fibrillation: Secondary | ICD-10-CM | POA: Diagnosis not present

## 2022-11-01 DIAGNOSIS — E785 Hyperlipidemia, unspecified: Secondary | ICD-10-CM | POA: Diagnosis not present

## 2022-11-01 DIAGNOSIS — I4892 Unspecified atrial flutter: Secondary | ICD-10-CM | POA: Diagnosis not present

## 2022-11-05 ENCOUNTER — Encounter: Payer: Self-pay | Admitting: Neurology

## 2022-11-05 ENCOUNTER — Ambulatory Visit (INDEPENDENT_AMBULATORY_CARE_PROVIDER_SITE_OTHER): Payer: Medicaid Other | Admitting: Neurology

## 2022-11-05 VITALS — BP 107/70 | HR 84 | Ht 74.0 in | Wt 174.0 lb

## 2022-11-05 DIAGNOSIS — G40209 Localization-related (focal) (partial) symptomatic epilepsy and epileptic syndromes with complex partial seizures, not intractable, without status epilepticus: Secondary | ICD-10-CM | POA: Diagnosis not present

## 2022-11-05 DIAGNOSIS — Z9689 Presence of other specified functional implants: Secondary | ICD-10-CM

## 2022-11-05 DIAGNOSIS — G40219 Localization-related (focal) (partial) symptomatic epilepsy and epileptic syndromes with complex partial seizures, intractable, without status epilepticus: Secondary | ICD-10-CM | POA: Diagnosis not present

## 2022-11-05 MED ORDER — LEVETIRACETAM 1000 MG PO TABS: 2000.0000 mg | ORAL_TABLET | Freq: Two times a day (BID) | ORAL | 4 refills | Status: DC

## 2022-11-05 MED ORDER — DIVALPROEX SODIUM 500 MG PO DR TAB: 1000.0000 mg | DELAYED_RELEASE_TABLET | Freq: Two times a day (BID) | ORAL | 11 refills | Status: DC

## 2022-11-05 NOTE — Patient Instructions (Signed)
Increase Depakote DR to 500 mg, 2 tablets twice daily Continue current dosing of Vimpat and Keppra  Check CBC, liver function today  See you back in 6 months
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
## 2022-11-06 ENCOUNTER — Telehealth: Payer: Self-pay

## 2022-11-06 DIAGNOSIS — E785 Hyperlipidemia, unspecified: Secondary | ICD-10-CM | POA: Diagnosis not present

## 2022-11-06 DIAGNOSIS — I4892 Unspecified atrial flutter: Secondary | ICD-10-CM | POA: Diagnosis not present

## 2022-11-06 DIAGNOSIS — D649 Anemia, unspecified: Secondary | ICD-10-CM | POA: Diagnosis not present

## 2022-11-06 DIAGNOSIS — R569 Unspecified convulsions: Secondary | ICD-10-CM | POA: Diagnosis not present

## 2022-11-06 DIAGNOSIS — I10 Essential (primary) hypertension: Secondary | ICD-10-CM | POA: Diagnosis not present

## 2022-11-06 LAB — CBC WITH DIFFERENTIAL/PLATELET
Basophils Absolute: 0 10*3/uL (ref 0.0–0.2)
Basos: 1 %
EOS (ABSOLUTE): 0.1 10*3/uL (ref 0.0–0.4)
Eos: 3 %
Hematocrit: 39.1 % (ref 37.5–51.0)
Hemoglobin: 12.6 g/dL — ABNORMAL LOW (ref 13.0–17.7)
Immature Grans (Abs): 0 10*3/uL (ref 0.0–0.1)
Immature Granulocytes: 0 %
Lymphocytes Absolute: 1.7 10*3/uL (ref 0.7–3.1)
Lymphs: 54 %
MCH: 25 pg — ABNORMAL LOW (ref 26.6–33.0)
MCHC: 32.2 g/dL (ref 31.5–35.7)
MCV: 78 fL — ABNORMAL LOW (ref 79–97)
Monocytes Absolute: 0.3 10*3/uL (ref 0.1–0.9)
Monocytes: 8 %
Neutrophils Absolute: 1.1 10*3/uL — ABNORMAL LOW (ref 1.4–7.0)
Neutrophils: 34 %
Platelets: 111 10*3/uL — ABNORMAL LOW (ref 150–450)
RBC: 5.04 x10E6/uL (ref 4.14–5.80)
RDW: 16.6 % — ABNORMAL HIGH (ref 11.6–15.4)
WBC: 3.2 10*3/uL — ABNORMAL LOW (ref 3.4–10.8)

## 2022-11-06 LAB — HEPATIC FUNCTION PANEL
ALT: 12 IU/L (ref 0–44)
AST: 17 IU/L (ref 0–40)
Albumin: 3.9 g/dL (ref 3.8–4.9)
Alkaline Phosphatase: 84 IU/L (ref 44–121)
Bilirubin Total: 0.3 mg/dL (ref 0.0–1.2)
Bilirubin, Direct: <0.1 mg/dL (ref 0.00–0.40)
Total Protein: 8.5 g/dL (ref 6.0–8.5)

## 2022-11-06 NOTE — Progress Notes (Signed)
Generator Serial No.  [id number] Implant Date: Sep 21, 2020 Generator Battery: 11-25%   Normal OutPut Current: 2.5 mA Signal Frequency: 30 Hz  Pulse Width:  500 sec Signal on time: 30 seconds Signal off time: 1.1  minutes    Auto stimulation Output current 2.75 mA Pulse Width 500 microseconds On time 30 seconds   Magnetic Output Current: 3.0 mA Pulse Width: 500 sec  Signal On Time:  60 seconds    [id number] Electronic analysis of implanted neurostimulator pulse generator system (eg, rate, pulse, amplitude and duration, configuration of wave form, battery status, electrode selectability, output modulation, cycling, impedance and patient compliance measurements); simple or complex brain, spinal cord, or peripheral (ie, cranial nerve, peripheral nerve, autonomic nerve, neuromuscular) neurostimulator pulse generator/transmitter, without reprogramming

## 2022-11-06 NOTE — Telephone Encounter (Signed)
I have faxed results to [facility] at [phone] on 01.17.24

## 2022-11-08 ENCOUNTER — Inpatient Hospital Stay: Payer: Medicaid Other

## 2022-11-08 ENCOUNTER — Inpatient Hospital Stay: Payer: Medicaid Other | Attending: Hematology | Admitting: Hematology

## 2022-11-08 VITALS — BP 116/78 | HR 80 | Temp 97.0°F | Resp 18 | Wt 175.7 lb

## 2022-11-08 DIAGNOSIS — D72819 Decreased white blood cell count, unspecified: Secondary | ICD-10-CM | POA: Diagnosis not present

## 2022-11-08 DIAGNOSIS — D72829 Elevated white blood cell count, unspecified: Secondary | ICD-10-CM | POA: Diagnosis not present

## 2022-11-08 DIAGNOSIS — D696 Thrombocytopenia, unspecified: Secondary | ICD-10-CM | POA: Diagnosis not present

## 2022-11-08 DIAGNOSIS — Z79899 Other long term (current) drug therapy: Secondary | ICD-10-CM | POA: Insufficient documentation

## 2022-11-08 DIAGNOSIS — F17210 Nicotine dependence, cigarettes, uncomplicated: Secondary | ICD-10-CM | POA: Diagnosis not present

## 2022-11-08 DIAGNOSIS — I10 Essential (primary) hypertension: Secondary | ICD-10-CM | POA: Insufficient documentation

## 2022-11-08 LAB — HEPATITIS C ANTIBODY: HCV Ab: NONREACTIVE

## 2022-11-08 LAB — CBC WITH DIFFERENTIAL (CANCER CENTER ONLY)
Abs Immature Granulocytes: 0.01 10*3/uL (ref 0.00–0.07)
Basophils Absolute: 0 10*3/uL (ref 0.0–0.1)
Basophils Relative: 1 %
Eosinophils Absolute: 0.1 10*3/uL (ref 0.0–0.5)
Eosinophils Relative: 2 %
HCT: 33.5 % — ABNORMAL LOW (ref 39.0–52.0)
Hemoglobin: 10.9 g/dL — ABNORMAL LOW (ref 13.0–17.0)
Immature Granulocytes: 0 %
Lymphocytes Relative: 45 %
Lymphs Abs: 1.6 10*3/uL (ref 0.7–4.0)
MCH: 24.4 pg — ABNORMAL LOW (ref 26.0–34.0)
MCHC: 32.5 g/dL (ref 30.0–36.0)
MCV: 75.1 fL — ABNORMAL LOW (ref 80.0–100.0)
Monocytes Absolute: 0.3 10*3/uL (ref 0.1–1.0)
Monocytes Relative: 8 %
Neutro Abs: 1.5 10*3/uL — ABNORMAL LOW (ref 1.7–7.7)
Neutrophils Relative %: 44 %
Platelet Count: 104 10*3/uL — ABNORMAL LOW (ref 150–400)
RBC: 4.46 MIL/uL (ref 4.22–5.81)
RDW: 15.5 % (ref 11.5–15.5)
WBC Count: 3.5 10*3/uL — ABNORMAL LOW (ref 4.0–10.5)
nRBC: 0 % (ref 0.0–0.2)

## 2022-11-08 LAB — CMP (CANCER CENTER ONLY)
ALT: 8 U/L (ref 0–44)
AST: 13 U/L — ABNORMAL LOW (ref 15–41)
Albumin: 3.7 g/dL (ref 3.5–5.0)
Alkaline Phosphatase: 59 U/L (ref 38–126)
Anion gap: 4 — ABNORMAL LOW (ref 5–15)
BUN: 9 mg/dL (ref 6–20)
CO2: 30 mmol/L (ref 22–32)
Calcium: 10.3 mg/dL (ref 8.9–10.3)
Chloride: 106 mmol/L (ref 98–111)
Creatinine: 0.8 mg/dL (ref 0.61–1.24)
GFR, Estimated: >60 mL/min (ref >60)
Glucose, Bld: 67 mg/dL — ABNORMAL LOW (ref 70–99)
Potassium: 4.4 mmol/L (ref 3.5–5.1)
Sodium: 140 mmol/L (ref 135–145)
Total Bilirubin: 0.4 mg/dL (ref 0.3–1.2)
Total Protein: 8 g/dL (ref 6.5–8.1)

## 2022-11-08 LAB — VITAMIN B12: Vitamin B-12: 739 pg/mL (ref 180–914)

## 2022-11-08 NOTE — Progress Notes (Signed)
HEMATOLOGY/ONCOLOGY CONSULTATION NOTE  Date of Service: 11/08/2022  Patient Care Team: Loletta Specter, PA-C as PCP - General (Physician Assistant) Nahser, Deloris Ping, MD as PCP - Cardiology (Cardiology)  CHIEF COMPLAINTS/PURPOSE OF CONSULTATION:  Leucopenia/thrombocytopenia  HISTORY OF PRESENTING ILLNESS:   Seth Brock is a wonderful 62 y.o. male who has been referred to [place] by Dr .Lily Kocher, Maura Crandall, PA-C  for evaluation and management of his fluctuating blood counts.(Leucopenia and thrombocytopenia)   Patient has history of seizures, hypertension, cognitive impairment who presented to the ED for evaluation of a breakthrough seizure.  Patient was recently admitted to the hospital on December 22.  He was discharged on December 30.  At that time the patient was admitted to the hospital for multiple seizures in the setting of influenza A and elevated Depakote level.  Patient ended up having his Depakote decreased while continuing his Keppra and lacosamide.   He presented to the ED on 10/22/22 for seizure disorder and according to EMS report he had a seizure that evening while in the shower. Evaluation and diagnostic testing in the emergency department did not suggest an emergent condition requiring admission or immediate intervention beyond what has been performed at that time.   Today, he is accompanied by a friend from his rehab/group home. He reports that his last seizure was during his ED visit 10/22/22. During his seizures, he experiences Lethargic and Loss of balance, but has newly experienced Shaking and trembling during episodes. He has no abdominal pain. He has been released from the Rehab center today and his Depakote dose was increased.  He complains of loss of appetite. He does not take multivitamin on regular basis. He eats 1-2 meals a day. He reports that he drinks 1-2 beers every other week. His friend adds that his alcohol consumption is high- bingeing 7 days in a row at least  twice a week. He denies taking any drugs. However, he does smoke cigarettes.  His friend tracks his anti-seizure medication and but notes that pt sometimes chooses not to take his medication occasionally as he believes he only has seizures every once in a while. He follows up with Buel Ream, J, NP for his seizures.  He attributes his loss of appetite to his lack of teeth in the top. He has not FU with anyone to have new dentures made. His friend reports that he previously had dentures that need to be adjusted. Pt reports that he has lost his dentures at the rehab facility.  He had an influenza infection late last year. However, no respiratory symptoms at this time. No skin infections or urinary problems  He denies ever being diagnosed with Hepatitis C or HIV. He reports feeling no difference overall since the last 6 months.  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HCC)    FHx: atrial fibrillation    Hyperlipidemia    Hypertension    Irregular heart beat    Neurological impairment in adult    Seizures (HCC)     SURGICAL HISTORY: Past Surgical History:  Procedure Laterality Date   None Listed     VAGUS NERVE STIMULATOR INSERTION Left 03/05/2016   Procedure: Left Vagal Nerve Stimulator Placement;  Surgeon: Lisbeth Renshaw, MD;  Location: [facility];  Service: Neurosurgery;  Laterality: Left;  Left Vagal Nerve Stimulator placement   VAGUS NERVE STIMULATOR  INSERTION Left 09/26/2020   Procedure: VAGAL NERVE STIMULATOR BATTERY REPLACEMENT;  Surgeon: Lisbeth Renshaw, MD;  Location: [facility] OR;  Service: Neurosurgery;  Laterality: Left;  anterior    SOCIAL HISTORY: Social History   Socioeconomic History   Marital status: Single    Spouse name: Not on file   Number of children: 0   Years of education: Not on file   Highest education level: Not on file   Occupational History   Not on file  Tobacco Use   Smoking status: Every Day    Types: Cigarettes    Passive exposure: Current   Smokeless tobacco: Never   Tobacco comments:    as a teenager, 11/23/18 smoking 5-6 cigars daily  Vaping Use   Vaping Use: Never used  Substance and Sexual Activity   Alcohol use: Not Currently    Comment: occasionally   Drug use: No    Comment: patient quit 1993   Sexual activity: Not on file  Other Topics Concern   Not on file  Social History Narrative   11/23/18 Patient lives with friend       His mother passed away 2015/07/21 from a stroke.   Patient does not work.    Patient drinks caffeine daily. (Tea)   Social Determinants of Health   Financial Resource Strain: Not on file  Food Insecurity: Food Insecurity Present (10/12/2022)   Hunger Vital Sign    Worried About Running Out of Food in the Last Year: Never true    Ran Out of Food in the Last Year: Sometimes true  Transportation Needs: No Transportation Needs (10/12/2022)   PRAPARE - Administrator, Civil Service (Medical): No    Lack of Transportation (Non-Medical): No  Physical Activity: Not on file  Stress: Not on file  Social Connections: Not on file  Intimate Partner Violence: Not At Risk (10/12/2022)   Humiliation, Afraid, Rape, and Kick questionnaire    Fear of Current or Ex-Partner: No    Emotionally Abused: No    Physically Abused: No    Sexually Abused: No    FAMILY HISTORY: No family history on file.  ALLERGIES:  has No Known Allergies.  MEDICATIONS:  Current Outpatient Medications  Medication Sig Dispense Refill   atorvastatin (LIPITOR) 20 MG tablet TAKE 1 TABLET BY MOUTH EVERY DAY (Patient taking differently: Take 20 mg by mouth daily.) 90 tablet 1   CHOLECALCIFEROL PO Take by mouth.     diltiazem (CARDIZEM CD) 240 MG 24 hr capsule Take 1 capsule (240 mg total) by mouth daily. 30 capsule 11   divalproex (DEPAKOTE) 250 MG DR tablet Take 3 tablets (750 mg  total) by mouth every 12 (twelve) hours. 180 tablet 2   divalproex (DEPAKOTE) 500 MG DR tablet Take 2 tablets (1,000 mg total) by mouth 2 (two) times daily. 120 tablet 11   Lacosamide 150 MG TABS TAKE 2 TABLETS BY MOUTH 2 TIMES DAILY. (Patient taking differently: Take 300 mg by mouth in the morning and at bedtime.) 360 tablet 3   levETIRAcetam (KEPPRA) 1000 MG tablet Take 2 tablets (2,000 mg total) by mouth 2 (two) times daily. 360 tablet 4   No current facility-administered medications for this visit.    REVIEW OF SYSTEMS:    10 Point review of Systems was done is negative except as noted above.  PHYSICAL EXAMINATION: ECOG PERFORMANCE STATUS: 2 - Symptomatic, <50% confined to bed  . Vitals:   11/08/22 1130  BP: 116/78  Pulse: 80  Resp: 18  Temp: (!) 97 F (36.1 C)  SpO2: 100%   Filed Weights   11/08/22 1130  Weight: 175 lb 11.2 oz (79.7 kg)   .Body mass index is 22.56 kg/m.  GENERAL:alert, in no acute distress and comfortable SKIN: no acute rashes, no significant lesions EYES: conjunctiva are pink and non-injected, sclera anicteric OROPHARYNX: MMM, no exudates, no oropharyngeal erythema or ulceration NECK: supple, no JVD LYMPH:  no palpable lymphadenopathy in the cervical, axillary or inguinal regions LUNGS: clear to auscultation b/l with normal respiratory effort HEART: regular rate & rhythm ABDOMEN:  normoactive bowel sounds , non tender, not distended. Extremity: no pedal edema PSYCH: alert & oriented x 3 with fluent speech NEURO: no focal motor/sensory deficits  LABORATORY DATA:  I have reviewed the data as listed .    Latest Ref Rng & Units 11/08/2022   12:57 PM 11/05/2022    2:53 PM 10/22/2022   10:00 PM  CBC  WBC 4.0 - 10.5 K/uL 3.5  3.2  5.2   Hemoglobin 13.0 - 17.0 g/dL 13.2  44.0  10.2   Hematocrit 37.5 - 51.0 % 39.0 - 52.0 % 35.1    33.5  39.1  35.5   Platelets 150 - 400 K/uL 104  111  207    .    Latest Ref Rng & Units 11/08/2022   12:57 PM  11/05/2022    2:53 PM 10/22/2022   10:00 PM  CMP  Glucose 70 - 99 mg/dL 67   97   BUN 6 - 20 mg/dL 9   13   Creatinine 7.25 - 1.24 mg/dL 3.66   4.40   Sodium 347 - 145 mmol/L 140   137   Potassium 3.5 - 5.1 mmol/L 4.4   4.3   Chloride 98 - 111 mmol/L 106   106   CO2 22 - 32 mmol/L 30   25   Calcium 8.9 - 10.3 mg/dL 42.5   9.9   Total Protein 6.5 - 8.1 g/dL 8.0  8.5    Total Bilirubin 0.3 - 1.2 mg/dL 0.4  0.3    Alkaline Phos 38 - 126 U/L 59  84    AST 15 - 41 U/L 13  17    ALT 0 - 44 U/L 8  12      RADIOGRAPHIC STUDIES: I have personally reviewed the radiological images as listed and agreed with the findings in the report. CT Head Wo Contrast  Result Date: 10/12/2022 CLINICAL DATA:  Altered mental status. EXAM: CT HEAD WITHOUT CONTRAST TECHNIQUE: Contiguous axial images were obtained from the base of the skull through the vertex without intravenous contrast. RADIATION DOSE REDUCTION: This exam was performed according to the departmental dose-optimization program which includes automated exposure control, adjustment of the mA and/or kV according to patient size and/or use of iterative reconstruction technique. COMPARISON:  Head CT dated 05/26/2021. FINDINGS: Brain: The ventricles and sulci appropriate size for patient's age. Mild periventricular and deep white matter chronic microvascular ischemic changes noted. There is no acute intracranial hemorrhage. No mass effect or midline shift. No extra-axial fluid collection. Vascular: No hyperdense vessel or unexpected calcification. Skull: Normal. Negative for fracture or focal lesion. Sinuses/Orbits: Mild mucoperiosteal thickening of paranasal sinuses. No air-fluid level. The mastoid air cells are clear. Other: None IMPRESSION: 1. No acute intracranial pathology. 2. Mild chronic microvascular ischemic changes. Electronically Signed   By: Elgie Collard M.D.   On: 10/12/2022 01:18   DG Chest [facility]  Result Date: 10/11/2022 CLINICAL DATA:   Altered mental status EXAM: PORTABLE CHEST 1 VIEW COMPARISON:  Radiographs 05/07/2017 FINDINGS: The heart size and mediastinal contours are within normal limits. Both lungs are clear. The visualized skeletal structures are unremarkable. Left chest wall neurostimulator. IMPRESSION: No active disease. Electronically Signed   By: Minerva Fester M.D.   On: 10/11/2022 18:53    ASSESSMENT & PLAN:    62 yo with   1 Fluctuating leucocytopenia and thrombocytopenia likely related to his anti epileptic medications - depakote /keppra/lacosmaide. Excess ETOH use also a potential factor Poor po intake with vitamin deficiencies could be a concern. Recent influenza A infection also a consideration  PLAN: -Educated pt that his blood counts, including WBC and platelets, have been fluctuating.  -WBC likely to run slightly low, no concern as not progressively dropping. Likely due to seizure medication. No indication of bone marrow or blood disorders. -discussed lab results from 11/05/22 with patient. CBC showed WBC of 3.2 K, hemoglobin of 12.6 K, and platelets of 111 K. -Discussed that WBC are not progressively dropping. Main concern was if influenza infection has role and trigger seizures.  -Advised pt not to mix medication with alcohol. Recommended absolute ETOH cessation. -Educated pt on partial vs generalized seizures -reccommended patient to FU with rehab facility to possibly retreive his dentures -Recommended pt to take OTC vitamin B-complex, one capsule a day -order blood tests  . Orders Placed This Encounter  Procedures   CBC with Differential (Cancer Center Only)    Standing Status:   Future    Number of Occurrences:   1    Standing Expiration Date:   11/09/2023   Hepatitis C antibody    Standing Status:   Future    Number of Occurrences:   1    Standing Expiration Date:   11/09/2023   CMP (Cancer Center only)    Standing Status:   Future    Number of Occurrences:   1    Standing Expiration  Date:   11/09/2023   Vitamin B12    Standing Status:   Future    Number of Occurrences:   1    Standing Expiration Date:   11/08/2023   Copper, serum    Standing Status:   Future    Number of Occurrences:   1    Standing Expiration Date:   11/08/2023   Folate RBC    Standing Status:   Future    Number of Occurrences:   1    Standing Expiration Date:   11/09/2023    FOLLOW-UP: Labs today Phone visit with Dr Candise Che in 1-2 months  The total time spent in the appointment was 45 minutes* .  All of the patient's questions were answered with apparent satisfaction. The patient knows to call the clinic with any problems, questions or concerns.   Wyvonnia Lora MD MS AAHIVMS [facility] Hematology/Oncology Physician [facility]  .*Total Encounter Time as defined by the Centers for Medicare and Medicaid Services includes, in addition to the face-to-face time of a patient visit (documented in the note above) non-face-to-face time: obtaining and reviewing outside history, ordering and reviewing medications, tests or procedures, care coordination (communications with other health care professionals or caregivers) and documentation in the medical record.   I,Mitra Faeizi,acting as a Neurosurgeon for Wyvonnia Lora, MD.,have documented all relevant documentation on the behalf of Wyvonnia Lora, MD,as directed by  Wyvonnia Lora, MD while in the presence of Wyvonnia Lora, MD.  .  I have reviewed the above documentation for accuracy and completeness, and I agree with the above. Johney Maine MD

## 2022-11-12 LAB — FOLATE RBC
Folate, Hemolysate: 248 ng/mL
Folate, RBC: 707 ng/mL (ref >498)
Hematocrit: 35.1 % — ABNORMAL LOW (ref 37.5–51.0)

## 2022-11-12 LAB — COPPER, SERUM: Copper: 115 ug/dL (ref 69–132)

## 2022-11-14 ENCOUNTER — Telehealth: Payer: Self-pay | Admitting: Neurology

## 2022-11-14 DIAGNOSIS — Z9689 Presence of other specified functional implants: Secondary | ICD-10-CM

## 2022-11-14 NOTE — Telephone Encounter (Addendum)
Referral placed to Dr. Kathyrn Sheriff for VNS battery replacement. Currently 11-25%. Please let the patient know we are referring him. We need to fax form in and send last 2 office notes, insurance, demographics to [facility].   Orders Placed This Encounter  Procedures   Ambulatory referral to Neurosurgery

## 2022-11-14 NOTE — Telephone Encounter (Signed)
Referral for Neurosurgery fax to [facility] to see Dr. Kathyrn Sheriff as requested. Phone: [phone], Fax: [phone].

## 2022-11-15 ENCOUNTER — Telehealth: Payer: Self-pay | Admitting: Hematology

## 2022-11-15 NOTE — Telephone Encounter (Signed)
Scheduled per inbasket message, patient has been called and notified. 

## 2022-11-18 NOTE — Telephone Encounter (Signed)
I have faxed the battery replacement form to VNS therapy intake team.

## 2022-11-19 ENCOUNTER — Telehealth: Payer: Self-pay | Admitting: Neurology

## 2022-11-19 NOTE — Telephone Encounter (Signed)
Gerome Apley is calling from [facility]. Stated she need pt some recent office visit notes faxed to her for referral. Faxed) [phone]

## 2022-11-19 NOTE — Telephone Encounter (Signed)
I attempted to fax to number in phone message and it failed, I then called [facility] and she gave me a one time use fax number. I have faxed and received confirmation that office notes were recieved

## 2022-11-21 ENCOUNTER — Telehealth: Payer: Self-pay

## 2022-11-21 NOTE — Telephone Encounter (Signed)
Received a fax from VNS intake team stating the dx from most recent office visit is no a covered diagnosis. I have placed the fax in NP's office for review and added note if appropriate.

## 2022-11-21 NOTE — Telephone Encounter (Signed)
I have faxed most recent office notes to VNS intake team.

## 2022-11-25 DIAGNOSIS — Z4542 Encounter for adjustment and management of neuropacemaker (brain) (peripheral nerve) (spinal cord): Secondary | ICD-10-CM | POA: Diagnosis not present

## 2022-11-26 ENCOUNTER — Other Ambulatory Visit: Payer: Self-pay | Admitting: Neurosurgery

## 2022-11-26 NOTE — Telephone Encounter (Signed)
Pt scheduled for battery replacement on 12/13/22 with neurosurgery.

## 2022-11-28 ENCOUNTER — Other Ambulatory Visit: Payer: Self-pay | Admitting: Neurosurgery

## 2022-11-30 ENCOUNTER — Other Ambulatory Visit: Payer: Self-pay | Admitting: Neurology

## 2022-11-30 DIAGNOSIS — G40209 Localization-related (focal) (partial) symptomatic epilepsy and epileptic syndromes with complex partial seizures, not intractable, without status epilepticus: Secondary | ICD-10-CM

## 2022-12-02 NOTE — Telephone Encounter (Signed)
  New rx needed.

## 2022-12-06 NOTE — Pre-Procedure Instructions (Signed)
Surgical Instructions    Your procedure is scheduled on Friday, February 23rd.  Report to [facility] Main Entrance "A" at 05:30 A.M., then check in with the Admitting office.  Call this number if you have problems the morning of surgery:  [phone]  If you have any questions prior to your surgery date call [phone]: Open Monday-Friday 8am-4pm If you experience any cold or flu symptoms such as cough, fever, chills, shortness of breath, etc. between now and your scheduled surgery, please notify us at the above number.     Remember:  Do not eat or drink after midnight the night before your surgery     Take these medicines the morning of surgery with A SIP OF WATER  atorvastatin (LIPITOR)  diltiazem (CARDIZEM CD)  divalproex (DEPAKOTE ER)  Lacosamide  levETIRAcetam (KEPPRA)    As of today, STOP taking any Aspirin (unless otherwise instructed by your surgeon) Aleve, Naproxen, Ibuprofen, Motrin, Advil, Goody's, BC's, all herbal medications, fish oil, and all vitamins.                     Do NOT Smoke (Tobacco/Vaping) for 24 hours prior to your procedure.  If you use a CPAP at night, you may bring your mask/headgear for your overnight stay.   Contacts, glasses, piercing's, hearing aid's, dentures or partials may not be worn into surgery, please bring cases for these belongings.    For patients admitted to the hospital, discharge time will be determined by your treatment team.   Patients discharged the day of surgery will not be allowed to drive home, and someone needs to stay with them for 24 hours.  SURGICAL WAITING ROOM VISITATION Patients having surgery or a procedure may have no more than 2 support people in the waiting area - these visitors may rotate.   Children under the age of 47 must have an adult with them who is not the patient. If the patient needs to stay at the hospital during part of their recovery, the visitor guidelines for inpatient rooms apply. Pre-op nurse  will coordinate an appropriate time for 1 support person to accompany patient in pre-op.  This support person may not rotate.   Please refer to the [facility] website for the visitor guidelines for Inpatients (after your surgery is over and you are in a regular room).    Special instructions:   [place]- Preparing For Surgery  Before surgery, you can play an important role. Because skin is not sterile, your skin needs to be as free of germs as possible. You can reduce the number of germs on your skin by washing with CHG (chlorahexidine gluconate) Soap before surgery.  CHG is an antiseptic cleaner which kills germs and bonds with the skin to continue killing germs even after washing.    Oral Hygiene is also important to reduce your risk of infection.  Remember - BRUSH YOUR TEETH THE MORNING OF SURGERY WITH YOUR REGULAR TOOTHPASTE  Please do not use if you have an allergy to CHG or antibacterial soaps. If your skin becomes reddened/irritated stop using the CHG.  Do not shave (including legs and underarms) for at least 48 hours prior to first CHG shower. It is OK to shave your face.  Please follow these instructions carefully.   Shower the NIGHT BEFORE SURGERY and the MORNING OF SURGERY  If you chose to wash your hair, wash your hair first as usual with your normal shampoo.  After you shampoo, rinse your hair  and body thoroughly to remove the shampoo.  Use CHG Soap as you would any other liquid soap. You can apply CHG directly to the skin and wash gently with a scrungie or a clean washcloth.   Apply the CHG Soap to your body ONLY FROM THE NECK DOWN.  Do not use on open wounds or open sores. Avoid contact with your eyes, ears, mouth and genitals (private parts). Wash Face and genitals (private parts)  with your normal soap.   Wash thoroughly, paying special attention to the area where your surgery will be performed.  Thoroughly rinse your body with warm water from the neck down.  DO NOT  shower/wash with your normal soap after using and rinsing off the CHG Soap.  Pat yourself dry with a CLEAN TOWEL.  Wear CLEAN PAJAMAS to bed the night before surgery  Place CLEAN SHEETS on your bed the night before your surgery  DO NOT SLEEP WITH PETS.   Day of Surgery: Take a shower with CHG soap. Do not wear jewelry  Do not wear lotions, powders, colognes, or deodorant. Men may shave face and neck. Do not bring valuables to the hospital. [facility] is not responsible for any belongings or valuables.  Wear Clean/Comfortable clothing the morning of surgery Remember to brush your teeth WITH YOUR REGULAR TOOTHPASTE.   Please read over the following fact sheets that you were given.    If you received a COVID test during your pre-op visit  it is requested that you wear a mask when out in public, stay away from anyone that may not be feeling well and notify your surgeon if you develop symptoms. If you have been in contact with anyone that has tested positive in the last 10 days please notify you surgeon.

## 2022-12-09 ENCOUNTER — Encounter (HOSPITAL_COMMUNITY)
Admission: RE | Admit: 2022-12-09 | Discharge: 2022-12-09 | Disposition: A | Discharge status: Home / Self Care | Payer: Medicaid Other | Source: Ambulatory Visit | Attending: Neurosurgery | Admitting: Neurosurgery

## 2022-12-09 ENCOUNTER — Other Ambulatory Visit: Payer: Self-pay

## 2022-12-09 ENCOUNTER — Encounter (HOSPITAL_COMMUNITY): Payer: Self-pay

## 2022-12-09 VITALS — BP 136/91 | HR 80 | Temp 98.3°F | Resp 17 | Ht 73.0 in | Wt 181.7 lb

## 2022-12-09 DIAGNOSIS — Z01812 Encounter for preprocedural laboratory examination: Secondary | ICD-10-CM | POA: Diagnosis not present

## 2022-12-09 DIAGNOSIS — I2510 Atherosclerotic heart disease of native coronary artery without angina pectoris: Secondary | ICD-10-CM

## 2022-12-09 DIAGNOSIS — Z01818 Encounter for other preprocedural examination: Secondary | ICD-10-CM

## 2022-12-09 LAB — CBC
HCT: 32.8 % — ABNORMAL LOW (ref 39.0–52.0)
Hemoglobin: 10.3 g/dL — ABNORMAL LOW (ref 13.0–17.0)
MCH: 24.5 pg — ABNORMAL LOW (ref 26.0–34.0)
MCHC: 31.4 g/dL (ref 30.0–36.0)
MCV: 78.1 fL — ABNORMAL LOW (ref 80.0–100.0)
Platelets: 101 10*3/uL — ABNORMAL LOW (ref 150–400)
RBC: 4.2 MIL/uL — ABNORMAL LOW (ref 4.22–5.81)
RDW: 17.8 % — ABNORMAL HIGH (ref 11.5–15.5)
WBC: 3.5 10*3/uL — ABNORMAL LOW (ref 4.0–10.5)
nRBC: 0 % (ref 0.0–0.2)

## 2022-12-09 LAB — SURGICAL PCR SCREEN
MRSA, PCR: NEGATIVE
Staphylococcus aureus: NEGATIVE

## 2022-12-09 LAB — BASIC METABOLIC PANEL
Anion gap: 12 (ref 5–15)
BUN: 10 mg/dL (ref 6–20)
CO2: 24 mmol/L (ref 22–32)
Calcium: 10 mg/dL (ref 8.9–10.3)
Chloride: 101 mmol/L (ref 98–111)
Creatinine, Ser: 0.93 mg/dL (ref 0.61–1.24)
GFR, Estimated: >60 mL/min (ref >60)
Glucose, Bld: 65 mg/dL — ABNORMAL LOW (ref 70–99)
Potassium: 4.8 mmol/L (ref 3.5–5.1)
Sodium: 137 mmol/L (ref 135–145)

## 2022-12-09 NOTE — Progress Notes (Signed)
PCP - [name], PA-C Cardiologist - Dr. Acie Fredrickson; formerly Dr. Virgina Jock   PPM/ICD - n/a Vagus nerve stimulator. Pt instructed to bring remote with him on the DOS.  Chest x-ray - n/a EKG - 10/22/22 Stress Test - denies ECHO - 06/28/20 Cardiac Cath -denies   Sleep Study - denies CPAP - denies  Blood Thinner Instructions: n/a Aspirin Instructions: n/a  NPO  COVID TEST- n/a  Anesthesia review: Yes, hx of CAD.   Patient denies shortness of breath, fever, cough and chest pain at PAT appointment   All instructions explained to the patient, with a verbal understanding of the material. Patient agrees to go over the instructions while at home for a better understanding. Patient also instructed to self quarantine after being tested for COVID-19. The opportunity to ask questions was provided.

## 2022-12-09 NOTE — Telephone Encounter (Signed)
Pt surgery date still scheduled.
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
## 2022-12-11 ENCOUNTER — Other Ambulatory Visit: Payer: Self-pay | Admitting: Neurology

## 2022-12-11 DIAGNOSIS — G40209 Localization-related (focal) (partial) symptomatic epilepsy and epileptic syndromes with complex partial seizures, not intractable, without status epilepticus: Secondary | ICD-10-CM

## 2022-12-13 ENCOUNTER — Encounter (HOSPITAL_COMMUNITY)
Admission: RE | Disposition: A | Discharge status: Home / Self Care | Payer: Self-pay | Source: Home / Self Care | Attending: Neurosurgery

## 2022-12-13 ENCOUNTER — Ambulatory Visit (HOSPITAL_COMMUNITY)
Admission: RE | Admit: 2022-12-13 | Discharge: 2022-12-13 | Disposition: A | Discharge status: Home / Self Care | Payer: Medicaid Other | Attending: Neurosurgery | Admitting: Neurosurgery

## 2022-12-13 ENCOUNTER — Ambulatory Visit (HOSPITAL_BASED_OUTPATIENT_CLINIC_OR_DEPARTMENT_OTHER): Payer: Medicaid Other | Admitting: Anesthesiology

## 2022-12-13 ENCOUNTER — Ambulatory Visit (HOSPITAL_COMMUNITY): Payer: Medicaid Other | Admitting: Vascular Surgery

## 2022-12-13 ENCOUNTER — Other Ambulatory Visit: Payer: Self-pay

## 2022-12-13 ENCOUNTER — Encounter (HOSPITAL_COMMUNITY): Payer: Self-pay | Admitting: Neurosurgery

## 2022-12-13 DIAGNOSIS — I4891 Unspecified atrial fibrillation: Secondary | ICD-10-CM | POA: Insufficient documentation

## 2022-12-13 DIAGNOSIS — I10 Essential (primary) hypertension: Secondary | ICD-10-CM

## 2022-12-13 DIAGNOSIS — Z4542 Encounter for adjustment and management of neuropacemaker (brain) (peripheral nerve) (spinal cord): Secondary | ICD-10-CM | POA: Diagnosis not present

## 2022-12-13 DIAGNOSIS — G40919 Epilepsy, unspecified, intractable, without status epilepticus: Secondary | ICD-10-CM | POA: Insufficient documentation

## 2022-12-13 DIAGNOSIS — Z4549 Encounter for adjustment and management of other implanted nervous system device: Secondary | ICD-10-CM | POA: Diagnosis not present

## 2022-12-13 DIAGNOSIS — G40419 Other generalized epilepsy and epileptic syndromes, intractable, without status epilepticus: Secondary | ICD-10-CM | POA: Diagnosis not present

## 2022-12-13 DIAGNOSIS — F17200 Nicotine dependence, unspecified, uncomplicated: Secondary | ICD-10-CM | POA: Diagnosis not present

## 2022-12-13 DIAGNOSIS — F17210 Nicotine dependence, cigarettes, uncomplicated: Secondary | ICD-10-CM | POA: Diagnosis not present

## 2022-12-13 DIAGNOSIS — I2510 Atherosclerotic heart disease of native coronary artery without angina pectoris: Secondary | ICD-10-CM | POA: Insufficient documentation

## 2022-12-13 DIAGNOSIS — D649 Anemia, unspecified: Secondary | ICD-10-CM | POA: Diagnosis not present

## 2022-12-13 DIAGNOSIS — D759 Disease of blood and blood-forming organs, unspecified: Secondary | ICD-10-CM | POA: Insufficient documentation

## 2022-12-13 SURGERY — VAGAL NERVE STIMULATOR BATTERY EXCHANGE
Anesthesia: General

## 2022-12-13 MED ORDER — OXYCODONE HCL 5 MG/5ML PO SOLN: 5.0000 mg | Freq: Once | ORAL | Status: DC | PRN

## 2022-12-13 MED ORDER — PHENYLEPHRINE 80 MCG/ML (10ML) SYRINGE FOR IV PUSH (FOR BLOOD PRESSURE SUPPORT)
PREFILLED_SYRINGE | INTRAVENOUS | Status: DC | PRN
  Administered 2022-12-13 (×2): 160 ug via INTRAVENOUS
  Administered 2022-12-13: 80 ug via INTRAVENOUS

## 2022-12-13 MED ORDER — BUPIVACAINE HCL (PF) 0.5 % IJ SOLN
INTRAMUSCULAR | Status: AC
  Filled 2022-12-13: qty 30

## 2022-12-13 MED ORDER — CHLORHEXIDINE GLUCONATE CLOTH 2 % EX PADS: 6.0000 | MEDICATED_PAD | Freq: Once | CUTANEOUS | Status: DC

## 2022-12-13 MED ORDER — LACTATED RINGERS IV SOLN: INTRAVENOUS | Status: DC

## 2022-12-13 MED ORDER — LIDOCAINE-EPINEPHRINE 1 %-1:100000 IJ SOLN
INTRAMUSCULAR | Status: DC | PRN
  Administered 2022-12-13: 2 mL

## 2022-12-13 MED ORDER — MEPERIDINE HCL 25 MG/ML IJ SOLN: 6.2500 mg | INTRAMUSCULAR | Status: DC | PRN

## 2022-12-13 MED ORDER — HYDROCODONE-ACETAMINOPHEN 5-325 MG PO TABS: 1.0000 | ORAL_TABLET | Freq: Four times a day (QID) | ORAL | 0 refills | Status: AC | PRN

## 2022-12-13 MED ORDER — PROPOFOL 10 MG/ML IV BOLUS
INTRAVENOUS | Status: AC
  Filled 2022-12-13: qty 20

## 2022-12-13 MED ORDER — ACETAMINOPHEN 160 MG/5ML PO SOLN: 325.0000 mg | ORAL | Status: DC | PRN

## 2022-12-13 MED ORDER — CHLORHEXIDINE GLUCONATE 0.12 % MT SOLN
15.0000 mL | Freq: Once | OROMUCOSAL | Status: AC
  Administered 2022-12-13: 15 mL via OROMUCOSAL
  Filled 2022-12-13: qty 15

## 2022-12-13 MED ORDER — ONDANSETRON HCL 4 MG/2ML IJ SOLN
INTRAMUSCULAR | Status: DC | PRN
  Administered 2022-12-13: 4 mg via INTRAVENOUS

## 2022-12-13 MED ORDER — MIDAZOLAM HCL 2 MG/2ML IJ SOLN
INTRAMUSCULAR | Status: AC
  Filled 2022-12-13: qty 2

## 2022-12-13 MED ORDER — ACETAMINOPHEN 325 MG PO TABS: 325.0000 mg | ORAL_TABLET | ORAL | Status: DC | PRN

## 2022-12-13 MED ORDER — DEXAMETHASONE SODIUM PHOSPHATE 10 MG/ML IJ SOLN
INTRAMUSCULAR | Status: DC | PRN
  Administered 2022-12-13: 10 mg via INTRAVENOUS

## 2022-12-13 MED ORDER — FENTANYL CITRATE (PF) 250 MCG/5ML IJ SOLN
INTRAMUSCULAR | Status: AC
  Filled 2022-12-13: qty 5

## 2022-12-13 MED ORDER — ONDANSETRON HCL 4 MG/2ML IJ SOLN: 4.0000 mg | Freq: Once | INTRAMUSCULAR | Status: DC | PRN

## 2022-12-13 MED ORDER — LIDOCAINE-EPINEPHRINE 1 %-1:100000 IJ SOLN
INTRAMUSCULAR | Status: AC
  Filled 2022-12-13: qty 1

## 2022-12-13 MED ORDER — ORAL CARE MOUTH RINSE: 15.0000 mL | Freq: Once | OROMUCOSAL | Status: AC

## 2022-12-13 MED ORDER — PROPOFOL 10 MG/ML IV BOLUS
INTRAVENOUS | Status: DC | PRN
  Administered 2022-12-13: 200 mg via INTRAVENOUS

## 2022-12-13 MED ORDER — KETOROLAC TROMETHAMINE 30 MG/ML IJ SOLN
INTRAMUSCULAR | Status: DC | PRN
  Administered 2022-12-13: 30 mg via INTRAVENOUS

## 2022-12-13 MED ORDER — 0.9 % SODIUM CHLORIDE (POUR BTL) OPTIME
TOPICAL | Status: DC | PRN
  Administered 2022-12-13: 1000 mL

## 2022-12-13 MED ORDER — EPHEDRINE SULFATE-NACL 50-0.9 MG/10ML-% IV SOSY
PREFILLED_SYRINGE | INTRAVENOUS | Status: DC | PRN
  Administered 2022-12-13 (×2): 5 mg via INTRAVENOUS

## 2022-12-13 MED ORDER — BUPIVACAINE HCL 0.5 % IJ SOLN
INTRAMUSCULAR | Status: DC | PRN
  Administered 2022-12-13: 2 mL

## 2022-12-13 MED ORDER — LIDOCAINE 2% (20 MG/ML) 5 ML SYRINGE
INTRAMUSCULAR | Status: DC | PRN
  Administered 2022-12-13: 100 mg via INTRAVENOUS

## 2022-12-13 MED ORDER — CEFAZOLIN SODIUM-DEXTROSE 2-4 GM/100ML-% IV SOLN
2.0000 g | INTRAVENOUS | Status: AC
  Administered 2022-12-13: 2 g via INTRAVENOUS
  Filled 2022-12-13: qty 100

## 2022-12-13 MED ORDER — OXYCODONE HCL 5 MG PO TABS: 5.0000 mg | ORAL_TABLET | Freq: Once | ORAL | Status: DC | PRN

## 2022-12-13 MED ORDER — FENTANYL CITRATE (PF) 100 MCG/2ML IJ SOLN: 25.0000 ug | INTRAMUSCULAR | Status: DC | PRN

## 2022-12-13 SURGICAL SUPPLY — 35 items
BAG COUNTER SPONGE SURGICOUNT (BAG) ×1 IMPLANT
BENZOIN TINCTURE PRP APPL 2/3 (GAUZE/BANDAGES/DRESSINGS) IMPLANT
BLADE CLIPPER SURG (BLADE) IMPLANT
CANISTER SUCT 3000ML PPV (MISCELLANEOUS) ×1 IMPLANT
DERMABOND ADVANCED .7 DNX12 (GAUZE/BANDAGES/DRESSINGS) ×1 IMPLANT
DRAPE CAMERA VIDEO/LASER (DRAPES) ×1 IMPLANT
DRAPE LAPAROTOMY 100X72 PEDS (DRAPES) ×1 IMPLANT
DRSG OPSITE POSTOP 3X4 (GAUZE/BANDAGES/DRESSINGS) IMPLANT
DURAPREP 6ML APPLICATOR 50/CS (WOUND CARE) ×1 IMPLANT
ELECT REM PT RETURN 9FT ADLT (ELECTROSURGICAL) ×1
ELECTRODE REM PT RTRN 9FT ADLT (ELECTROSURGICAL) ×1 IMPLANT
GENERATOR MODEL 106 ASPIRE (Neuro Prosthesis/Implant) IMPLANT
GLOVE BIO SURGEON STRL SZ7.5 (GLOVE) ×1 IMPLANT
GLOVE BIOGEL PI IND STRL 7.5 (GLOVE) ×2 IMPLANT
GLOVE ECLIPSE 7.0 STRL STRAW (GLOVE) ×1 IMPLANT
GOWN STRL REUS W/ TWL LRG LVL3 (GOWN DISPOSABLE) ×2 IMPLANT
GOWN STRL REUS W/ TWL XL LVL3 (GOWN DISPOSABLE) IMPLANT
GOWN STRL REUS W/TWL 2XL LVL3 (GOWN DISPOSABLE) IMPLANT
GOWN STRL REUS W/TWL LRG LVL3 (GOWN DISPOSABLE) ×1
GOWN STRL REUS W/TWL XL LVL3 (GOWN DISPOSABLE) ×1
KIT BASIN OR (CUSTOM PROCEDURE TRAY) ×1 IMPLANT
KIT TURNOVER KIT B (KITS) ×1 IMPLANT
NEEDLE HYPO 22GX1.5 SAFETY (NEEDLE) ×1 IMPLANT
NS IRRIG 1000ML POUR BTL (IV SOLUTION) ×1 IMPLANT
PACK LAMINECTOMY NEURO (CUSTOM PROCEDURE TRAY) ×1 IMPLANT
PAD ARMBOARD 7.5X6 YLW CONV (MISCELLANEOUS) ×3 IMPLANT
SPIKE FLUID TRANSFER (MISCELLANEOUS) ×1 IMPLANT
SPONGE INTESTINAL PEANUT (DISPOSABLE) IMPLANT
SPONGE SURGIFOAM ABS GEL SZ50 (HEMOSTASIS) IMPLANT
SUT ETHILON 3 0 FSL (SUTURE) IMPLANT
SUT VIC AB 3-0 SH 8-18 (SUTURE) ×1 IMPLANT
SUT VICRYL 3-0 RB1 18 ABS (SUTURE) ×2 IMPLANT
TOWEL GREEN STERILE (TOWEL DISPOSABLE) ×1 IMPLANT
TOWEL GREEN STERILE FF (TOWEL DISPOSABLE) ×1 IMPLANT
WATER STERILE IRR 1000ML POUR (IV SOLUTION) ×1 IMPLANT

## 2022-12-13 NOTE — H&P (Signed)
Chief Complaint   Epilepsy  History of Present Illness  Seth Brock is a 62 y.o. male with a history of medically intractable epliepsy and previous VNS implantation. Recent interrogation revealed near end-of-life of the battery and he presents for battery change.  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[place])    FHx: atrial fibrillation    Hyperlipidemia    Hypertension    Irregular heart beat    Neurological impairment in adult    Seizures [facility])     Past Surgical History   Past Surgical History:  Procedure Laterality Date   None Listed     VAGUS NERVE STIMULATOR INSERTION Left 03/05/2016   Procedure: Left Vagal Nerve Stimulator Placement;  Surgeon: Consuella Lose, MD;  [facility];  Service: Neurosurgery;  Laterality: Left;  Left Vagal Nerve Stimulator placement   VAGUS NERVE STIMULATOR INSERTION Left 09/26/2020   Procedure: VAGAL NERVE STIMULATOR BATTERY REPLACEMENT;  Surgeon: Consuella Lose, MD;  Location: [place];  Service: Neurosurgery;  Laterality: Left;  anterior    Social History   Social History   Tobacco Use   Smoking status: Every Day    Packs/day: 0.50    Types: Cigarettes, Cigars    Passive exposure: Current   Smokeless tobacco: Never   Tobacco comments:    as a teenager, 12/09/22 smoking 10 cigarettes daily, 1 cigar once a week.    Vaping Use   Vaping Use: Never used  Substance Use Topics   Alcohol use: Not Currently    Comment: occasionally   Drug use: No    Comment: patient quit 1993    Medications   Prior to Admission medications   Medication Sig Start Date End Date Taking? Authorizing Provider  atorvastatin (LIPITOR) 20 MG tablet TAKE 1 TABLET BY MOUTH EVERY DAY Patient taking differently: Take 20 mg by mouth daily. 07/08/22  Yes Minette Brine, Amy J, NP  divalproex (DEPAKOTE ER) 500 MG 24 hr tablet  TAKE 2 TABLETS BY MOUTH TWICE A DAY Patient taking differently: Take 500 mg by mouth 2 (two) times daily. 12/04/22  Yes Suzzanne Cloud, NP  divalproex (DEPAKOTE) 250 MG DR tablet Take 3 tablets (750 mg total) by mouth every 12 (twelve) hours. Patient taking differently: Take 250 mg by mouth 2 (two) times daily. 10/18/22 01/16/23 Yes Linward Natal, MD  Lacosamide 150 MG TABS TAKE 2 TABLETS BY MOUTH TWICE A DAY 12/02/22  Yes Marcial Pacas, MD  levETIRAcetam (KEPPRA) 1000 MG tablet Take 2 tablets (2,000 mg total) by mouth 2 (two) times daily. 11/05/22  Yes Suzzanne Cloud, NP  Multiple Vitamin (MULTIVITAMIN WITH MINERALS) TABS tablet Take 1 tablet by mouth daily.   Yes [provider]  diltiazem (CARDIZEM CD) 240 MG 24 hr capsule Take 1 capsule (240 mg total) by mouth daily. 04/26/22   Nahser, Wonda Cheng, MD  divalproex (DEPAKOTE) 500 MG DR tablet Take 2 tablets (1,000 mg total) by mouth 2 (two) times daily. Patient not taking: Reported on 12/05/2022 11/05/22   Suzzanne Cloud, NP    Allergies  No Known Allergies  Review of Systems  ROS  Neurologic Exam  Awake, alert, oriented Speech fluent, appropriate CN grossly intact Motor exam: Upper Extremities Deltoid Bicep Tricep Grip  Right 5/5 5/5 5/5 5/5  Left 5/5 5/5 5/5 5/5   Lower Extremities IP Quad PF  DF EHL  Right 5/5 5/5 5/5 5/5 5/5  Left 5/5 5/5 5/5 5/5 5/5   Sensation grossly intact to LT  Impression  - 62 y.o. male with medically intractable epilepsy and near end-of-life of previously changed VNS battery  Plan  - proceed with battery change  I have reviewed the indications for the procedure as well as the details of the procedure and the expected postoperative course and recovery at length with the patient in the office. We have also reviewed in detail the risks, benefits, and alternatives to the procedure. All questions were answered and Seth Brock provided informed consent to proceed.  Consuella Lose, MD [facility]  [facility]

## 2022-12-13 NOTE — Op Note (Signed)
  NEUROSURGERY OPERATIVE NOTE   PREOP DIAGNOSIS:  VNS battery end-of-life Medically intractable epilepsy   POSTOP DIAGNOSIS: Same  PROCEDURE: 1. Replacement of left Vagal nerve stimulator battery  SURGEON: Dr. Consuella Lose, MD  ASSISTANT: None  ANESTHESIA: General Endotracheal  EBL: Minimal  SPECIMENS: None  DRAINS: None  COMPLICATIONS: None immediate  CONDITION: Hemodynamically stable to PACU  HISTORY: Seth Brock is a 62 y.o. who was initially seen in the outpatient clinic who previously underwent placement of a VNS several years ago. Recent interrogation revealed near end-of-life of the battery. The risks and benefits of the surgery were reviewed in detail. After all questions were answered, informed consent was obtained.  PROCEDURE IN DETAIL: After informed consent was obtained and witnessed, the patient was brought to the operating room. After induction of general anesthesia, the patient was positioned on the operative table in the supine position. All pressure points were meticulously padded. Left infraclavicular skin incision was then marked out and prepped and draped in the usual sterile fashion.  After timeout was conducted, skin incision was infiltrated with local anesthetic with epinephrine. Skin incision was then made sharply, the subcutaneous pocket was opened and the generator identified. This was explanted. The lead was removed and the new generator connected and replaced in the subcutaneous pocket. The new generator was interrogated and noted to have normal lead impedence, accurate hear rate detection and was reprogrammed with the previous settings. Wound was then irrigated with saline and closed with interrupted 3-0 Vicryl stitches. Skin was closed with dermabond.  At the end of the case all sponge, needle, cottonoid, and instrument counts were correct. The patient was then extubated, transferred to the stretcher, and taken to the postanesthesia care unit in  stable hemodynamic condition.   Consuella Lose, MD [facility]

## 2022-12-13 NOTE — Anesthesia Procedure Notes (Signed)
Procedure Name: LMA Insertion Date/Time: 12/13/2022 7:55 AM  Performed by: Darletta Moll, CRNAPre-anesthesia Checklist: Patient identified, Emergency Drugs available, Suction available and Patient being monitored Patient Re-evaluated:Patient Re-evaluated prior to induction Oxygen Delivery Method: Circle system utilized Preoxygenation: Pre-oxygenation with 100% oxygen Induction Type: IV induction Ventilation: Mask ventilation without difficulty LMA: LMA inserted LMA Size: 4.0 Number of attempts: 1 Placement Confirmation: positive ETCO2, breath sounds checked- equal and bilateral and CO2 detector Tube secured with: Tape Dental Injury: Teeth and Oropharynx as per pre-operative assessment

## 2022-12-13 NOTE — Transfer of Care (Signed)
Immediate Anesthesia Transfer of Care Note  Patient: Seth Brock  Procedure(s) Performed: Vagal Nerve Stimulator Battery Exchange  Patient Location: PACU  Anesthesia Type:General  Level of Consciousness: drowsy and patient cooperative  Airway & Oxygen Therapy: Patient Spontanous Breathing and Patient connected to nasal cannula oxygen  Post-op Assessment: Report given to RN, Post -op Vital signs reviewed and stable, and Patient moving all extremities X 4  Post vital signs: Reviewed and stable  Last Vitals:  Vitals Value Taken Time  BP 143/93 12/13/22 0845  Temp    Pulse 63 12/13/22 0848  Resp 15 12/13/22 0848  SpO2 100 % 12/13/22 0848  Vitals shown include unvalidated device data.  Last Pain:  Vitals:   12/13/22 0618  TempSrc:   PainSc: 0-No pain         Complications: No notable events documented.

## 2022-12-13 NOTE — Anesthesia Postprocedure Evaluation (Signed)
Anesthesia Post Note  Patient: Seth Brock  Procedure(s) Performed: Vagal Nerve Stimulator Battery Exchange     Patient location during evaluation: PACU Anesthesia Type: General Level of consciousness: awake and alert Pain management: pain level controlled Vital Signs Assessment: post-procedure vital signs reviewed and stable Respiratory status: spontaneous breathing, nonlabored ventilation, respiratory function stable and patient connected to nasal cannula oxygen Cardiovascular status: blood pressure returned to baseline and stable Postop Assessment: no apparent nausea or vomiting Anesthetic complications: no  No notable events documented.  Last Vitals:  Vitals:   12/13/22 0900 12/13/22 0915  BP: 119/75 127/80  Pulse: 60 (!) 58  Resp: 15 13  Temp:    SpO2: 100% 100%    Last Pain:  Vitals:   12/13/22 0915  TempSrc:   PainSc: 0-No pain                 Jazara Swiney

## 2022-12-18 NOTE — Telephone Encounter (Signed)
Call Mr Seth Brock the patient emergency contact to verify if Patient filled his prescription. [name] confirm that the patient Rx was filled. Pt verbalized understanding. Pt had no questions at this time but was encouraged to call back if questions arise.

## 2023-01-01 ENCOUNTER — Inpatient Hospital Stay: Payer: Medicaid Other | Attending: Hematology | Admitting: Hematology

## 2023-01-01 DIAGNOSIS — D72819 Decreased white blood cell count, unspecified: Secondary | ICD-10-CM | POA: Diagnosis not present

## 2023-01-01 DIAGNOSIS — D696 Thrombocytopenia, unspecified: Secondary | ICD-10-CM

## 2023-01-01 DIAGNOSIS — D649 Anemia, unspecified: Secondary | ICD-10-CM | POA: Insufficient documentation

## 2023-01-01 DIAGNOSIS — F1090 Alcohol use, unspecified, uncomplicated: Secondary | ICD-10-CM | POA: Diagnosis not present

## 2023-01-01 DIAGNOSIS — F17210 Nicotine dependence, cigarettes, uncomplicated: Secondary | ICD-10-CM | POA: Insufficient documentation

## 2023-01-01 NOTE — Progress Notes (Signed)
HEMATOLOGY/ONCOLOGY PHONE VISIT NOTE  Date of Service: 01/01/2023  Patient Care Team: Tawny Asal as PCP - General (Physician Assistant) Nahser, Wonda Cheng, MD as PCP - Cardiology (Cardiology) Brunetta Genera, MD as Consulting Physician (Hematology)  CHIEF COMPLAINTS/PURPOSE OF CONSULTATION:  Continued evaluation and management of Leucopenia/thrombocytopenia  HISTORY OF PRESENTING ILLNESS:   Seth Brock is a wonderful 62 y.o. male who has been referred to [place] by Dr .Altamease Oiler, Lesli Albee, PA-C  for evaluation and management of his fluctuating blood counts.(Leucopenia and thrombocytopenia)  Patient has history of seizures, hypertension, cognitive impairment who presented to the ED for evaluation of a breakthrough seizure.  Patient was recently admitted to the hospital on December 22.  He was discharged on December 30.  At that time the patient was admitted to the hospital for multiple seizures in the setting of influenza A and elevated Depakote level.  Patient ended up having his Depakote decreased while continuing his Keppra and lacosamide.   He presented to the ED on 10/22/22 for seizure disorder and according to EMS report he had a seizure that evening while in the shower. Evaluation and diagnostic testing in the emergency department did not suggest an emergent condition requiring admission or immediate intervention beyond what has been performed at that time.   INTERVAL HISTORY:  Seth Brock is a 62 y.o. male, here for continued evaluation and management of Leucopenia/thrombocytopenia. Patient was last seen by me on 11/08/2022 and complained of seizure episodes, during which he experiences lethargy. Loss of balance, and shaking/trembling. He also complained of loss of appetite, which he attributed to lack of top teeth.  I connected with [name] on 01/01/23 at  3:30 PM EDT by telephone visit and verified that I am speaking with the correct person using two identifiers.    I discussed the limitations, risks, security and privacy concerns of performing an evaluation and management service by telemedicine and the availability of in-person appointments. I also discussed with the patient that there may be a patient responsible charge related to this service. The patient expressed understanding and agreed to proceed.   Other persons participating in the visit and their role in the encounter: none   Patient's location: home  Provider's location: [facility]   Chief Complaint: Continued evaluation and management of Leucopenia/thrombocytopenia  Today, he reports that he has compliantly taken his anti-seizure medication regularly and has not had any seizures since January 2024.   In regards to his alcohol consumption, he reports that he drinks 2-3 beers a month. He does not take any daily multivitamins at this time.  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[place])    FHx: atrial fibrillation    Hyperlipidemia    Hypertension    Irregular heart beat    Neurological impairment in adult    Seizures ([place])     SURGICAL HISTORY: Past Surgical History:  Procedure Laterality Date   None Listed     VAGUS NERVE STIMULATOR INSERTION Left 03/05/2016   Procedure: Left Vagal Nerve Stimulator Placement;  Surgeon: Consuella Lose, MD;  Location: [facility];  Service: Neurosurgery;  Laterality: Left;  Left Vagal Nerve Stimulator placement   VAGUS NERVE STIMULATOR INSERTION Left 09/26/2020   Procedure: VAGAL NERVE STIMULATOR BATTERY REPLACEMENT;  Surgeon: Consuella Lose, MD;  Location: [facility];  Service: Neurosurgery;  Laterality: Left;  anterior  SOCIAL HISTORY: Social History   Socioeconomic History   Marital status: Single    Spouse name: Not on file   Number of children: 0   Years of education: Not on file   Highest education level:  Not on file  Occupational History   Not on file  Tobacco Use   Smoking status: Every Day    Packs/day: 0.50    Types: Cigarettes, Cigars    Passive exposure: Current   Smokeless tobacco: Never   Tobacco comments:    as a teenager, 12/09/22 smoking 10 cigarettes daily, 1 cigar once a week.    Vaping Use   Vaping Use: Never used  Substance and Sexual Activity   Alcohol use: Not Currently    Comment: occasionally   Drug use: No    Comment: patient quit 1993   Sexual activity: Not on file  Other Topics Concern   Not on file  Social History Narrative   11/23/18 Patient lives with friend       His mother passed away August 01, 2015 from a stroke.   Patient does not work.    Patient drinks caffeine daily. (Tea)   Social Determinants of Health   Financial Resource Strain: Not on file  Food Insecurity: Food Insecurity Present (10/12/2022)   Hunger Vital Sign    Worried About Running Out of Food in the Last Year: Never true    Ran Out of Food in the Last Year: Sometimes true  Transportation Needs: No Transportation Needs (10/12/2022)   PRAPARE - Hydrologist (Medical): No    Lack of Transportation (Non-Medical): No  Physical Activity: Not on file  Stress: Not on file  Social Connections: Not on file  Intimate Partner Violence: Not At Risk (10/12/2022)   Humiliation, Afraid, Rape, and Kick questionnaire    Fear of Current or Ex-Partner: No    Emotionally Abused: No    Physically Abused: No    Sexually Abused: No    FAMILY HISTORY: No family history on file.  ALLERGIES:  has No Known Allergies.  MEDICATIONS:  Current Outpatient Medications  Medication Sig Dispense Refill   atorvastatin (LIPITOR) 20 MG tablet TAKE 1 TABLET BY MOUTH EVERY DAY (Patient taking differently: Take 20 mg by mouth daily.) 90 tablet 1   diltiazem (CARDIZEM CD) 240 MG 24 hr capsule Take 1 capsule (240 mg total) by mouth daily. 30 capsule 11   divalproex (DEPAKOTE ER) 500 MG 24 hr  tablet TAKE 2 TABLETS BY MOUTH TWICE A DAY (Patient taking differently: Take 500 mg by mouth 2 (two) times daily.) 360 tablet 3   divalproex (DEPAKOTE) 250 MG DR tablet Take 3 tablets (750 mg total) by mouth every 12 (twelve) hours. (Patient taking differently: Take 250 mg by mouth 2 (two) times daily.) 180 tablet 2   divalproex (DEPAKOTE) 500 MG DR tablet Take 2 tablets (1,000 mg total) by mouth 2 (two) times daily. (Patient not taking: Reported on 12/05/2022) 120 tablet 11   Lacosamide 150 MG TABS TAKE 2 TABLETS BY MOUTH TWICE A DAY 360 tablet 4   levETIRAcetam (KEPPRA) 1000 MG tablet Take 2 tablets (2,000 mg total) by mouth 2 (two) times daily. 360 tablet 4   Multiple Vitamin (MULTIVITAMIN WITH MINERALS) TABS tablet Take 1 tablet by mouth daily.     No current facility-administered medications for this visit.    REVIEW OF SYSTEMS:    10 Point review of Systems was done is negative except as noted above.  PHYSICAL EXAMINATION: TELEPHONE VISIT  LABORATORY DATA:  I have reviewed the data as listed .    Latest Ref Rng & Units 12/09/2022    8:41 AM 11/08/2022   12:57 PM 11/05/2022    2:53 PM  CBC  WBC 4.0 - 10.5 K/uL 3.5  3.5  3.2   Hemoglobin 13.0 - 17.0 g/dL 10.3  10.9  12.6   Hematocrit 39.0 - 52.0 % 32.8  35.1    33.5  39.1   Platelets 150 - 400 K/uL 101  104  111    .    Latest Ref Rng & Units 12/09/2022    8:41 AM 11/08/2022   12:57 PM 11/05/2022    2:53 PM  CMP  Glucose 70 - 99 mg/dL 65  67    BUN 6 - 20 mg/dL 10  9    Creatinine 0.61 - 1.24 mg/dL 0.93  0.80    Sodium 135 - 145 mmol/L 137  140    Potassium 3.5 - 5.1 mmol/L 4.8  4.4    Chloride 98 - 111 mmol/L 101  106    CO2 22 - 32 mmol/L 24  30    Calcium 8.9 - 10.3 mg/dL 10.0  10.3    Total Protein 6.5 - 8.1 g/dL  8.0  8.5   Total Bilirubin 0.3 - 1.2 mg/dL  0.4  0.3   Alkaline Phos 38 - 126 U/L  59  84   AST 15 - 41 U/L  13  17   ALT 0 - 44 U/L  8  12     RADIOGRAPHIC STUDIES: I have personally reviewed the  radiological images as listed and agreed with the findings in the report. No results found.  ASSESSMENT & PLAN:    62 yo with   1 Fluctuating leucocytopenia and thrombocytopenia likely related to his anti epileptic medications - depakote /keppra/lacosmaide. Excess ETOH use also a potential factor Poor po intake with vitamin deficiencies could be a concern. Recent influenza A infection also a consideration  PLAN:   -Discussed lab results on 12/09/2022 with patient. CBC showed WBC improved to 3.5K, hemoglobin of 10.3, and platelets stable at 101K. -slightly anemic -Hepatitis C test negative -Copper and folic acid tests normal -recommended patient to avoid alcohol use completely -WBC and platelets were slightly low initially, likely due to seizure medication. No indication of bone marrow or blood disorders at this time.  -recommend patient to take OTC daily multivitamin and B complex 1 cap po daily -patient shall continue to follow up with Maryjean Ka, J, NP to monitor his seizures and counts with his anti-epileptic medications  . No orders of the defined types were placed in this encounter.   FOLLOW-UP: RTC with PCP  The total time spent in the appointment was 12 minutes* .  All of the patient's questions were answered with apparent satisfaction. The patient knows to call the clinic with any problems, questions or concerns.   Sullivan Lone MD MS AAHIVMS [facility] Hematology/Oncology Physician [facility]  .*Total Encounter Time as defined by the Centers for Medicare and Medicaid Services includes, in addition to the face-to-face time of a patient visit (documented in the note above) non-face-to-face time: obtaining and reviewing outside history, ordering and reviewing medications, tests or procedures, care coordination (communications with other health care professionals or caregivers) and documentation in the medical record.    I,Mitra Faeizi,acting as a Education administrator for  Sullivan Lone, MD.,have documented all relevant documentation on the  behalf of Sullivan Lone, MD,as directed by  Sullivan Lone, MD while in the presence of Sullivan Lone, MD.  .I have reviewed the above documentation for accuracy and completeness, and I agree with the above. Brunetta Genera MD

## 2023-01-08 ENCOUNTER — Encounter: Payer: Self-pay | Admitting: Neurology

## 2023-01-08 ENCOUNTER — Ambulatory Visit: Payer: Medicaid Other | Admitting: Neurology

## 2023-01-08 ENCOUNTER — Telehealth: Payer: Self-pay | Admitting: Neurology

## 2023-01-08 DIAGNOSIS — G40209 Localization-related (focal) (partial) symptomatic epilepsy and epileptic syndromes with complex partial seizures, not intractable, without status epilepticus: Secondary | ICD-10-CM | POA: Diagnosis not present

## 2023-01-08 MED ORDER — DIVALPROEX SODIUM ER 500 MG PO TB24: 1000.0000 mg | ORAL_TABLET | Freq: Two times a day (BID) | ORAL | 4 refills | Status: DC

## 2023-01-08 MED ORDER — DIVALPROEX SODIUM 500 MG PO DR TAB: 1000.0000 mg | DELAYED_RELEASE_TABLET | Freq: Two times a day (BID) | ORAL | 11 refills | Status: DC

## 2023-01-08 MED ORDER — LEVETIRACETAM 1000 MG PO TABS: 2000.0000 mg | ORAL_TABLET | Freq: Two times a day (BID) | ORAL | 4 refills | Status: DC

## 2023-01-08 NOTE — Telephone Encounter (Signed)
Called pharmacy and cancelled ER and made sure DR was on file.
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
## 2023-01-08 NOTE — Patient Instructions (Signed)
Continue current medications  Swipe your VNS at least twice daily Call for increase in seizures

## 2023-01-08 NOTE — Telephone Encounter (Signed)
Some discrepancy in his usual medications, supposed to be taking Depakote DR 500 mg, 2 tablets twice a day. The ER Depakote was sent in, please ask pharmacy to cancel this. Thanks. I verified with his caregiver Mr. Rosana Hoes.

## 2023-01-13 ENCOUNTER — Other Ambulatory Visit: Payer: Self-pay | Admitting: Family

## 2023-01-13 DIAGNOSIS — E785 Hyperlipidemia, unspecified: Secondary | ICD-10-CM

## 2023-02-10 DIAGNOSIS — R55 Syncope and collapse: Secondary | ICD-10-CM | POA: Diagnosis not present

## 2023-05-09 ENCOUNTER — Other Ambulatory Visit: Payer: Self-pay | Admitting: Cardiovascular Disease

## 2023-05-09 DIAGNOSIS — I4892 Unspecified atrial flutter: Secondary | ICD-10-CM

## 2023-05-14 ENCOUNTER — Encounter: Payer: Self-pay | Admitting: Neurology

## 2023-05-14 ENCOUNTER — Ambulatory Visit: Payer: Medicaid Other | Admitting: Neurology

## 2023-05-14 VITALS — BP 156/69 | HR 49 | Ht 74.0 in | Wt 185.5 lb

## 2023-05-14 DIAGNOSIS — G40209 Localization-related (focal) (partial) symptomatic epilepsy and epileptic syndromes with complex partial seizures, not intractable, without status epilepticus: Secondary | ICD-10-CM | POA: Diagnosis not present

## 2023-05-14 DIAGNOSIS — Z9689 Presence of other specified functional implants: Secondary | ICD-10-CM

## 2023-05-14 MED ORDER — LEVETIRACETAM 1000 MG PO TABS: 2000.0000 mg | ORAL_TABLET | Freq: Two times a day (BID) | ORAL | 4 refills | Status: DC

## 2023-05-14 MED ORDER — DIVALPROEX SODIUM 500 MG PO DR TAB: 1000.0000 mg | DELAYED_RELEASE_TABLET | Freq: Two times a day (BID) | ORAL | 11 refills | Status: DC
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
## 2023-05-14 NOTE — Patient Instructions (Addendum)
Swipe VNS at least twice daily, I adjusted your settings today, if you have any issues let me know  check labs today  continue current Keppra, Depakote, Vimpat

## 2023-05-15 ENCOUNTER — Telehealth: Payer: Self-pay | Admitting: Neurology

## 2023-05-15 LAB — CBC WITH DIFFERENTIAL/PLATELET
Basophils Absolute: 0 10*3/uL (ref 0.0–0.2)
Basos: 1 %
EOS (ABSOLUTE): 0.2 10*3/uL (ref 0.0–0.4)
Eos: 7 %
Hematocrit: 37.5 % (ref 37.5–51.0)
Hemoglobin: 11.7 g/dL — ABNORMAL LOW (ref 13.0–17.7)
Immature Grans (Abs): 0 10*3/uL (ref 0.0–0.1)
Immature Granulocytes: 0 %
Lymphocytes Absolute: 1.3 10*3/uL (ref 0.7–3.1)
Lymphs: 45 %
MCH: 24 pg — ABNORMAL LOW (ref 26.6–33.0)
MCV: 77 fL — ABNORMAL LOW (ref 79–97)
Monocytes Absolute: 0.3 10*3/uL (ref 0.1–0.9)
Monocytes: 10 %
Neutrophils Absolute: 1.1 10*3/uL — ABNORMAL LOW (ref 1.4–7.0)
Neutrophils: 37 %
Platelets: 69 10*3/uL — CL (ref 150–450)
RBC: 4.88 x10E6/uL (ref 4.14–5.80)
RDW: 16.8 % — ABNORMAL HIGH (ref 11.6–15.4)
WBC: 3 10*3/uL — ABNORMAL LOW (ref 3.4–10.8)

## 2023-05-15 LAB — VALPROIC ACID LEVEL: Valproic Acid Lvl: 58 ug/mL (ref 50–100)

## 2023-05-15 LAB — LACOSAMIDE

## 2023-05-15 LAB — COMPREHENSIVE METABOLIC PANEL
ALT: 13 IU/L (ref 0–44)
AST: 23 IU/L (ref 0–40)
Alkaline Phosphatase: 61 IU/L (ref 44–121)
BUN/Creatinine Ratio: 13 (ref 9–20)
BUN: 12 mg/dL (ref 6–24)
Bilirubin Total: 0.4 mg/dL (ref 0.0–1.2)
CO2: 23 mmol/L (ref 20–29)
Calcium: 10 mg/dL (ref 8.7–10.2)
Chloride: 106 mmol/L (ref 96–106)
Creatinine, Ser: 0.9 mg/dL (ref 0.76–1.27)
Globulin, Total: 3.7 g/dL (ref 1.5–4.5)
Glucose: 103 mg/dL — ABNORMAL HIGH (ref 70–99)
Potassium: 4 mmol/L (ref 3.5–5.2)
Sodium: 141 mmol/L (ref 134–144)
Total Protein: 7.8 g/dL (ref 6.0–8.5)
eGFR: 98 mL/min/{1.73_m2} (ref >59)

## 2023-05-15 LAB — LEVETIRACETAM LEVEL

## 2023-05-15 NOTE — Telephone Encounter (Signed)
Please call, platelet level is very low at 69, I worry this is due to Depakote, would recommend reducing back to 500 mg twice daily (currently on 1000 mg BID), recheck platelet level next week on Monday. I will order for Monday reminder to call him to come. May need to change the Depakote all together.

## 2023-05-15 NOTE — Telephone Encounter (Signed)
Called and spoke to patient and roommate vicky on Speakerphone and relayed the note, patient was agreeable and states that they will come for labs on monday

## 2023-05-16 LAB — COMPREHENSIVE METABOLIC PANEL: Albumin: 4.1 g/dL (ref 3.8–4.9)

## 2023-05-19 ENCOUNTER — Telehealth: Payer: Self-pay | Admitting: Neurology

## 2023-05-19 DIAGNOSIS — G40209 Localization-related (focal) (partial) symptomatic epilepsy and epileptic syndromes with complex partial seizures, not intractable, without status epilepticus: Secondary | ICD-10-CM

## 2023-05-19 NOTE — Telephone Encounter (Signed)
Please call, ask him to come today to have CBC rechecked due to low platelet. Orders are in.  Vimpat level is also on the lower end, please ensure he is taking as prescribed.  Last week I had him reduce Depakote from the 1000 mg twice daily to 500 mg twice daily.  Suspicious low platelets are due to Depakote.

## 2023-05-19 NOTE — Telephone Encounter (Signed)
Called patient and he states he will be here today for labs.

## 2023-05-20 ENCOUNTER — Telehealth: Payer: Self-pay

## 2023-05-20 NOTE — Telephone Encounter (Signed)
Call to patient and asked him when he was coming for labs, he states he will come tomorrow. Reviewed importance of these labs and patient verbalized understanding. Patient states he is doing well since the VNS adjustment yesterday.

## 2023-05-21 ENCOUNTER — Other Ambulatory Visit (INDEPENDENT_AMBULATORY_CARE_PROVIDER_SITE_OTHER): Payer: Self-pay

## 2023-05-21 DIAGNOSIS — Z0289 Encounter for other administrative examinations: Secondary | ICD-10-CM

## 2023-05-21 DIAGNOSIS — G40209 Localization-related (focal) (partial) symptomatic epilepsy and epileptic syndromes with complex partial seizures, not intractable, without status epilepticus: Secondary | ICD-10-CM

## 2023-06-11 ENCOUNTER — Other Ambulatory Visit: Payer: Self-pay | Admitting: Neurology

## 2023-06-11 DIAGNOSIS — G40209 Localization-related (focal) (partial) symptomatic epilepsy and epileptic syndromes with complex partial seizures, not intractable, without status epilepticus: Secondary | ICD-10-CM

## 2023-06-12 NOTE — Telephone Encounter (Signed)
Requested Prescriptions   Pending Prescriptions Disp Refills   Lacosamide 150 MG TABS [Pharmacy Med Name: LACOSAMIDE 150 MG TABLET] 120 tablet     Sig: TAKE 2 TABLETS BY MOUTH TWICE A DAY   Last seen 05/14/23, next appt scheduled 07/24/23  Dispenses    Dispensed Days Supply Quantity Provider Pharmacy  LACOSAMIDE 150 MG TABLET 05/14/2023 30 120 each Levert Feinstein, MD [facility] [phone] - G...  LACOSAMIDE 150 MG TABLET 04/05/2023 30 120 each Levert Feinstein, MD [facility] [phone] - G...  LACOSAMIDE 150 MG TABLET 02/22/2023 30 120 each Levert Feinstein, MD [facility] [phone] - G...  LACOSAMIDE 150 MG TABLET 01/13/2023 30 120 each Levert Feinstein, MD [facility] [phone] - G...  LACOSAMIDE 150 MG TABLET 12/02/2022 30 120 each Levert Feinstein, MD [facility] [phone] - G...  LACOSAMIDE 150MG  TAB 10/21/2022 30 120 tablet Emiliano Dyer, FNP [facility] ...  LACOSAMIDE 150 MG TABLET 07/26/2022 90 360 each Levert Feinstein, MD [facility] [phone] - G.Marland KitchenMarland Kitchen

## 2023-07-21 ENCOUNTER — Encounter: Payer: Self-pay | Admitting: Neurology

## 2023-07-21 ENCOUNTER — Ambulatory Visit: Payer: Medicaid Other | Admitting: Neurology

## 2023-07-21 VITALS — BP 145/82 | HR 64 | Resp 18 | Ht 74.0 in

## 2023-07-21 DIAGNOSIS — G40209 Localization-related (focal) (partial) symptomatic epilepsy and epileptic syndromes with complex partial seizures, not intractable, without status epilepticus: Secondary | ICD-10-CM | POA: Diagnosis not present

## 2023-07-21 DIAGNOSIS — Z9689 Presence of other specified functional implants: Secondary | ICD-10-CM

## 2023-07-21 DIAGNOSIS — R569 Unspecified convulsions: Secondary | ICD-10-CM

## 2023-07-21 DIAGNOSIS — F79 Unspecified intellectual disabilities: Secondary | ICD-10-CM
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
## 2023-07-22 ENCOUNTER — Telehealth: Payer: Self-pay | Admitting: Neurology

## 2023-07-22 LAB — CBC WITH DIFFERENTIAL/PLATELET
Basophils Absolute: 0 10*3/uL (ref 0.0–0.2)
Basos: 1 %
EOS (ABSOLUTE): 0.2 10*3/uL (ref 0.0–0.4)
Eos: 6 %
Hematocrit: 41.5 % (ref 37.5–51.0)
Hemoglobin: 12.3 g/dL — ABNORMAL LOW (ref 13.0–17.7)
Immature Grans (Abs): 0 10*3/uL (ref 0.0–0.1)
Immature Granulocytes: 0 %
Lymphocytes Absolute: 1.4 10*3/uL (ref 0.7–3.1)
Lymphs: 38 %
MCH: 24.6 pg — ABNORMAL LOW (ref 26.6–33.0)
MCHC: 29.6 g/dL — ABNORMAL LOW (ref 31.5–35.7)
MCV: 83 fL (ref 79–97)
Monocytes Absolute: 0.3 10*3/uL (ref 0.1–0.9)
Monocytes: 7 %
Neutrophils Absolute: 1.8 10*3/uL (ref 1.4–7.0)
Neutrophils: 48 %
Platelets: 177 10*3/uL (ref 150–450)
RBC: 5.01 x10E6/uL (ref 4.14–5.80)
RDW: 16.5 % — ABNORMAL HIGH (ref 11.6–15.4)
WBC: 3.7 10*3/uL (ref 3.4–10.8)

## 2023-07-22 LAB — VALPROIC ACID LEVEL: Valproic Acid Lvl: 51 ug/mL (ref 50–100)

## 2023-07-22 NOTE — Telephone Encounter (Signed)
Please call, labs show good stability, platelet level is normal at 177, Depakote level is at baseline at 51.  No medication changes.  Ensure he swipes VNS 2-3 times a day.

## 2023-07-24 ENCOUNTER — Ambulatory Visit: Payer: Medicaid Other | Admitting: Neurology

## 2023-11-19 ENCOUNTER — Encounter: Payer: Self-pay | Admitting: Family

## 2023-11-19 ENCOUNTER — Ambulatory Visit (INDEPENDENT_AMBULATORY_CARE_PROVIDER_SITE_OTHER): Payer: Medicaid Other | Admitting: Family

## 2023-11-19 VITALS — BP 135/77 | HR 90 | Temp 98.0°F | Ht 74.0 in | Wt 193.4 lb

## 2023-11-19 DIAGNOSIS — Z130 Encounter for screening for diseases of the blood and blood-forming organs and certain disorders involving the immune mechanism: Secondary | ICD-10-CM | POA: Diagnosis not present

## 2023-11-19 DIAGNOSIS — Z8679 Personal history of other diseases of the circulatory system: Secondary | ICD-10-CM | POA: Diagnosis not present

## 2023-11-19 DIAGNOSIS — Z13228 Encounter for screening for other metabolic disorders: Secondary | ICD-10-CM

## 2023-11-19 DIAGNOSIS — Z0000 Encounter for general adult medical examination without abnormal findings: Secondary | ICD-10-CM

## 2023-11-19 DIAGNOSIS — Z131 Encounter for screening for diabetes mellitus: Secondary | ICD-10-CM | POA: Diagnosis not present

## 2023-11-19 DIAGNOSIS — Z13220 Encounter for screening for lipoid disorders: Secondary | ICD-10-CM | POA: Diagnosis not present

## 2023-11-19 DIAGNOSIS — Z1329 Encounter for screening for other suspected endocrine disorder: Secondary | ICD-10-CM | POA: Diagnosis not present

## 2023-11-19 DIAGNOSIS — Z1211 Encounter for screening for malignant neoplasm of colon: Secondary | ICD-10-CM | POA: Diagnosis not present
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
## 2023-11-19 NOTE — Progress Notes (Signed)
Patient states no other concerns to discuss.

## 2023-11-20 ENCOUNTER — Encounter: Payer: Self-pay | Admitting: Family

## 2023-11-20 ENCOUNTER — Other Ambulatory Visit: Payer: Self-pay | Admitting: Family

## 2023-11-20 DIAGNOSIS — E785 Hyperlipidemia, unspecified: Secondary | ICD-10-CM

## 2023-11-20 LAB — CMP14+EGFR
ALT: 16 [IU]/L (ref 0–44)
AST: 29 [IU]/L (ref 0–40)
Albumin: 3.9 g/dL (ref 3.8–4.9)
Alkaline Phosphatase: 79 [IU]/L (ref 44–121)
BUN/Creatinine Ratio: 14 (ref 10–24)
BUN: 15 mg/dL (ref 8–27)
Bilirubin Total: 0.3 mg/dL (ref 0.0–1.2)
CO2: 22 mmol/L (ref 20–29)
Calcium: 10.3 mg/dL — ABNORMAL HIGH (ref 8.6–10.2)
Chloride: 103 mmol/L (ref 96–106)
Creatinine, Ser: 1.05 mg/dL (ref 0.76–1.27)
Globulin, Total: 4.2 g/dL (ref 1.5–4.5)
Glucose: 84 mg/dL (ref 70–99)
Potassium: 4.4 mmol/L (ref 3.5–5.2)
Sodium: 138 mmol/L (ref 134–144)
Total Protein: 8.1 g/dL (ref 6.0–8.5)
eGFR: 81 mL/min/{1.73_m2} (ref >59)

## 2023-11-20 LAB — CBC
Hematocrit: 42 % (ref 37.5–51.0)
Hemoglobin: 12.8 g/dL — ABNORMAL LOW (ref 13.0–17.7)
MCH: 24.6 pg — ABNORMAL LOW (ref 26.6–33.0)
MCHC: 30.5 g/dL — ABNORMAL LOW (ref 31.5–35.7)
MCV: 81 fL (ref 79–97)
Platelets: 151 10*3/uL (ref 150–450)
RBC: 5.2 x10E6/uL (ref 4.14–5.80)
RDW: 15.8 % — ABNORMAL HIGH (ref 11.6–15.4)
WBC: 4 10*3/uL (ref 3.4–10.8)

## 2023-11-20 LAB — TSH: TSH: 2.01 u[IU]/mL (ref 0.450–4.500)

## 2023-11-20 LAB — LIPID PANEL
Chol/HDL Ratio: 4.8 {ratio} (ref 0.0–5.0)
Cholesterol, Total: 133 mg/dL (ref 100–199)
HDL: 28 mg/dL — ABNORMAL LOW (ref >39)
LDL Chol Calc (NIH): 69 mg/dL (ref 0–99)
Triglycerides: 216 mg/dL — ABNORMAL HIGH (ref 0–149)
VLDL Cholesterol Cal: 36 mg/dL (ref 5–40)

## 2023-11-20 LAB — HEMOGLOBIN A1C
Est. average glucose Bld gHb Est-mCnc: 126 mg/dL
Hgb A1c MFr Bld: 6 % — ABNORMAL HIGH (ref 4.8–5.6)

## 2023-11-20 MED ORDER — ATORVASTATIN CALCIUM 20 MG PO TABS: 20.0000 mg | ORAL_TABLET | Freq: Every day | ORAL | 0 refills | Status: AC

## 2023-12-13 ENCOUNTER — Emergency Department (HOSPITAL_BASED_OUTPATIENT_CLINIC_OR_DEPARTMENT_OTHER): Payer: Medicaid Other

## 2023-12-13 ENCOUNTER — Encounter (HOSPITAL_COMMUNITY): Payer: Self-pay | Admitting: Emergency Medicine

## 2023-12-13 ENCOUNTER — Emergency Department (HOSPITAL_COMMUNITY): Payer: Medicaid Other

## 2023-12-13 ENCOUNTER — Other Ambulatory Visit: Payer: Self-pay

## 2023-12-13 ENCOUNTER — Emergency Department (HOSPITAL_COMMUNITY)
Admission: EM | Admit: 2023-12-13 | Discharge: 2023-12-13 | Disposition: A | Discharge status: Home / Self Care | Payer: Medicaid Other | Attending: Emergency Medicine | Admitting: Emergency Medicine

## 2023-12-13 DIAGNOSIS — M79662 Pain in left lower leg: Secondary | ICD-10-CM

## 2023-12-13 DIAGNOSIS — I2510 Atherosclerotic heart disease of native coronary artery without angina pectoris: Secondary | ICD-10-CM | POA: Insufficient documentation

## 2023-12-13 DIAGNOSIS — L03116 Cellulitis of left lower limb: Secondary | ICD-10-CM | POA: Diagnosis not present

## 2023-12-13 DIAGNOSIS — I10 Essential (primary) hypertension: Secondary | ICD-10-CM | POA: Diagnosis not present

## 2023-12-13 DIAGNOSIS — M79605 Pain in left leg: Secondary | ICD-10-CM | POA: Diagnosis not present

## 2023-12-13 LAB — BASIC METABOLIC PANEL
Anion gap: 7 (ref 5–15)
BUN: 11 mg/dL (ref 6–20)
CO2: 25 mmol/L (ref 22–32)
Calcium: 10 mg/dL (ref 8.9–10.3)
Chloride: 108 mmol/L (ref 98–111)
Creatinine, Ser: 0.87 mg/dL (ref 0.61–1.24)
GFR, Estimated: >60 mL/min (ref >60)
Glucose, Bld: 82 mg/dL (ref 70–99)
Potassium: 4.6 mmol/L (ref 3.5–5.1)
Sodium: 140 mmol/L (ref 135–145)

## 2023-12-13 LAB — CBC
HCT: 37.4 % — ABNORMAL LOW (ref 39.0–52.0)
Hemoglobin: 11.9 g/dL — ABNORMAL LOW (ref 13.0–17.0)
MCH: 24.8 pg — ABNORMAL LOW (ref 26.0–34.0)
MCHC: 31.8 g/dL (ref 30.0–36.0)
MCV: 78.1 fL — ABNORMAL LOW (ref 80.0–100.0)
Platelets: 156 10*3/uL (ref 150–400)
RBC: 4.79 MIL/uL (ref 4.22–5.81)
RDW: 15.5 % (ref 11.5–15.5)
WBC: 3.3 10*3/uL — ABNORMAL LOW (ref 4.0–10.5)
nRBC: 0 % (ref 0.0–0.2)

## 2023-12-13 LAB — VALPROIC ACID LEVEL: Valproic Acid Lvl: 114 ug/mL — ABNORMAL HIGH (ref 50.0–100.0)

## 2023-12-13 MED ORDER — DOXYCYCLINE HYCLATE 100 MG PO TABS
100.0000 mg | ORAL_TABLET | Freq: Once | ORAL | Status: AC
  Administered 2023-12-13: 100 mg via ORAL
  Filled 2023-12-13: qty 1

## 2023-12-13 MED ORDER — ACETAMINOPHEN 500 MG PO TABS
1000.0000 mg | ORAL_TABLET | Freq: Once | ORAL | Status: AC
  Administered 2023-12-13: 1000 mg via ORAL
  Filled 2023-12-13: qty 2

## 2023-12-13 MED ORDER — DOXYCYCLINE HYCLATE 100 MG PO CAPS: 100.0000 mg | ORAL_CAPSULE | Freq: Two times a day (BID) | ORAL | 0 refills | Status: DC

## 2023-12-13 NOTE — Discharge Instructions (Addendum)
 You have an infection of the skin of your left leg.  Follow-up closely with your doctor to complete the entire course of antibiotics.  If you develop worsening redness, swelling, pain, fever or any other new concerning symptoms you should return to the ED.

## 2023-12-13 NOTE — ED Notes (Signed)
 Vascular at bedside

## 2023-12-13 NOTE — Progress Notes (Signed)
 Left lower extremity venous duplex has been completed. Preliminary results can be found in CV Proc through chart review.  Results were given to Dr. Earlene Plater.  12/13/23 4:19 PM Olen Cordial RVT

## 2023-12-13 NOTE — ED Triage Notes (Addendum)
 Pt presents for L leg pain since Tuesday. States that he had a seizure and thinks that he injured leg at that time.  H/o epilepsy, states it is typical for him to have a seizure once a month, takes depakote and keppra. His primary concern is the leg pain.   This patient lives at a group home, tells this RN he took a bus here

## 2023-12-13 NOTE — ED Provider Notes (Signed)
 [place] EMERGENCY DEPARTMENT AT [facility] Provider Note   CSN: [id number] Arrival date & time: 12/13/23  1118     History  Chief Complaint  Patient presents with   Leg Pain    Seth Brock is a 63 y.o. male.   Leg Pain 63 year old male history of CAD, seizures, hypertension, hyperlipidemia presenting for left leg pain.  Patient states on Tuesday he had a seizure and hit his left leg.  He has pain and an abrasion over the left shin which is causing him pain so he presents for evaluation.  Is able to walk on it.  Did not hit his head.  No headache or neurologic changes.  No recent seizure frequency.  No fevers or chills or vomiting.  Patient states he lives in a group home.     Home Medications Prior to Admission medications   Medication Sig Start Date End Date Taking? Authorizing Provider  doxycycline (VIBRAMYCIN) 100 MG capsule Take 1 capsule (100 mg total) by mouth 2 (two) times daily. 12/13/23  Yes Laurence Spates, MD  atorvastatin (LIPITOR) 20 MG tablet Take 1 tablet (20 mg total) by mouth daily. 11/20/23   Rema Fendt, NP  diltiazem (CARDIZEM CD) 240 MG 24 hr capsule TAKE 1 CAPSULE BY MOUTH EVERY DAY 05/12/23   Nahser, Deloris Ping, MD  divalproex (DEPAKOTE) 500 MG DR tablet Take 2 tablets (1,000 mg total) by mouth 2 (two) times daily. 05/14/23   Glean Salvo, NP  Lacosamide 150 MG TABS TAKE 2 TABLETS BY MOUTH TWICE A DAY 06/16/23   Levert Feinstein, MD  levETIRAcetam (KEPPRA) 1000 MG tablet Take 2 tablets (2,000 mg total) by mouth 2 (two) times daily. 05/14/23   Glean Salvo, NP  Multiple Vitamin (MULTIVITAMIN WITH MINERALS) TABS tablet Take 1 tablet by mouth daily.    [provider]      Allergies    Patient has no known allergies.    Review of Systems   Review of Systems Review of systems completed and notable as per HPI.  ROS otherwise negative.   Physical Exam Updated Vital Signs BP (!) 186/87 (BP Location: Right Arm)   Pulse 91   Temp 98.2 F  (36.8 C) (Oral)   Resp 16   Wt 87 kg   SpO2 100%   BMI 24.63 kg/m  Physical Exam Vitals and nursing note reviewed.  Constitutional:      General: He is not in acute distress.    Appearance: He is well-developed.  HENT:     Head: Normocephalic and atraumatic.  Eyes:     Conjunctiva/sclera: Conjunctivae normal.  Cardiovascular:     Rate and Rhythm: Normal rate and regular rhythm.     Pulses: Normal pulses.     Heart sounds: Normal heart sounds. No murmur heard. Pulmonary:     Effort: Pulmonary effort is normal. No respiratory distress.     Breath sounds: Normal breath sounds.  Abdominal:     Palpations: Abdomen is soft.     Tenderness: There is no abdominal tenderness.  Musculoskeletal:        General: No swelling.     Cervical back: Neck supple.     Right lower leg: No edema.     Left lower leg: Edema present.     Comments: 1+ pitting edema over the left anterior shin surrounding an area of abrasion.  No fluctuance but does have some erythema and warmth.  Palpable DP and PT pulse.  Skin:  General: Skin is warm and dry.     Capillary Refill: Capillary refill takes less than 2 seconds.  Neurological:     Mental Status: He is alert.  Psychiatric:        Mood and Affect: Mood normal.     ED Results / Procedures / Treatments   Labs (all labs ordered are listed, but only abnormal results are displayed) Labs Reviewed  CBC - Abnormal; Notable for the following components:      Result Value   WBC 3.3 (*)    Hemoglobin 11.9 (*)    HCT 37.4 (*)    MCV 78.1 (*)    MCH 24.8 (*)    All other components within normal limits  VALPROIC ACID LEVEL - Abnormal; Notable for the following components:   Valproic Acid Lvl 114 (*)    All other components within normal limits  BASIC METABOLIC PANEL    EKG None  Radiology VAS [place] LOWER EXTREMITY VENOUS (DVT) (7a-7p) Result Date: 12/13/2023  Lower Venous DVT Study Patient Name:  Seth Brock  Date of Exam:   12/13/2023 Medical Rec  #: [id number]    Accession #:    [id number] Date of Birth: Oct 01, 1961   Patient Gender: M Patient Age:   69 years Exam Location:  [facility] Procedure:      VAS [place] LOWER EXTREMITY VENOUS (DVT) Referring Phys: Marja Kays Judithe Keetch --------------------------------------------------------------------------------  Indications: Pain.  Risk Factors: None identified. Comparison Study: No prior studies. Performing Technologist: Chanda Busing RVT  Examination Guidelines: A complete evaluation includes B-mode imaging, spectral Doppler, color Doppler, and power Doppler as needed of all accessible portions of each vessel. Bilateral testing is considered an integral part of a complete examination. Limited examinations for reoccurring indications may be performed as noted. The reflux portion of the exam is performed with the patient in reverse Trendelenburg.  +-----+---------------+---------+-----------+----------+--------------+ RIGHTCompressibilityPhasicitySpontaneityPropertiesThrombus Aging +-----+---------------+---------+-----------+----------+--------------+ CFV  Full           Yes      Yes                                 +-----+---------------+---------+-----------+----------+--------------+   +---------+---------------+---------+-----------+----------+--------------+ LEFT     CompressibilityPhasicitySpontaneityPropertiesThrombus Aging +---------+---------------+---------+-----------+----------+--------------+ CFV      Full           Yes      Yes                                 +---------+---------------+---------+-----------+----------+--------------+ SFJ      Full                                                        +---------+---------------+---------+-----------+----------+--------------+ FV Prox  Full                                                        +---------+---------------+---------+-----------+----------+--------------+ FV Mid   Full                                                         +---------+---------------+---------+-----------+----------+--------------+  FV DistalFull                                                        +---------+---------------+---------+-----------+----------+--------------+ PFV      Full                                                        +---------+---------------+---------+-----------+----------+--------------+ POP      Full           Yes      Yes                                 +---------+---------------+---------+-----------+----------+--------------+ PTV      Full                                                        +---------+---------------+---------+-----------+----------+--------------+ PERO     Full                                                        +---------+---------------+---------+-----------+----------+--------------+    Summary: RIGHT: - No evidence of common femoral vein obstruction.   LEFT: - There is no evidence of deep vein thrombosis in the lower extremity.  - No cystic structure found in the popliteal fossa.  *See table(s) above for measurements and observations. Electronically signed by Sherald Hess MD on 12/13/2023 at 4:52:49 PM.    Final    DG Tibia/Fibula Left Result Date: 12/13/2023 CLINICAL DATA:  Left leg pain after fall. EXAM: LEFT TIBIA AND FIBULA - 2 VIEW COMPARISON:  None Available. FINDINGS: There is no evidence of fracture or other focal bone lesions. Soft tissues are unremarkable. IMPRESSION: Negative. Electronically Signed   By: Lupita Raider M.D.   On: 12/13/2023 12:27    Procedures Procedures    Medications Ordered in ED Medications  doxycycline (VIBRA-TABS) tablet 100 mg (100 mg Oral Given 12/13/23 1433)  acetaminophen (TYLENOL) tablet 1,000 mg (1,000 mg Oral Given 12/13/23 1433)    ED Course/ Medical Decision Making/ A&P Clinical Course as of 12/13/23 1904  Sat Dec 13, 2023  1849 Seth Brock coming to pick him up. [JD]     Clinical Course User Index [JD] Laurence Spates, MD                                 Medical Decision Making Amount and/or Complexity of Data Reviewed Labs: ordered. Radiology: ordered.  Risk OTC drugs. Prescription drug management.   Medical Decision Making:   Dempsey Ahonen is a 63 y.o. male who presented to the ED today with left leg pain.  Vital signs reviewed.  On exam he is well-appearing.  He has area of swelling and  redness concerning for cellulitis likely related to underlying abrasion.  I do not see any evidence of abscess.  He is neurovascularly intact.  Given concomitant pitting edema will obtain ultrasound to rule out DVT.  No signs of sepsis.  Patient placed on continuous vitals and telemetry monitoring while in ED which was reviewed periodically.  Reviewed and confirmed nursing documentation for past medical history, family history, social history.  Reassessment and Plan:   Also reviewed, no signs of DVT.  Lab work overall reassuring.  X-ray without fracture.  I suspect he has some cellulitis.  I do not see signs of abscess, sepsis or systemic infection.  Will treat with doxycycline.  I spoke with the friend that the patient states takes care of him Seth Brock.  Seth Brock is able to come take the patient, and I discussed with Seth Brock that the plan is for outpatient antibiotics and close follow-up with his PCP with return precautions.  With patient and Seth Brock were comfortable with this plan.  Seth Brock is able to help him follow-up and get his medications.   Patient's presentation is most consistent with acute complicated illness / injury requiring diagnostic workup.           Final Clinical Impression(s) / ED Diagnoses Final diagnoses:  Left leg cellulitis    Rx / DC Orders ED Discharge Orders          Ordered    doxycycline (VIBRAMYCIN) 100 MG capsule  2 times daily        12/13/23 Jeoffrey Massed, MD 12/13/23 1904

## 2023-12-23 ENCOUNTER — Emergency Department (HOSPITAL_COMMUNITY)
Admission: EM | Admit: 2023-12-23 | Discharge: 2023-12-23 | Discharge status: Against Medical Advice | Attending: Emergency Medicine | Admitting: Emergency Medicine

## 2023-12-23 DIAGNOSIS — Z5321 Procedure and treatment not carried out due to patient leaving prior to being seen by health care provider: Secondary | ICD-10-CM | POA: Diagnosis not present

## 2023-12-23 DIAGNOSIS — R569 Unspecified convulsions: Secondary | ICD-10-CM | POA: Diagnosis not present

## 2023-12-23 DIAGNOSIS — W19XXXA Unspecified fall, initial encounter: Secondary | ICD-10-CM | POA: Diagnosis not present

## 2023-12-23 DIAGNOSIS — I10 Essential (primary) hypertension: Secondary | ICD-10-CM | POA: Diagnosis not present

## 2023-12-23 DIAGNOSIS — F29 Unspecified psychosis not due to a substance or known physiological condition: Secondary | ICD-10-CM | POA: Diagnosis not present
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
## 2024-01-22 ENCOUNTER — Other Ambulatory Visit: Payer: Self-pay | Admitting: Neurology

## 2024-01-22 DIAGNOSIS — G40209 Localization-related (focal) (partial) symptomatic epilepsy and epileptic syndromes with complex partial seizures, not intractable, without status epilepticus: Secondary | ICD-10-CM

## 2024-01-22 NOTE — Telephone Encounter (Signed)
 Dispensed Days Supply Quantity Provider Pharmacy  LACOSAMIDE 150 MG TABLET 12/04/2023 30 120 each Levert Feinstein, MD [facility] [phone] - G...  LACOSAMIDE 150 MG TABLET 10/27/2023 30 120 each Levert Feinstein, MD [facility] [phone] - G...  LACOSAMIDE 150 MG TABLET 09/03/2023 30 120 each Levert Feinstein, MD [facility] [phone] - G...  LACOSAMIDE 150 MG TABLET 07/31/2023 30 120 each Levert Feinstein, MD [facility] [phone] - G...  LACOSAMIDE 150 MG TABLET 06/24/2023 30 120 each Levert Feinstein, MD [facility] [phone] - G...  LACOSAMIDE 150 MG TABLET 05/14/2023 30 120 each Levert Feinstein, MD [facility] [phone] - G...  LACOSAMIDE 150 MG TABLET 04/05/2023 30 120 each Levert Feinstein, MD [facility] [phone] - G...  LACOSAMIDE 150 MG TABLET 02/22/2023 30 120 each Levert Feinstein, MD [facility] [phone] - G...    Last visit 07/21/23 Next visit 02/19/24

## 2024-02-01 ENCOUNTER — Encounter: Payer: Self-pay | Admitting: Cardiovascular Disease

## 2024-02-01 NOTE — Progress Notes (Unsigned)
 Cardiology Office Note:    Date:  02/02/2024   ID:  Seth Brock, DOB 11-Jan-1961, MRN [id number]  PCP:  Senaida Dama, NP   [facility] Providers Cardiologist:  Yahel Fuston ( previous [place])   }    Referring MD: Senaida Dama, NP   Chief Complaint  Patient presents with   Atrial Flutter     History of Present Illness:    Seth Brock is a 63 y.o. male with a hx of  CAD, HLD, HTN, atrial fib He was previously seen by Dr. Filiberto Hug)   He is in atrial flutter today Hx of HTN.  Hx of cognitive impairment , / mental retardation lives in a group home  His CHADS2VASC score is low - he has not been on anticoagulation   Notes suggest that he should be on diltiazem CD2 140 mg a day. He says he has not been taking the green and white capsule for a while.  He thinks that the provider who gave that to him has passed away.    I have reassured him that the doctors that he is seen in the past are still living but not sure who would have given that information  Heart rate and blood pressure are little on the high side.  I am inclined to restart Cardizem CD 240 mg a day.  Given his low CHA2DS2-VASc score, I am not inclined to start anticoagulation.  February 02, 2024 [name] is seen for follow up of his CAD, HLD, HTN, atrial fib Previously seen by Dr. Constancia Delton his meds Avoids salt, No regular exercise     Past Medical History:  Diagnosis Date   Constipation    takes Miralax daily   Coronary artery disease    Epilepsy seizure, nonconvulsive, generalized (HCC)    FHx: atrial fibrillation    Hyperlipidemia    Hypertension    Irregular heart beat    Neurological impairment in adult    Seizures [facility])     Past Surgical History:  Procedure Laterality Date   None Listed     VAGUS NERVE STIMULATOR INSERTION Left 03/05/2016   Procedure: Left Vagal Nerve Stimulator Placement;  Surgeon: Augusto Blonder, MD;  Location: [facility];  Service: Neurosurgery;   Laterality: Left;  Left Vagal Nerve Stimulator placement   VAGUS NERVE STIMULATOR INSERTION Left 09/26/2020   Procedure: VAGAL NERVE STIMULATOR BATTERY REPLACEMENT;  Surgeon: Augusto Blonder, MD;  Location: [facility] OR;  Service: Neurosurgery;  Laterality: Left;  anterior    Current Medications: Current Meds  Medication Sig   atorvastatin (LIPITOR) 20 MG tablet Take 1 tablet (20 mg total) by mouth daily.   diltiazem (CARDIZEM CD) 240 MG 24 hr capsule TAKE 1 CAPSULE BY MOUTH EVERY DAY   divalproex (DEPAKOTE) 500 MG DR tablet Take 2 tablets (1,000 mg total) by mouth 2 (two) times daily.   Lacosamide 150 MG TABS TAKE 2 TABLETS BY MOUTH TWICE A DAY   levETIRAcetam (KEPPRA) 1000 MG tablet Take 2 tablets (2,000 mg total) by mouth 2 (two) times daily.   Multiple Vitamin (MULTIVITAMIN WITH MINERALS) TABS tablet Take 1 tablet by mouth daily.     Allergies:   Patient has no known allergies.   Social History   Socioeconomic History   Marital status: Single    Spouse name: Not on file   Number of children: 0   Years of education: Not on file   Highest education level: Not on file  Occupational History  Not on file  Tobacco Use   Smoking status: Every Day    Current packs/day: 0.50    Types: Cigarettes, Cigars    Passive exposure: Current   Smokeless tobacco: Never   Tobacco comments:    as a teenager, 12/09/22 smoking 10 cigarettes daily, 1 cigar once a week.    Vaping Use   Vaping status: Never Used  Substance and Sexual Activity   Alcohol use: Not Currently    Comment: occasionally   Drug use: No    Comment: patient quit 1993   Sexual activity: Not on file  Other Topics Concern   Not on file  Social History Narrative   11/23/18 Patient lives with friend       His mother passed away 07-13-15 from a stroke.   Patient does not work.    Patient drinks caffeine daily. (Tea)   Social Drivers of Health   Financial Resource Strain: Not on file  Food Insecurity: Food Insecurity Present  (10/12/2022)   Hunger Vital Sign    Worried About Running Out of Food in the Last Year: Never true    Ran Out of Food in the Last Year: Sometimes true  Transportation Needs: No Transportation Needs (10/12/2022)   PRAPARE - Administrator, Civil Service (Medical): No    Lack of Transportation (Non-Medical): No  Physical Activity: Not on file  Stress: Not on file  Social Connections: Not on file     Family History: The patient's family history is not on file.  ROS:   Please see the history of present illness.     All other systems reviewed and are negative.  EKGs/Labs/Other Studies Reviewed:    The following studies were reviewed today:   EKG:   EKG Interpretation Date/Time:  Monday February 02 2024 10:57:24 EDT Ventricular Rate:  90 PR Interval:    QRS Duration:  88 QT Interval:  376 QTC Calculation: 459 R Axis:   110  Text Interpretation: Atrial fibrillation Lateral infarct , age undetermined When compared with ECG of 22-Oct-2022 22:57, Atrial fibrillation has replaced Atrial flutter Vent. rate has increased BY  43 BPM QRS duration has decreased Lateral infarct is now Present Nonspecific T wave abnormality now evident in Inferior leads T wave inversion now evident in Lateral leads QT has lengthened Confirmed by Ahmad Alert ([id number]) on 02/02/2024 11:28:06 AM     Recent Labs: 11/19/2023: ALT 16; TSH 2.010 12/13/2023: BUN 11; Creatinine, Ser 0.87; Hemoglobin 11.9; Platelets 156; Potassium 4.6; Sodium 140  Recent Lipid Panel    Component Value Date/Time   CHOL 133 11/19/2023 1140   TRIG 216 (H) 11/19/2023 1140   HDL 28 (L) 11/19/2023 1140   CHOLHDL 4.8 11/19/2023 1140   CHOLHDL 10.2 (H) 11/27/2015 0916   VLDL 59 (H) 11/27/2015 0916   LDLCALC 69 11/19/2023 1140     Risk Assessment/Calculations:    CHA2DS2-VASc Score =    This indicates a  % annual risk of stroke. The patient's score is based upon:            Physical Exam:     Physical Exam: Blood  pressure 138/85, pulse 92, height 6' (1.829 m), weight 196 lb 6.4 oz (89.1 kg), SpO2 98%.       GEN:  Well nourished, well developed in no acute distress HEENT: Normal NECK: No JVD; No carotid bruits LYMPHATICS: No lymphadenopathy CARDIAC:irreg. Irreg  RESPIRATORY:  Clear to auscultation without rales, wheezing or rhonchi  ABDOMEN: Soft, non-tender,  non-distended MUSCULOSKELETAL:  No edema; No deformity  SKIN: Warm and dry NEUROLOGIC:  Alert and oriented x 3   ASSESSMENT:    1. Benign essential hypertension     PLAN:       Atrial fib:   stable ,  his CHADS2VASC is 1 ( HTN)  He is not on anticoagulation at this point   2.  Hypertension:   Well controlled continue current medications.            Medication Adjustments/Labs and Tests Ordered: Current medicines are reviewed at length with the patient today.  Concerns regarding medicines are outlined above.  Orders Placed This Encounter  Procedures   EKG 12-Lead   No orders of the defined types were placed in this encounter.   Patient Instructions  Medication Instructions:  Your physician recommends that you continue on your current medications as directed. Please refer to the Current Medication list given to you today.  *If you need a refill on your cardiac medications before your next appointment, please call your pharmacy*  Follow-Up: At [facility], you and your health needs are our priority.  As part of our continuing mission to provide you with exceptional heart care, we have created designated Provider Care Teams.  These Care Teams include your primary Cardiologist (physician) and Advanced Practice Providers (APPs -  Physician Assistants and Nurse Practitioners) who all work together to provide you with the care you need, when you need it.  Your next appointment:   1 year(s)  The format for your next appointment:   In Person  Provider:   Fransico Ivy, MD{  Other Instructions   1st  Floor: - Lobby - Registration  - Pharmacy  - Lab - Cafe  2nd Floor: - PV Lab - Diagnostic Testing (echo, CT, nuclear med)  3rd Floor: - Vacant  4th Floor: - TCTS (cardiothoracic surgery) - AFib Clinic - Structural Heart Clinic - Vascular Surgery  - Vascular Ultrasound  5th Floor: - HeartCare Cardiology (general and EP) - Clinical Pharmacy for coumadin, hypertension, lipid, weight-loss medications, and med management appointments    Valet parking services will be available as well.     Signed, Ahmad Alert, MD  02/02/2024 11:42 AM    [facility]

## 2024-02-02 ENCOUNTER — Encounter: Payer: Self-pay | Admitting: Cardiovascular Disease

## 2024-02-02 ENCOUNTER — Ambulatory Visit: Attending: Cardiovascular Disease | Admitting: Cardiovascular Disease

## 2024-02-02 VITALS — BP 138/85 | HR 92 | Ht 72.0 in | Wt 196.4 lb

## 2024-02-02 DIAGNOSIS — I10 Essential (primary) hypertension: Secondary | ICD-10-CM | POA: Diagnosis not present

## 2024-02-02 NOTE — Patient Instructions (Signed)
 Medication Instructions:  Your physician recommends that you continue on your current medications as directed. Please refer to the Current Medication list given to you today.  *If you need a refill on your cardiac medications before your next appointment, please call your pharmacy*  Follow-Up: At [facility], you and your health needs are our priority.  As part of our continuing mission to provide you with exceptional heart care, we have created designated Provider Care Teams.  These Care Teams include your primary Cardiologist (physician) and Advanced Practice Providers (APPs -  Physician Assistants and Nurse Practitioners) who all work together to provide you with the care you need, when you need it.  Your next appointment:   1 year(s)  The format for your next appointment:   In Person  Provider:   Fransico Ivy, MD{  Other Instructions   1st Floor: - Lobby - Registration  - Pharmacy  - Lab - Cafe  2nd Floor: - PV Lab - Diagnostic Testing (echo, CT, nuclear med)  3rd Floor: - Vacant  4th Floor: - TCTS (cardiothoracic surgery) - AFib Clinic - Structural Heart Clinic - Vascular Surgery  - Vascular Ultrasound  5th Floor: - HeartCare Cardiology (general and EP) - Clinical Pharmacy for coumadin, hypertension, lipid, weight-loss medications, and med management appointments    Valet parking services will be available as well.

## 2024-02-19 ENCOUNTER — Ambulatory Visit: Payer: Medicaid Other | Admitting: Neurology
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
## 2024-05-24 ENCOUNTER — Other Ambulatory Visit: Payer: Self-pay | Admitting: Neurology

## 2024-06-03 ENCOUNTER — Ambulatory Visit: Admitting: Neurology

## 2024-06-03 ENCOUNTER — Encounter: Payer: Self-pay | Admitting: Neurology

## 2024-06-03 VITALS — BP 183/80 | HR 56 | Ht 74.0 in | Wt 194.5 lb

## 2024-06-03 DIAGNOSIS — G40209 Localization-related (focal) (partial) symptomatic epilepsy and epileptic syndromes with complex partial seizures, not intractable, without status epilepticus: Secondary | ICD-10-CM | POA: Diagnosis not present

## 2024-06-03 DIAGNOSIS — Z9689 Presence of other specified functional implants: Secondary | ICD-10-CM | POA: Diagnosis not present

## 2024-06-03 DIAGNOSIS — Z79899 Other long term (current) drug therapy: Secondary | ICD-10-CM | POA: Diagnosis not present

## 2024-06-03 MED ORDER — LACOSAMIDE 150 MG PO TABS
2.0000 | ORAL_TABLET | Freq: Two times a day (BID) | ORAL | 3 refills | Status: AC
Start: 2024-06-03 — End: ?

## 2024-06-03 MED ORDER — LEVETIRACETAM 1000 MG PO TABS: 2000.0000 mg | ORAL_TABLET | Freq: Two times a day (BID) | ORAL | 4 refills | Status: AC

## 2024-06-03 MED ORDER — DIVALPROEX SODIUM 500 MG PO DR TAB: 1000.0000 mg | DELAYED_RELEASE_TABLET | Freq: Two times a day (BID) | ORAL | 3 refills | Status: AC
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
## 2024-06-04 LAB — CBC WITH DIFFERENTIAL/PLATELET
Basophils Absolute: 0.1 x10E3/uL (ref 0.0–0.2)
Basos: 2 %
EOS (ABSOLUTE): 0.3 x10E3/uL (ref 0.0–0.4)
Eos: 8 %
Hematocrit: 41 % (ref 37.5–51.0)
Hemoglobin: 12.6 g/dL — ABNORMAL LOW (ref 13.0–17.7)
Immature Grans (Abs): 0 x10E3/uL (ref 0.0–0.1)
Immature Granulocytes: 0 %
Lymphocytes Absolute: 1.4 x10E3/uL (ref 0.7–3.1)
Lymphs: 42 %
MCH: 25.2 pg — ABNORMAL LOW (ref 26.6–33.0)
MCHC: 30.7 g/dL — ABNORMAL LOW (ref 31.5–35.7)
MCV: 82 fL (ref 79–97)
Monocytes Absolute: 0.2 x10E3/uL (ref 0.1–0.9)
Monocytes: 6 %
Neutrophils Absolute: 1.4 x10E3/uL (ref 1.4–7.0)
Neutrophils: 42 %
Platelets: 202 x10E3/uL (ref 150–450)
RBC: 5 x10E6/uL (ref 4.14–5.80)
RDW: 15.1 % (ref 11.6–15.4)
WBC: 3.3 x10E3/uL — ABNORMAL LOW (ref 3.4–10.8)

## 2024-06-04 LAB — COMPREHENSIVE METABOLIC PANEL WITH GFR
ALT: 16 IU/L (ref 0–44)
AST: 27 IU/L (ref 0–40)
Albumin: 4.1 g/dL (ref 3.8–4.9)
Alkaline Phosphatase: 87 IU/L (ref 44–121)
BUN/Creatinine Ratio: 11 (ref 10–24)
BUN: 10 mg/dL (ref 8–27)
Bilirubin Total: 0.5 mg/dL (ref 0.0–1.2)
CO2: 19 mmol/L — ABNORMAL LOW (ref 20–29)
Calcium: 10.5 mg/dL — ABNORMAL HIGH (ref 8.6–10.2)
Chloride: 103 mmol/L (ref 96–106)
Creatinine, Ser: 0.93 mg/dL (ref 0.76–1.27)
Globulin, Total: 4.1 g/dL (ref 1.5–4.5)
Glucose: 90 mg/dL (ref 70–99)
Potassium: 4 mmol/L (ref 3.5–5.2)
Sodium: 135 mmol/L (ref 134–144)
Total Protein: 8.2 g/dL (ref 6.0–8.5)
eGFR: 94 mL/min/1.73 (ref >59)

## 2024-06-04 LAB — VALPROIC ACID LEVEL: Valproic Acid Lvl: <4 ug/mL — ABNORMAL LOW (ref 50–100)

## 2024-06-08 NOTE — Telephone Encounter (Signed)
 Please call for patient, better Seth Brock, his friend  --Decreased Depakote  level less than 4, emphasized importance of compliance with his medication, (he did run out of his medication prior to his office visit)  -- Slightly low WBC, hemoglobin is at his baseline level  --Rest of the laboratory evaluation showed no significant abnormality.

## 2024-06-08 NOTE — Telephone Encounter (Signed)
 Call to friend davis, he verbalized understanding. He stated patient did his his medication now and was taking it.

## 2024-07-07 ENCOUNTER — Other Ambulatory Visit: Payer: Self-pay

## 2024-07-07 ENCOUNTER — Encounter (HOSPITAL_COMMUNITY): Payer: Self-pay | Admitting: Emergency Medicine

## 2024-07-07 ENCOUNTER — Emergency Department (HOSPITAL_COMMUNITY)
Admission: EM | Admit: 2024-07-07 | Discharge: 2024-07-08 | Disposition: A | Discharge status: Home / Self Care | Attending: Emergency Medicine | Admitting: Emergency Medicine

## 2024-07-07 DIAGNOSIS — E11649 Type 2 diabetes mellitus with hypoglycemia without coma: Secondary | ICD-10-CM | POA: Diagnosis not present

## 2024-07-07 DIAGNOSIS — G40209 Localization-related (focal) (partial) symptomatic epilepsy and epileptic syndromes with complex partial seizures, not intractable, without status epilepticus: Secondary | ICD-10-CM

## 2024-07-07 DIAGNOSIS — F10920 Alcohol use, unspecified with intoxication, uncomplicated: Secondary | ICD-10-CM

## 2024-07-07 DIAGNOSIS — W19XXXA Unspecified fall, initial encounter: Secondary | ICD-10-CM | POA: Diagnosis not present

## 2024-07-07 DIAGNOSIS — E162 Hypoglycemia, unspecified: Secondary | ICD-10-CM

## 2024-07-07 DIAGNOSIS — R7401 Elevation of levels of liver transaminase levels: Secondary | ICD-10-CM

## 2024-07-07 DIAGNOSIS — G4089 Other seizures: Secondary | ICD-10-CM | POA: Insufficient documentation

## 2024-07-07 DIAGNOSIS — R569 Unspecified convulsions: Secondary | ICD-10-CM | POA: Diagnosis not present

## 2024-07-07 DIAGNOSIS — G40919 Epilepsy, unspecified, intractable, without status epilepticus: Secondary | ICD-10-CM

## 2024-07-07 DIAGNOSIS — R410 Disorientation, unspecified: Secondary | ICD-10-CM | POA: Diagnosis not present

## 2024-07-07 LAB — COMPREHENSIVE METABOLIC PANEL WITH GFR
ALT: 21 U/L (ref 0–44)
AST: 49 U/L — ABNORMAL HIGH (ref 15–41)
Albumin: 3.8 g/dL (ref 3.5–5.0)
Alkaline Phosphatase: 70 U/L (ref 38–126)
Anion gap: 12 (ref 5–15)
BUN: 8 mg/dL (ref 6–20)
CO2: 21 mmol/L — ABNORMAL LOW (ref 22–32)
Calcium: 10.3 mg/dL (ref 8.9–10.3)
Chloride: 108 mmol/L (ref 98–111)
Creatinine, Ser: 0.81 mg/dL (ref 0.61–1.24)
GFR, Estimated: >60 mL/min (ref >60)
Glucose, Bld: 67 mg/dL — ABNORMAL LOW (ref 70–99)
Potassium: 3.9 mmol/L (ref 3.5–5.1)
Sodium: 141 mmol/L (ref 135–145)
Total Bilirubin: 0.6 mg/dL (ref 0.0–1.2)
Total Protein: 8.2 g/dL — ABNORMAL HIGH (ref 6.5–8.1)

## 2024-07-07 LAB — CBC WITH DIFFERENTIAL/PLATELET
Abs Immature Granulocytes: 0.01 K/uL (ref 0.00–0.07)
Basophils Absolute: 0 K/uL (ref 0.0–0.1)
Basophils Relative: 1 %
Eosinophils Absolute: 0.2 K/uL (ref 0.0–0.5)
Eosinophils Relative: 5 %
HCT: 37.2 % — ABNORMAL LOW (ref 39.0–52.0)
Hemoglobin: 11.6 g/dL — ABNORMAL LOW (ref 13.0–17.0)
Immature Granulocytes: 0 %
Lymphocytes Relative: 37 %
Lymphs Abs: 1.4 K/uL (ref 0.7–4.0)
MCH: 23.5 pg — ABNORMAL LOW (ref 26.0–34.0)
MCHC: 31.2 g/dL (ref 30.0–36.0)
MCV: 75.3 fL — ABNORMAL LOW (ref 80.0–100.0)
Monocytes Absolute: 0.4 K/uL (ref 0.1–1.0)
Monocytes Relative: 11 %
Neutro Abs: 1.7 K/uL (ref 1.7–7.7)
Neutrophils Relative %: 46 %
Platelets: 121 K/uL — ABNORMAL LOW (ref 150–400)
RBC: 4.94 MIL/uL (ref 4.22–5.81)
RDW: 15.2 % (ref 11.5–15.5)
WBC: 3.8 K/uL — ABNORMAL LOW (ref 4.0–10.5)
nRBC: 0 % (ref 0.0–0.2)

## 2024-07-07 LAB — ETHANOL: Alcohol, Ethyl (B): 122 mg/dL — ABNORMAL HIGH (ref <15)

## 2024-07-07 LAB — CBG MONITORING, ED: Glucose-Capillary: 63 mg/dL — ABNORMAL LOW (ref 70–99)

## 2024-07-07 LAB — VALPROIC ACID LEVEL: Valproic Acid Lvl: 80 ug/mL (ref 50–100)

## 2024-07-07 MED ORDER — LEVETIRACETAM (KEPPRA) 500 MG/5 ML ADULT IV PUSH
2000.0000 mg | Freq: Once | INTRAVENOUS | Status: AC
  Administered 2024-07-07: 2000 mg via INTRAVENOUS
  Filled 2024-07-07: qty 20

## 2024-07-07 NOTE — Discharge Instructions (Addendum)
 You were seen in the emergency room for seizures. The workup in the emergency room is overall reassuring.  We have referred you to your outpatient neurologist for optimal management of your breakthrough seizures.  Please expect a call from your neurologist for a close follow-up appointment.  Continue to take your medications as prescribed.  Return to the ER if you have additional breakthrough episode of seizures.  [name] law prevents people with seizures or fainting from driving or operating dangerous machinery until they are free of seizures or fainting for 6 months.
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
## 2024-07-07 NOTE — ED Notes (Signed)
 Pt stuck multiple times by EMS.  Multiple attempts made in ED with success for IV.

## 2024-07-07 NOTE — ED Provider Notes (Signed)
 Care assumed from Dr. Charlyn, patient with seizure and therapeutic valproic  acid level. CMP is pending. Plan is for discharge if no further seizures and no significant lab abnormalities.  I have reviewed his laboratory tests, and my interpretation is mild hypoglycemia.  I doubt this is a trigger for his seizure.  There is also mild elevation of AST which I do not feel is clinically significant.  Ethanol level is in the range of legal intoxication.  I have ordered oral fluids and a meal for his hypoglycemia, plan to discharge with follow-up with his neurologist to decide if he needs to have his seizure medications adjusted.  Glucose has come up and has remained in an adequate level.  He is safe for discharge.  I am encouraging him to abstain from ethanol.  Results for orders placed or performed during the hospital encounter of 07/07/24  Valproic  acid level   Collection Time: 07/07/24  9:09 PM  Result Value Ref Range   Valproic  Acid Lvl 80 50 - 100 ug/mL  CBC with Differential/Platelet   Collection Time: 07/07/24 11:08 PM  Result Value Ref Range   WBC 3.8 (L) 4.0 - 10.5 K/uL   RBC 4.94 4.22 - 5.81 MIL/uL   Hemoglobin 11.6 (L) 13.0 - 17.0 g/dL   HCT 62.7 (L) 60.9 - 47.9 %   MCV 75.3 (L) 80.0 - 100.0 fL   MCH 23.5 (L) 26.0 - 34.0 pg   MCHC 31.2 30.0 - 36.0 g/dL   RDW 84.7 88.4 - 84.4 %   Platelets 121 (L) 150 - 400 K/uL   nRBC 0.0 0.0 - 0.2 %   Neutrophils Relative % 46 %   Neutro Abs 1.7 1.7 - 7.7 K/uL   Lymphocytes Relative 37 %   Lymphs Abs 1.4 0.7 - 4.0 K/uL   Monocytes Relative 11 %   Monocytes Absolute 0.4 0.1 - 1.0 K/uL   Eosinophils Relative 5 %   Eosinophils Absolute 0.2 0.0 - 0.5 K/uL   Basophils Relative 1 %   Basophils Absolute 0.0 0.0 - 0.1 K/uL   Immature Granulocytes 0 %   Abs Immature Granulocytes 0.01 0.00 - 0.07 K/uL  Comprehensive metabolic panel with GFR   Collection Time: 07/07/24 11:08 PM  Result Value Ref Range   Sodium 141 135 - 145 mmol/L   Potassium 3.9  3.5 - 5.1 mmol/L   Chloride 108 98 - 111 mmol/L   CO2 21 (L) 22 - 32 mmol/L   Glucose, Bld 67 (L) 70 - 99 mg/dL   BUN 8 6 - 20 mg/dL   Creatinine, Ser 9.18 0.61 - 1.24 mg/dL   Calcium  10.3 8.9 - 10.3 mg/dL   Total Protein 8.2 (H) 6.5 - 8.1 g/dL   Albumin 3.8 3.5 - 5.0 g/dL   AST 49 (H) 15 - 41 U/L   ALT 21 0 - 44 U/L   Alkaline Phosphatase 70 38 - 126 U/L   Total Bilirubin 0.6 0.0 - 1.2 mg/dL   GFR, Estimated >39 >39 mL/min   Anion gap 12 5 - 15  Ethanol   Collection Time: 07/07/24 11:08 PM  Result Value Ref Range   Alcohol, Ethyl (B) 122 (H) <15 mg/dL  CBG monitoring, ED   Collection Time: 07/07/24 11:56 PM  Result Value Ref Range   Glucose-Capillary 63 (L) 70 - 99 mg/dL      Raford Lenis, MD 90/81/74 [phone]

## 2024-07-07 NOTE — ED Provider Notes (Signed)
 [place] EMERGENCY DEPARTMENT AT [facility] Provider Note   CSN: [id number] Arrival date & time: 07/07/24  2003     Patient presents with: Seizures   Seth Brock is a 63 y.o. male.   HPI    Patient comes in with chief complaint of seizures. Patient has past medical history of intellectual delay, seizures, VNS stimulator. Patient states that he had a seizure today.  He denies any recent illnesses, nausea, vomiting, fevers, chills.  Patient states that he is compliant with his medications.  He is unsure when he had last seizure.  Prior to Admission medications   Medication Sig Start Date End Date Taking? Authorizing Provider  atorvastatin  (LIPITOR) 20 MG tablet Take 1 tablet (20 mg total) by mouth daily. 11/20/23   Lorren Greig PARAS, NP  diltiazem  (CARDIZEM  CD) 240 MG 24 hr capsule TAKE 1 CAPSULE BY MOUTH EVERY DAY 05/12/23   Nahser, Aleene PARAS, MD  divalproex  (DEPAKOTE ) 500 MG DR tablet Take 2 tablets (1,000 mg total) by mouth 2 (two) times daily. 06/03/24   Onita Duos, MD  doxycycline  (VIBRAMYCIN ) 100 MG capsule Take 1 capsule (100 mg total) by mouth 2 (two) times daily. 12/13/23   Davis, Jonathon H, MD  Lacosamide  150 MG TABS Take 2 tablets (300 mg total) by mouth 2 (two) times daily. 06/03/24   Onita Duos, MD  levETIRAcetam  (KEPPRA ) 1000 MG tablet Take 2 tablets (2,000 mg total) by mouth 2 (two) times daily. 06/03/24   Onita Duos, MD  Multiple Vitamin (MULTIVITAMIN WITH MINERALS) TABS tablet Take 1 tablet by mouth daily.    [provider]    Allergies: Patient has no known allergies.    Review of Systems  All other systems reviewed and are negative.   Updated Vital Signs BP (!) 167/87   Pulse 62   Temp 97.7 F (36.5 C) (Oral)   Resp 14   Ht 6' 2 (1.88 m)   Wt 88 kg   SpO2 100%   BMI 24.91 kg/m   Physical Exam Vitals and nursing note reviewed.  Constitutional:      Appearance: He is well-developed.  HENT:     Head: Atraumatic.  Cardiovascular:      Rate and Rhythm: Normal rate.  Pulmonary:     Effort: Pulmonary effort is normal.  Musculoskeletal:     Cervical back: Neck supple.  Skin:    General: Skin is warm.  Neurological:     Mental Status: He is alert and oriented to person, place, and time.     (all labs ordered are listed, but only abnormal results are displayed) Labs Reviewed  CBC WITH DIFFERENTIAL/PLATELET - Abnormal; Notable for the following components:      Result Value   WBC 3.8 (*)    Hemoglobin 11.6 (*)    HCT 37.2 (*)    MCV 75.3 (*)    MCH 23.5 (*)    Platelets 121 (*)    All other components within normal limits  COMPREHENSIVE METABOLIC PANEL WITH GFR - Abnormal; Notable for the following components:   CO2 21 (*)    Glucose, Bld 67 (*)    Total Protein 8.2 (*)    AST 49 (*)    All other components within normal limits  ETHANOL - Abnormal; Notable for the following components:   Alcohol, Ethyl (B) 122 (*)    All other components within normal limits  VALPROIC  ACID LEVEL  CBC WITH DIFFERENTIAL/PLATELET  CBG MONITORING, ED  EKG: EKG Interpretation Date/Time:  Wednesday July 07 2024 20:22:20 EDT Ventricular Rate:  89 PR Interval:    QRS Duration:  121 QT Interval:  402 QTC Calculation: 490 R Axis:   56  Text Interpretation: Atrial flutter with predominant 3:1 AV block Ventricular premature complex Nonspecific intraventricular conduction delay Borderline ST depression, inferior leads Borderline ST elevation, lateral leads Confirmed by Charlyn Sora [phone]) on 07/07/2024 11:29:36 PM  Radiology: No results found.   Procedures   Medications Ordered in the ED  levETIRAcetam  (KEPPRA ) undiluted injection 2,000 mg (2,000 mg Intravenous Given 07/07/24 2316)                                    Medical Decision Making Amount and/or Complexity of Data Reviewed Labs: ordered.   63 year old patient comes in with chief complaint of seizure-like activity. Patient has pertinent past  medical history of epilepsy, VNS stimulator. Collateral history provided by reviewing patient's notes, including neurology note from August 2025.  It seems like adequate medications were prescribed during that visit.  Differential diagnosis considered for this patient includes -Seizure disorder with breakthrough seizure - Infection including meningitis and encephalitis -Traumatic brain injury including intracranial hemorrhage -Severe electrolyte abnormality -Metabolic derangement -Stroke -Toxin induced seizures -Medication side effects -Hypoxia -Hypoglycemia - Withdrawal seizure  Initial plan is to get basic labs, initiate seizure precautions and monitor the patient in the ER for extended period of time. Additionally, plan is to put in a referral for neurology follow-up.  I do not think we will need to adjust any medications.  Depakote  level was ordered and is normal.  Previously, his Keppra  levels were normal per PCP.  Will give him 2 g of Keppra  while he is here.  If there is no additional episodes of seizure, then patient can be discharged.  His care has been signed out to incoming team.  Patient is moving all 4 extremities, providing meaningful history, I do not think CT brain is indicated.   Final diagnoses:  Breakthrough seizure [facility])    ED Discharge Orders          Ordered    Ambulatory referral to Neurology       Comments: An appointment is requested in approximately: 1 week   07/07/24 2352               Charlyn Sora, MD 07/07/24 2357

## 2024-07-08 LAB — CBG MONITORING, ED
Glucose-Capillary: 63 mg/dL — ABNORMAL LOW (ref 70–99)
Glucose-Capillary: 135 mg/dL — ABNORMAL HIGH (ref 70–99)

## 2024-07-08 NOTE — ED Notes (Signed)
Pt given 8 oz orange juice.  

## 2024-07-08 NOTE — ED Notes (Signed)
 Pt given sandwich

## 2024-07-08 NOTE — ED Notes (Signed)
 [facility] called to transport patient for discharge.

## 2024-07-13 ENCOUNTER — Encounter: Payer: Self-pay | Admitting: Neurology

## 2024-07-13 ENCOUNTER — Ambulatory Visit: Admitting: Neurology

## 2024-07-13 VITALS — BP 150/94 | HR 72 | Ht 72.0 in | Wt 187.0 lb

## 2024-07-13 DIAGNOSIS — Z9689 Presence of other specified functional implants: Secondary | ICD-10-CM | POA: Diagnosis not present

## 2024-07-13 DIAGNOSIS — G40209 Localization-related (focal) (partial) symptomatic epilepsy and epileptic syndromes with complex partial seizures, not intractable, without status epilepticus: Secondary | ICD-10-CM

## 2024-07-13 DIAGNOSIS — Z79899 Other long term (current) drug therapy: Secondary | ICD-10-CM | POA: Diagnosis not present

## 2024-07-13 DIAGNOSIS — R569 Unspecified convulsions: Secondary | ICD-10-CM
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
## 2024-07-13 NOTE — Addendum Note (Signed)
 Addended by: Ayiden Milliman on: 07/13/2024 11:34 AM   Modules accepted: Level of Service

## 2024-07-16 LAB — LACOSAMIDE: Lacosamide: 5.6 ug/mL (ref 5.0–10.0)

## 2024-07-16 LAB — LEVETIRACETAM LEVEL: Levetiracetam Lvl: 66 ug/mL — ABNORMAL HIGH (ref 10.0–40.0)

## 2024-07-17 ENCOUNTER — Ambulatory Visit: Payer: Self-pay | Admitting: Neurology

## 2024-08-11 ENCOUNTER — Other Ambulatory Visit: Payer: Self-pay

## 2024-08-11 ENCOUNTER — Emergency Department (HOSPITAL_COMMUNITY)
Admission: EM | Admit: 2024-08-11 | Discharge: 2024-08-11 | Disposition: A | Discharge status: Home / Self Care | Attending: Emergency Medicine | Admitting: Emergency Medicine

## 2024-08-11 ENCOUNTER — Emergency Department (HOSPITAL_COMMUNITY)

## 2024-08-11 ENCOUNTER — Encounter (HOSPITAL_COMMUNITY): Payer: Self-pay | Admitting: Emergency Medicine

## 2024-08-11 DIAGNOSIS — I2510 Atherosclerotic heart disease of native coronary artery without angina pectoris: Secondary | ICD-10-CM | POA: Diagnosis not present

## 2024-08-11 DIAGNOSIS — R4182 Altered mental status, unspecified: Secondary | ICD-10-CM | POA: Diagnosis not present

## 2024-08-11 DIAGNOSIS — E860 Dehydration: Secondary | ICD-10-CM | POA: Insufficient documentation

## 2024-08-11 DIAGNOSIS — I10 Essential (primary) hypertension: Secondary | ICD-10-CM | POA: Diagnosis not present

## 2024-08-11 DIAGNOSIS — I499 Cardiac arrhythmia, unspecified: Secondary | ICD-10-CM | POA: Diagnosis not present

## 2024-08-11 DIAGNOSIS — Z79899 Other long term (current) drug therapy: Secondary | ICD-10-CM | POA: Diagnosis not present

## 2024-08-11 DIAGNOSIS — R404 Transient alteration of awareness: Secondary | ICD-10-CM | POA: Diagnosis not present

## 2024-08-11 DIAGNOSIS — R001 Bradycardia, unspecified: Secondary | ICD-10-CM | POA: Diagnosis not present

## 2024-08-11 DIAGNOSIS — R569 Unspecified convulsions: Secondary | ICD-10-CM | POA: Diagnosis not present

## 2024-08-11 DIAGNOSIS — W19XXXA Unspecified fall, initial encounter: Secondary | ICD-10-CM | POA: Diagnosis not present

## 2024-08-11 LAB — COMPREHENSIVE METABOLIC PANEL WITH GFR
ALT: 15 U/L (ref 0–44)
AST: 23 U/L (ref 15–41)
Albumin: 3 g/dL — ABNORMAL LOW (ref 3.5–5.0)
Alkaline Phosphatase: 48 U/L (ref 38–126)
Anion gap: 9 (ref 5–15)
BUN: 12 mg/dL (ref 6–20)
CO2: 24 mmol/L (ref 22–32)
Calcium: 9.6 mg/dL (ref 8.9–10.3)
Chloride: 101 mmol/L (ref 98–111)
Creatinine, Ser: 1.09 mg/dL (ref 0.61–1.24)
GFR, Estimated: >60 mL/min (ref >60)
Glucose, Bld: 87 mg/dL (ref 70–99)
Potassium: 3.9 mmol/L (ref 3.5–5.1)
Sodium: 134 mmol/L — ABNORMAL LOW (ref 135–145)
Total Bilirubin: 0.3 mg/dL (ref 0.0–1.2)
Total Protein: 7.3 g/dL (ref 6.5–8.1)

## 2024-08-11 LAB — CBC
HCT: 34.7 % — ABNORMAL LOW (ref 39.0–52.0)
Hemoglobin: 11.2 g/dL — ABNORMAL LOW (ref 13.0–17.0)
MCH: 24.2 pg — ABNORMAL LOW (ref 26.0–34.0)
MCHC: 32.3 g/dL (ref 30.0–36.0)
MCV: 74.9 fL — ABNORMAL LOW (ref 80.0–100.0)
Platelets: 180 K/uL (ref 150–400)
RBC: 4.63 MIL/uL (ref 4.22–5.81)
RDW: 16.2 % — ABNORMAL HIGH (ref 11.5–15.5)
WBC: 3.1 K/uL — ABNORMAL LOW (ref 4.0–10.5)
nRBC: 0 % (ref 0.0–0.2)

## 2024-08-11 LAB — ETHANOL: Alcohol, Ethyl (B): <15 mg/dL (ref <15)

## 2024-08-11 LAB — CBG MONITORING, ED: Glucose-Capillary: 77 mg/dL (ref 70–99)

## 2024-08-11 LAB — VALPROIC ACID LEVEL: Valproic Acid Lvl: 26 ug/mL — ABNORMAL LOW (ref 50–100)

## 2024-08-11 MED ORDER — SODIUM CHLORIDE 0.9 % IV BOLUS
1000.0000 mL | Freq: Once | INTRAVENOUS | Status: AC
  Administered 2024-08-11: 1000 mL via INTRAVENOUS
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
## 2024-08-11 NOTE — ED Provider Notes (Signed)
 [place] EMERGENCY DEPARTMENT AT [facility] Provider Note   CSN: [id number] Arrival date & time: 08/11/24  1540     Patient presents with: Altered Mental Status   Seth Brock is a 63 y.o. male.    Seizures Seizure activity on arrival: no   Seizure type:  Unable to specify Initial focality:  None Episode characteristics: unresponsiveness   Postictal symptoms: somnolence   Return to baseline: yes   Severity:  Unable to specify Timing:  Once Progression:  Unchanged Context: not alcohol withdrawal   Recent head injury:  No recent head injuries PTA treatment:  None History of seizures: yes        Prior to Admission medications   Medication Sig Start Date End Date Taking? Authorizing Provider  atorvastatin  (LIPITOR) 20 MG tablet Take 1 tablet (20 mg total) by mouth daily. 11/20/23   Lorren Greig PARAS, NP  diltiazem  (CARDIZEM  CD) 240 MG 24 hr capsule TAKE 1 CAPSULE BY MOUTH EVERY DAY 05/12/23   Nahser, Aleene PARAS, MD  divalproex  (DEPAKOTE ) 500 MG DR tablet Take 2 tablets (1,000 mg total) by mouth 2 (two) times daily. 06/03/24   Onita Duos, MD  Lacosamide  150 MG TABS Take 2 tablets (300 mg total) by mouth 2 (two) times daily. 06/03/24   Onita Duos, MD  levETIRAcetam  (KEPPRA ) 1000 MG tablet Take 2 tablets (2,000 mg total) by mouth 2 (two) times daily. 06/03/24   Onita Duos, MD  Multiple Vitamin (MULTIVITAMIN WITH MINERALS) TABS tablet Take 1 tablet by mouth daily.    [provider]    Allergies: Patient has no known allergies.    Review of Systems  Constitutional:  Negative for chills, fatigue and fever.  HENT:  Negative for congestion.   Eyes:  Negative for pain and visual disturbance.  Respiratory:  Negative for cough, chest tightness, shortness of breath and wheezing.   Cardiovascular:  Negative for chest pain, palpitations and leg swelling.  Gastrointestinal:  Negative for abdominal pain, constipation, diarrhea, nausea and vomiting.  Genitourinary:   Negative for flank pain.  Musculoskeletal:  Negative for back pain, neck pain and neck stiffness.  Skin:  Negative for rash and wound.  Neurological:  Positive for seizures. Negative for dizziness, speech difficulty, weakness, light-headedness, numbness and headaches.  Psychiatric/Behavioral:  Negative for agitation and confusion.   All other systems reviewed and are negative.   Updated Vital Signs BP (!) 164/77   Pulse (!) 53   Resp 17   SpO2 100%   Physical Exam Vitals and nursing note reviewed.  Constitutional:      General: He is not in acute distress.    Appearance: He is well-developed. He is not ill-appearing, toxic-appearing or diaphoretic.  HENT:     Head: Normocephalic and atraumatic.     Nose: No congestion or rhinorrhea.     Mouth/Throat:     Mouth: Mucous membranes are dry.     Pharynx: No oropharyngeal exudate or posterior oropharyngeal erythema.  Eyes:     Extraocular Movements: Extraocular movements intact.     Conjunctiva/sclera: Conjunctivae normal.     Pupils: Pupils are equal, round, and reactive to light.  Cardiovascular:     Rate and Rhythm: Normal rate and regular rhythm.     Heart sounds: No murmur heard. Pulmonary:     Effort: Pulmonary effort is normal. No respiratory distress.     Breath sounds: Normal breath sounds. No wheezing, rhonchi or rales.  Chest:     Chest wall: No  tenderness.  Abdominal:     Palpations: Abdomen is soft.     Tenderness: There is no abdominal tenderness. There is no right CVA tenderness, left CVA tenderness, guarding or rebound.  Musculoskeletal:        General: No swelling or tenderness.     Cervical back: Neck supple. No tenderness.  Skin:    General: Skin is warm and dry.     Capillary Refill: Capillary refill takes less than 2 seconds.     Findings: No erythema or rash.  Neurological:     General: No focal deficit present.     Mental Status: He is alert.     Cranial Nerves: No cranial nerve deficit.      Sensory: No sensory deficit.     Motor: No weakness.  Psychiatric:        Mood and Affect: Mood normal.     (all labs ordered are listed, but only abnormal results are displayed) Labs Reviewed  COMPREHENSIVE METABOLIC PANEL WITH GFR - Abnormal; Notable for the following components:      Result Value   Sodium 134 (*)    Albumin 3.0 (*)    All other components within normal limits  CBC - Abnormal; Notable for the following components:   WBC 3.1 (*)    Hemoglobin 11.2 (*)    HCT 34.7 (*)    MCV 74.9 (*)    MCH 24.2 (*)    RDW 16.2 (*)    All other components within normal limits  VALPROIC  ACID LEVEL - Abnormal; Notable for the following components:   Valproic  Acid Lvl 26 (*)    All other components within normal limits  ETHANOL  LEVETIRACETAM  LEVEL  CBG MONITORING, ED    EKG: EKG Interpretation Date/Time:  Wednesday August 11 2024 15:53:46 EDT Ventricular Rate:  53 PR Interval:  144 QRS Duration:  90 QT Interval:  472 QTC Calculation: 444 R Axis:   65  Text Interpretation: Sinus rhythm Consider right atrial enlargement Consider left ventricular hypertrophy ST elevation, consider anterior injury when compared to prior, now sinus rhythm No STEMI Confirmed by Ginger Barefoot ([id number]) on 08/11/2024 4:10:49 PM  Radiology: CT HEAD WO CONTRAST Result Date: 08/11/2024 EXAM: CT HEAD WITHOUT CONTRAST 08/11/2024 04:12:00 PM TECHNIQUE: CT of the head was performed without the administration of intravenous contrast. Automated exposure control, iterative reconstruction, and/or weight based adjustment of the mA/kV was utilized to reduce the radiation dose to as low as reasonably achievable. COMPARISON: Head CT 10/12/2022 CLINICAL HISTORY: Mental status change, unknown cause. FINDINGS: BRAIN AND VENTRICLES: There is no evidence of an acute infarct, intracranial hemorrhage, mass, midline shift, hydrocephalus, or extra-axial fluid collection. Scattered cerebral white matter hypodensities are  similar to the prior CT and nonspecific but compatible with mild chronic small vessel ischemic disease. There is moderately advanced cerebellar atrophy. Supratentorial brain volume is within normal limits for age. Calcified atherosclerosis at the skull base. ORBITS: No acute abnormality. SINUSES: No acute abnormality. SOFT TISSUES AND SKULL: No acute soft tissue abnormality. No skull fracture. IMPRESSION: 1. No acute intracranial abnormality. 2. Cerebellar atrophy. 3. Mild chronic small vessel ischemic disease. Electronically signed by: Dasie Hamburg MD 08/11/2024 04:20 PM EDT RP Workstation: [id number]     Procedures   Medications Ordered in the ED  sodium chloride  0.9 % bolus 1,000 mL (0 mLs Intravenous Stopped 08/11/24 1721)  Medical Decision Making Amount and/or Complexity of Data Reviewed Labs: ordered. Radiology: ordered.    Seth Brock is a 63 y.o. 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  According to EMS, patient was found laying on the side road with a bag of alcohol with him.  Otherwise they report when they got to him he was mentating well and answering questions.  He says he thinks he had a seizure.  They report his glucose was not low and he feels back to his baseline now.  Patient tells me that he drinks some alcohol on and off but has not changed his amount that he drinks.  He denies any headache, neck pain, chest pain, back pain or abdominal pain.  Denies pain in extremities.  He did report some scrapes on his left hand but has no significant discomfort.  Reports he is sleeping well.  Reports that he takes all of his medications including his Keppra  and Depakote .  He reports his last seizure was about a month ago and  was pretty similar.  He reports he usually drops out when he has his seizures and thinks that is what happened today.  Patient resting comfortable without distress and has no complaints on arrival.  On exam, lungs clear.  Chest nontender.  Abdomen nontender.  No evidence of head trauma.  Pupil symmetric and reactive with normal extract movements.  Symmetric smile.  Clear speech.  Normal finger-nose-finger testing.  Intact sensation and strength in extremities.  Patient has no acute distress.  Patient has scrapes on his left hand that he is not concerned about.  No tenderness on my exam.  Due to the suspected recurrent seizure, we will get CT head and basic labs.  With the bag of alcohol with him we will check an EtOH level.  Will check levels of his Keppra  and Depakote  as he reports he does take them.  Will give some fluids as he had some dry mucous membranes on exam.  If workup reassuring, anticipate discharge for outpatient neurology follow-up if no concerning findings are seen.  EKG on arrival does not show STEMI Ennever show sinus rhythm instead of previous A-fib.  Workup overall reassuring.  His labs are similar to prior overall and CT head did not show acute abnormality compared to prior.  Valproic  acid slightly low and Keppra  level still in process however patient tells me he takes his medications.  He will take them tonight.  EtOH undetectable.  After several hours patient continued to feel well and would like to go home.  He feels comfortable taking his home medicines and following up.  He says he was possibly dehydrated and had a breakthrough seizure that he has every now and then.  He will be careful and follow-up with his primary team.  He had no other questions or concerns and will be discharged for outpatient follow-up.      Final diagnoses:  Dehydration  Altered mental status, unspecified altered mental status type    ED Discharge Orders     None       Clinical  Impression: 1. Dehydration   2. Altered mental status, unspecified altered mental status type     Disposition: Discharge  Condition: Good  I have discussed the results, Dx and Tx plan with the pt(& family if present). He/she/they expressed understanding  and agree(s) with the plan. Discharge instructions discussed at great length. Strict return precautions discussed and pt &/or family have verbalized understanding of the instructions. No further questions at time of discharge.    New Prescriptions   No medications on file    Follow Up: Lorren Greig PARAS, NP [address] [place] [address] [phone]     [facility] Emergency Department at [facility] [address] [place]  [place]  [address] [phone]         Diyana Starrett, Lonni PARAS, MD 08/11/24 [facility]

## 2024-08-11 NOTE — Discharge Instructions (Signed)
 Your history, exam, and workup today seem consistent with a recurrent seizure possibly due to recent alcohol use.  Your  CT of the head did not show acute traumatic injury or other significant findings.  Your labs were overall reassuring.  We gave you some fluids and you appeared well and did not have another seizure over the next 4 hours.  We had a shared decision-making conversation about further workup but you would like to go home.  Please continue your seizure medicines rest and stay hydrated if any symptoms change or worsen return to the nearest emergency department.

## 2024-08-14 LAB — LEVETIRACETAM LEVEL: Levetiracetam Lvl: 35.9 ug/mL (ref 10.0–40.0)

## 2024-08-22 ENCOUNTER — Encounter (HOSPITAL_COMMUNITY): Payer: Self-pay

## 2024-08-22 ENCOUNTER — Emergency Department (HOSPITAL_COMMUNITY)
Admission: EM | Admit: 2024-08-22 | Discharge: 2024-08-22 | Disposition: A | Discharge status: Home / Self Care | Attending: Emergency Medicine | Admitting: Emergency Medicine

## 2024-08-22 DIAGNOSIS — W19XXXA Unspecified fall, initial encounter: Secondary | ICD-10-CM | POA: Diagnosis not present

## 2024-08-22 DIAGNOSIS — R569 Unspecified convulsions: Secondary | ICD-10-CM

## 2024-08-22 DIAGNOSIS — G40909 Epilepsy, unspecified, not intractable, without status epilepticus: Secondary | ICD-10-CM | POA: Diagnosis not present

## 2024-08-22 DIAGNOSIS — S0990XA Unspecified injury of head, initial encounter: Secondary | ICD-10-CM | POA: Diagnosis not present

## 2024-08-22 DIAGNOSIS — I491 Atrial premature depolarization: Secondary | ICD-10-CM | POA: Diagnosis not present

## 2024-08-22 DIAGNOSIS — R404 Transient alteration of awareness: Secondary | ICD-10-CM | POA: Diagnosis not present

## 2024-08-22 DIAGNOSIS — I499 Cardiac arrhythmia, unspecified: Secondary | ICD-10-CM | POA: Diagnosis not present

## 2024-08-22 LAB — I-STAT CHEM 8, ED
BUN: 7 mg/dL (ref 6–20)
Calcium, Ion: 1.23 mmol/L (ref 1.15–1.40)
Chloride: 103 mmol/L (ref 98–111)
Creatinine, Ser: 1 mg/dL (ref 0.61–1.24)
Glucose, Bld: 88 mg/dL (ref 70–99)
HCT: 45 % (ref 39.0–52.0)
Hemoglobin: 15.3 g/dL (ref 13.0–17.0)
Potassium: 4.3 mmol/L (ref 3.5–5.1)
Sodium: 140 mmol/L (ref 135–145)
TCO2: 28 mmol/L (ref 22–32)

## 2024-08-22 NOTE — ED Triage Notes (Signed)
 BIB EMS from home r/t Witnessed Seizure while cooking. Seizure lasted ~2-13mins. Hit head. Denies thinners. A&Ox3. C-collar in place.

## 2024-08-22 NOTE — Discharge Instructions (Signed)
 Please take your medications when you get home. Return if you are having any new or worsening symptoms Follow-up with your doctor this week

## 2024-08-22 NOTE — ED Provider Notes (Signed)
 [place] EMERGENCY DEPARTMENT AT [facility] Provider Note   CSN: [id number] Arrival date & time: 08/22/24  1045     Patient presents with: Seizures   Seth Brock is a 63 y.o. male.   HPI 63 year old male history of seizure disorder presents today with report of grand mal seizure.  Roommate told EMS that he had a 2 to 3-minute seizure and fell and struck his head.  Patient is not on any blood thinners.  He currently appears to be back to baseline.  He denies any pain.  He reports taking medication as prescribed.  He also endorses smoking weed and drinking alcohol but denies any recently.    Prior to Admission medications   Medication Sig Start Date End Date Taking? Authorizing Provider  atorvastatin  (LIPITOR) 20 MG tablet Take 1 tablet (20 mg total) by mouth daily. 11/20/23   Lorren Greig PARAS, NP  diltiazem  (CARDIZEM  CD) 240 MG 24 hr capsule TAKE 1 CAPSULE BY MOUTH EVERY DAY 05/12/23   Nahser, Aleene PARAS, MD  divalproex  (DEPAKOTE ) 500 MG DR tablet Take 2 tablets (1,000 mg total) by mouth 2 (two) times daily. 06/03/24   Onita Duos, MD  Lacosamide  150 MG TABS Take 2 tablets (300 mg total) by mouth 2 (two) times daily. 06/03/24   Onita Duos, MD  levETIRAcetam  (KEPPRA ) 1000 MG tablet Take 2 tablets (2,000 mg total) by mouth 2 (two) times daily. 06/03/24   Onita Duos, MD  Multiple Vitamin (MULTIVITAMIN WITH MINERALS) TABS tablet Take 1 tablet by mouth daily.    [provider]    Allergies: Patient has no known allergies.    Review of Systems  Updated Vital Signs BP (!) 185/82   Pulse (!) 55   Temp 98.2 F (36.8 C) (Oral)   Resp 16   Wt 113.4 kg   SpO2 100%   BMI 33.91 kg/m   Physical Exam Vitals reviewed.  HENT:     Head: Normocephalic.     Right Ear: External ear normal.     Left Ear: External ear normal.     Nose: Nose normal.     Mouth/Throat:     Pharynx: Oropharynx is clear.  Eyes:     Pupils: Pupils are equal, round, and reactive to light.   Cardiovascular:     Rate and Rhythm: Normal rate and regular rhythm.     Pulses: Normal pulses.  Pulmonary:     Effort: Pulmonary effort is normal.  Abdominal:     General: Abdomen is flat.     Palpations: Abdomen is soft.  Musculoskeletal:        General: Normal range of motion.     Cervical back: Normal range of motion and neck supple. No tenderness.  Skin:    General: Skin is warm and dry.     Capillary Refill: Capillary refill takes less than 2 seconds.  Neurological:     General: No focal deficit present.     Mental Status: He is alert.  Psychiatric:        Mood and Affect: Mood normal.     (all labs ordered are listed, but only abnormal results are displayed) Labs Reviewed  I-STAT CHEM 8, ED    EKG: None  Radiology: No results found.   Procedures   Medications Ordered in the ED - No data to display  Clinical Course as of 08/22/24 1222  Sun Aug 22, 2024  1210 I-STAT reviewed interpreted and within normal limits [DR]  Clinical Course User Index [DR] Levander Houston, MD                                 Medical Decision Making  63 year old male history of seizures presents today with report of grand mal seizure.  Patient appears to be back to baseline.  He denies any pain.  No obvious external injury. Differential diagnosis includes but is not limited to syncope-doubt syncope patient is back to baseline now but had episode of postictal and was witnessed having shaking Hypoglycemia, other electrolyte abnormalities-Will check i-STAT but patient back to baseline without intervention Suspect this was seizure with patient with known seizure disorder and typical stigmata of seizure. Patient is having no pain do not think imaging indicated Will check i-STAT and discharge if stable Patient has remained awake and alert.  I-STAT is normal. Patient is hypertensive but has not taken his medications today.  He is not having any chest pain, dyspnea, and will take his  medications after going home. We discussed return precautions, taking medications appropriately, and need for follow-up and he voices understanding.    Final diagnoses:  Seizure [facility])  Seizure disorder [facility])    ED Discharge Orders     None          Levander Houston, MD 08/22/24 1222

## 2024-11-10 ENCOUNTER — Telehealth: Payer: Self-pay | Admitting: Neurology

## 2024-11-10 NOTE — Telephone Encounter (Signed)
 MYC cxl

## 2025-06-09 ENCOUNTER — Ambulatory Visit: Admitting: Neurology
